# Patient Record
Sex: Female | Born: 1955 | Race: White | Hispanic: No | Marital: Married | Smoking: Former smoker
Health system: Southern US, Community
[De-identification: ages and names within clinical notes are randomized; demographics above are authoritative.]

## PROBLEM LIST (undated history)

## (undated) DIAGNOSIS — I739 Peripheral vascular disease, unspecified: Secondary | ICD-10-CM

## (undated) DIAGNOSIS — T7840XA Allergy, unspecified, initial encounter: Secondary | ICD-10-CM

## (undated) DIAGNOSIS — M069 Rheumatoid arthritis, unspecified: Secondary | ICD-10-CM

## (undated) DIAGNOSIS — F32A Depression, unspecified: Secondary | ICD-10-CM

## (undated) DIAGNOSIS — M810 Age-related osteoporosis without current pathological fracture: Secondary | ICD-10-CM

## (undated) DIAGNOSIS — F419 Anxiety disorder, unspecified: Secondary | ICD-10-CM

## (undated) DIAGNOSIS — J439 Emphysema, unspecified: Secondary | ICD-10-CM

## (undated) DIAGNOSIS — I10 Essential (primary) hypertension: Secondary | ICD-10-CM

## (undated) DIAGNOSIS — J449 Chronic obstructive pulmonary disease, unspecified: Secondary | ICD-10-CM

## (undated) DIAGNOSIS — G709 Myoneural disorder, unspecified: Secondary | ICD-10-CM

## (undated) DIAGNOSIS — T8859XA Other complications of anesthesia, initial encounter: Secondary | ICD-10-CM

## (undated) DIAGNOSIS — K219 Gastro-esophageal reflux disease without esophagitis: Secondary | ICD-10-CM

## (undated) DIAGNOSIS — E785 Hyperlipidemia, unspecified: Secondary | ICD-10-CM

## (undated) DIAGNOSIS — E039 Hypothyroidism, unspecified: Secondary | ICD-10-CM

## (undated) DIAGNOSIS — R251 Tremor, unspecified: Secondary | ICD-10-CM

## (undated) DIAGNOSIS — D689 Coagulation defect, unspecified: Secondary | ICD-10-CM

## (undated) DIAGNOSIS — F329 Major depressive disorder, single episode, unspecified: Secondary | ICD-10-CM

## (undated) HISTORY — DX: Depression, unspecified: F32.A

## (undated) HISTORY — DX: Chronic obstructive pulmonary disease, unspecified: J44.9

## (undated) HISTORY — PX: EYE SURGERY: SHX253

## (undated) HISTORY — DX: Emphysema, unspecified: J43.9

## (undated) HISTORY — DX: Hypothyroidism, unspecified: E03.9

## (undated) HISTORY — PX: KIDNEY STONE SURGERY: SHX686

## (undated) HISTORY — DX: Allergy, unspecified, initial encounter: T78.40XA

## (undated) HISTORY — DX: Hyperlipidemia, unspecified: E78.5

## (undated) HISTORY — DX: Rheumatoid arthritis, unspecified: M06.9

## (undated) HISTORY — DX: Coagulation defect, unspecified: D68.9

## (undated) HISTORY — PX: CATARACT EXTRACTION, BILATERAL: SHX1313

## (undated) HISTORY — DX: Anxiety disorder, unspecified: F41.9

## (undated) HISTORY — DX: Myoneural disorder, unspecified: G70.9

## (undated) HISTORY — DX: Essential (primary) hypertension: I10

## (undated) HISTORY — DX: Gastro-esophageal reflux disease without esophagitis: K21.9

## (undated) HISTORY — PX: KIDNEY CYST REMOVAL: SHX684

## (undated) HISTORY — DX: Age-related osteoporosis without current pathological fracture: M81.0

---
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
## 2007-05-24 DIAGNOSIS — C3490 Malignant neoplasm of unspecified part of unspecified bronchus or lung: Secondary | ICD-10-CM

## 2007-05-24 HISTORY — DX: Malignant neoplasm of unspecified part of unspecified bronchus or lung: C34.90

## 2007-12-23 DIAGNOSIS — C3490 Malignant neoplasm of unspecified part of unspecified bronchus or lung: Secondary | ICD-10-CM | POA: Insufficient documentation

## 2008-07-22 DIAGNOSIS — E559 Vitamin D deficiency, unspecified: Secondary | ICD-10-CM | POA: Insufficient documentation

## 2008-09-03 DIAGNOSIS — I6529 Occlusion and stenosis of unspecified carotid artery: Secondary | ICD-10-CM | POA: Insufficient documentation

## 2010-06-25 DIAGNOSIS — N200 Calculus of kidney: Secondary | ICD-10-CM | POA: Insufficient documentation

## 2012-02-21 DIAGNOSIS — G250 Essential tremor: Secondary | ICD-10-CM | POA: Insufficient documentation

## 2015-05-24 HISTORY — PX: FEMORAL BYPASS: SHX50

## 2016-10-19 DIAGNOSIS — I739 Peripheral vascular disease, unspecified: Secondary | ICD-10-CM | POA: Insufficient documentation

## 2017-02-10 DIAGNOSIS — I70229 Atherosclerosis of native arteries of extremities with rest pain, unspecified extremity: Secondary | ICD-10-CM | POA: Insufficient documentation

## 2018-01-02 DIAGNOSIS — M2550 Pain in unspecified joint: Secondary | ICD-10-CM | POA: Insufficient documentation

## 2018-05-07 DIAGNOSIS — M159 Polyosteoarthritis, unspecified: Secondary | ICD-10-CM | POA: Insufficient documentation

## 2019-03-12 ENCOUNTER — Encounter (HOSPITAL_COMMUNITY): Payer: Self-pay | Admitting: *Deleted

## 2019-03-12 NOTE — Progress Notes (Signed)
Oncology Navigator Note:  Patient was referred to our office by herself.  She was treated for lung cancer back 2009 by Dr. Delton Coombes and now has new concerns in her breast.  She wants him to continue caring for her.  I explained to patient how I will be involved with her care.  I provided information on their first visit and what to expect.  I made sure patient was aware of appointment time and directions to the cancer center.  My phone number was given so that she can call me with any questions or concerns.  She voices appreciation and understanding.  We have requested records from [facility] in [place], [place] and [facility] in [place].  Patient has also requested imaging be sent from [facility] in [place] as well as records from [facility] of [place].

## 2019-04-01 ENCOUNTER — Encounter (HOSPITAL_COMMUNITY): Payer: Self-pay | Admitting: Hematology

## 2019-04-01 ENCOUNTER — Inpatient Hospital Stay (HOSPITAL_COMMUNITY): Payer: Medicare Other | Attending: Hematology | Admitting: Hematology

## 2019-04-01 ENCOUNTER — Other Ambulatory Visit: Payer: Self-pay

## 2019-04-01 DIAGNOSIS — C50912 Malignant neoplasm of unspecified site of left female breast: Secondary | ICD-10-CM | POA: Insufficient documentation

## 2019-04-01 DIAGNOSIS — Z7982 Long term (current) use of aspirin: Secondary | ICD-10-CM

## 2019-04-01 DIAGNOSIS — M069 Rheumatoid arthritis, unspecified: Secondary | ICD-10-CM

## 2019-04-01 DIAGNOSIS — Z87891 Personal history of nicotine dependence: Secondary | ICD-10-CM | POA: Insufficient documentation

## 2019-04-01 DIAGNOSIS — M810 Age-related osteoporosis without current pathological fracture: Secondary | ICD-10-CM | POA: Diagnosis not present

## 2019-04-01 DIAGNOSIS — E785 Hyperlipidemia, unspecified: Secondary | ICD-10-CM | POA: Insufficient documentation

## 2019-04-01 DIAGNOSIS — C50412 Malignant neoplasm of upper-outer quadrant of left female breast: Secondary | ICD-10-CM | POA: Diagnosis present

## 2019-04-01 DIAGNOSIS — Z170 Estrogen receptor positive status [ER+]: Secondary | ICD-10-CM | POA: Diagnosis not present

## 2019-04-01 DIAGNOSIS — K219 Gastro-esophageal reflux disease without esophagitis: Secondary | ICD-10-CM

## 2019-04-01 DIAGNOSIS — Z79899 Other long term (current) drug therapy: Secondary | ICD-10-CM | POA: Insufficient documentation

## 2019-04-01 NOTE — Patient Instructions (Addendum)
[place] at [facility] Discharge Instructions  You were seen today by Dr. Delton Coombes. He went over your recent test results. You have Lobular Breast Cancer, this is a different type of cancer than you had before in your lung. He reviewed your history, family history and how you've been lately. He will refer you to Dr. Donne Hazel in [facility] for surgical evaluation. You will likely require an antiestrogen pill once you have surgery, you would need to be on this medication for at least 5 years. He will also discuss radiation with the radiation oncologist in [place]. He will see you back in 6 weeks for follow up.   Thank you for choosing [place] at [facility] to provide your oncology and hematology care.  To afford each patient quality time with our provider, please arrive at least 15 minutes before your scheduled appointment time.   If you have a lab appointment with the [facility] please come in thru the  Main Entrance and check in at the main information desk  You need to re-schedule your appointment should you arrive 10 or more minutes late.  We strive to give you quality time with our providers, and arriving late affects you and other patients whose appointments are after yours.  Also, if you no show three or more times for appointments you may be dismissed from the clinic at the providers discretion.     Again, thank you for choosing [facility]  Our hope is that these requests will decrease the amount of time that you wait before being seen by our physicians.       _____________________________________________________________  Should you have questions after your visit to [facility], please contact our office at [phone] between the hours of 8:00 a.m. and 4:30 p.m.  Voicemails left after 4:00 p.m. will not be returned until the following business day.  For prescription refill requests, have your  pharmacy contact our office and allow 72 hours.    Cancer Center Support Programs:   > Cancer Support Group  2nd Tuesday of the month 1pm-2pm, Journey Room

## 2019-04-01 NOTE — Assessment & Plan Note (Addendum)
1.  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 -Left breast mammogram on 02/12/2019 showed heterogeneously hypoechoic spiculated mass at 1230 measuring 12 x 8 x 11 mm. -Ultrasound-guided breast biopsy on 02/28/2019 at [facility] consistent with invasive mammary carcinoma with lobular features at 12:30 position, grade 1, ER positive, PR negative, HER-2 negative by FISH. -She was evaluated by Dr. Norberto Sorenson at [facility] surgical in [place].  She was recommended to have wire localized excision of the lesion.  She is here to see me for second opinion. -Clinically she does not have any palpable adenopathy.  I will make a referral to Dr. Donne Hazel for lumpectomy with or without SLNB. -I will see her back in 4 weeks after surgery to discuss further options. -She had radiation therapy in 2009 for her lung cancer.  We will reach out to radiation oncology in [place] to find out about her radiation doses and field.  If she cannot get further radiation to the left breast, she will be recommended mastectomy.  2.  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 -I reviewed CT of the chest dated 02/25/2019 which showed scarring in the right upper medial lung consistent with history of prior treatment.  3 cm left lower lobe lung cyst is unchanged.  No adenopathy.  3.  Rheumatoid arthritis: -She has been off of methotrexate.  She is currently on Plaquenil.  4.  Osteoporosis: -She received first injection of Prolia in September 2020. -She is also taking calcium and vitamin D supplements.

## 2019-04-01 NOTE — Progress Notes (Signed)
[facility] NOTE  Patient Care Team: [place], Jarvis Newcomer, MD as PCP - General (Family Medicine)  CHIEF COMPLAINTS/PURPOSE OF CONSULTATION:  Left breast lobular carcinoma.  HISTORY OF PRESENTING ILLNESS:  Christy Bennett 63 y.o. female is seen in consultation today for further work-up and management of newly diagnosed lobular carcinoma of the left breast.  She reportedly had abnormal mammogram in January 2020.  She subsequently had a left breast biopsy done at [facility] in [place].  This was reportedly benign.  She was being followed up closely with mammograms.  Left breast mammogram on 02/12/2019 showed heterogeneously hypoechoic spiculated mass at 12:30 position measuring 12 x 8 x 11 mm.  She was evaluated by Dr. Norberto Sorenson at [facility] surgical in [place].  She underwent ultrasound-guided left breast biopsy on 02/28/2019.  This was consistent with invasive mammary carcinoma with lobular features, ER positive, PR negative, HER-2 negative by FISH.  She was recommended to have a needle localized lumpectomy.  She was seen by me for a second opinion.  She reported intentional weight loss of 10 to 12 pounds in the last 6 months, when she made dietary changes.  She has chronic cough with no hemoptysis.  She attained menarche at age 66.  Menopause around age 54.  No prior history of hormone replacement therapy.  She used birth control pills for 5 to 10 years.  No previous breast biopsies.  She had a history of stage III right lung adenocarcinoma, treated with cisplatin and VP-16 with concurrent radiation therapy from February 18, 2008 through April 25, 2008.  She also has a history of rheumatoid arthritis and is on Plaquenil.  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  MEDICAL HISTORY:  Past Medical History:  Diagnosis Date  . Allergy   . COPD (chronic obstructive pulmonary disease) ([place])   . Depression   . GERD (gastroesophageal reflux disease)   . Hyperlipemia    . Hypertension   . Hypothyroid   . Lung cancer ([place]) 2009  . Osteoporosis   . RA (rheumatoid arthritis) ([place])     SURGICAL HISTORY: Past Surgical History:  Procedure Laterality Date  . CATARACT EXTRACTION, BILATERAL    . FEMORAL BYPASS Right 2017  . KIDNEY CYST REMOVAL      SOCIAL HISTORY: Social History   Socioeconomic History  . Marital status: Married    Spouse name: Dellis Filbert  . Number of children: Not on file  . Years of education: Not on file  . Highest education level: Not on file  Occupational History  . Occupation: retires  Scientific laboratory technician  . Financial resource strain: Not hard at all  . Food insecurity    Worry: Never true    Inability: Never true  . Transportation needs    Medical: No    Non-medical: No  Tobacco Use  . Smoking status: Former Smoker    Packs/day: 1.00    Years: 43.00    Pack years: 43.00    Types: Cigarettes    Quit date: 04/12/2015    Years since quitting: 3.9  . Smokeless tobacco: Never Used  Substance and Sexual Activity  . Alcohol use: Not Currently  . Drug use: Never  . Sexual activity: Not on file  Lifestyle  . Physical activity    Days per week: 7 days    Minutes per session: 60 min  . Stress: Only a little  Relationships  . Social Herbalist on phone:  Twice a week    Gets together: Twice a week    Attends religious service: More than 4 times per year    Active member of club or organization: No    Attends meetings of clubs or organizations: Never    Relationship status: Married  . Intimate partner violence    Fear of current or ex partner: No    Emotionally abused: No    Physically abused: No    Forced sexual activity: No  Other Topics Concern  . Not on file  Social History Narrative  . Not on file    FAMILY HISTORY: Family History  Problem Relation Age of Onset  . Diabetes Mother   . Hyperlipidemia Mother     ALLERGIES:  is allergic to amoxicillin-pot clavulanate.  MEDICATIONS:  Current  Outpatient Medications  Medication Sig Dispense Refill  . aspirin EC 81 MG tablet Take 81 mg by mouth daily.    . buPROPion (ZYBAN) 150 MG 12 hr tablet Take 150 mg by mouth 2 (two) times daily.    . Calcium Citrate-Vitamin D (CAL-CITRATE PLUS VITAMIN D PO) Take 650 mg by mouth daily. Pt two tablets once daily    . cetirizine (ZYRTEC) 10 MG tablet Take 10 mg by mouth daily.    . clopidogrel (PLAVIX) 75 MG tablet Take 75 mg by mouth daily.    . esomeprazole (HM ESOMEPRAZOLE MAGNESIUM DR) 20 MG capsule Take 20 mg by mouth 2 (two) times daily.    . folic acid (FOLVITE) 1 MG tablet Take 1 mg by mouth 2 (two) times daily.    . hydroxychloroquine (PLAQUENIL) 200 MG tablet Take 250 mg by mouth daily. Pt 1 1/2 tablet daily    . levothyroxine (SYNTHROID) 150 MCG tablet Take 150 mcg by mouth daily before breakfast.    . losartan (COZAAR) 25 MG tablet Take 25 mg by mouth daily.    . simvastatin (ZOCOR) 10 MG tablet Take 10 mg by mouth daily.    . sulfaSALAzine (AZULFIDINE) 500 MG tablet Take 500 mg by mouth 3 (three) times daily.    . umeclidinium-vilanterol (ANORO ELLIPTA) 62.5-25 MCG/INH AEPB Inhale 1 puff into the lungs daily.    . verapamil (CALAN-SR) 240 MG CR tablet Take 240 mg by mouth daily.    . vitamin C (ASCORBIC ACID) 500 MG tablet Take 500 mg by mouth as needed.    . vitamin E 400 UNIT capsule Take 400 Units by mouth daily. Pt takes 450 mg daily    . albuterol (VENTOLIN HFA) 108 (90 Base) MCG/ACT inhaler Inhale 2 puffs into the lungs as needed for wheezing or shortness of breath.    . docusate sodium ([employer] STOOL SOFTENER) 50 MG capsule Take 50 mg by mouth as needed for mild constipation.     No current facility-administered medications for this visit.     REVIEW OF SYSTEMS:   Constitutional: Denies fevers, chills or abnormal night sweats Eyes: Denies blurriness of vision, double vision or watery eyes Ears, nose, mouth, throat, and face: Denies mucositis or sore throat Respiratory: Dyspnea  on exertion present.  Chronic cough. Cardiovascular: Denies palpitation, chest discomfort or lower extremity swelling Gastrointestinal:  Denies nausea, heartburn or change in bowel habits.  Occasional constipation.   Skin: Denies abnormal skin rashes Lymphatics: Denies new lymphadenopathy or easy bruising Neurological:Denies numbness, tingling or new weaknesses Behavioral/Psych: Mood is stable, no new changes  All other systems were reviewed with the patient and are negative.  PHYSICAL EXAMINATION: ECOG PERFORMANCE   STATUS: 1 - Symptomatic but completely ambulatory  Vitals:   04/01/19 0848  BP: 139/65  Pulse: 67  Resp: 18  Temp: 97.9 F (36.6 C)  SpO2: 97%   Filed Weights   04/01/19 0848  Weight: 126 lb 6.4 oz (57.3 kg)    GENERAL:alert, no distress and comfortable SKIN: skin color, texture, turgor are normal, no rashes or significant lesions EYES: normal, conjunctiva are pink and non-injected, sclera clear OROPHARYNX:no exudate, no erythema and lips, buccal mucosa, and tongue normal  NECK: supple, thyroid normal size, non-tender, without nodularity LYMPH:  no palpable lymphadenopathy in the cervical, axillary or inguinal LUNGS: clear to auscultation and percussion with normal breathing effort HEART: regular rate & rhythm and no murmurs and no lower extremity edema ABDOMEN:abdomen soft, non-tender and normal bowel sounds Musculoskeletal:no cyanosis of digits and no clubbing  PSYCH: alert & oriented x 3 with fluent speech NEURO: no focal motor/sensory deficits Breast examination did not reveal any significant palpable masses.  No palpable adenopathy.  LABORATORY DATA:  I have reviewed the data as listed No results found for: WBC, HGB, HCT, MCV, PLT   Chemistry   No results found for: NA, K, CL, CO2, BUN, CREATININE, GLU No results found for: CALCIUM, ALKPHOS, AST, ALT, BILITOT     RADIOGRAPHIC STUDIES: I have personally reviewed the radiological images as listed and  agreed with the findings in the report.  ASSESSMENT & PLAN:  Breast cancer, left ([place]) 1.  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 -Left breast mammogram on 02/12/2019 showed heterogeneously hypoechoic spiculated mass at 1230 measuring 12 x 8 x 11 mm. -Ultrasound-guided breast biopsy on 02/28/2019 at [facility] consistent with invasive mammary carcinoma with lobular features at 12:30 position, grade 1, ER positive, PR negative, HER-2 negative by FISH. -She was evaluated by Dr. Norberto Sorenson at [facility] surgical in [place].  She was recommended to have wire localized excision of the lesion.  She is here to see me for second opinion. -Clinically she does not have any palpable adenopathy.  I will make a referral to Dr. Donne Hazel for lumpectomy with or without SLNB. -I will see her back in 4 weeks after surgery to discuss further options. -She had radiation therapy in 2009 for her lung cancer.  We will reach out to radiation oncology in [place] to find out about her radiation doses and field.  If she cannot get further radiation to the left breast, she will be recommended mastectomy.  2.  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 -I reviewed CT of the chest dated 02/25/2019 which showed scarring in the right upper medial lung consistent with history of prior treatment.  3 cm left lower lobe lung cyst is unchanged.  No adenopathy.  3.  Rheumatoid arthritis: -She has been off of methotrexate.  She is currently on Plaquenil.  4.  Osteoporosis: -She received first injection of Prolia in September 2020. -She is also taking calcium and vitamin D supplements.  No orders of the defined types were placed in this  encounter.   All questions were answered. The patient knows to call the clinic with any problems, questions or concerns.     Derek Jack, MD 04/01/2019 6:27 PM

## 2019-04-02 ENCOUNTER — Encounter (HOSPITAL_COMMUNITY): Payer: Self-pay | Admitting: Lab

## 2019-04-02 NOTE — Progress Notes (Unsigned)
Referral sent to [facility] for Dr Donne Hazel.  Records faxed on 11/10

## 2019-04-10 ENCOUNTER — Other Ambulatory Visit: Payer: Self-pay | Admitting: General Surgery

## 2019-04-10 DIAGNOSIS — C50412 Malignant neoplasm of upper-outer quadrant of left female breast: Secondary | ICD-10-CM

## 2019-04-11 ENCOUNTER — Other Ambulatory Visit: Payer: Self-pay

## 2019-04-11 ENCOUNTER — Encounter (HOSPITAL_BASED_OUTPATIENT_CLINIC_OR_DEPARTMENT_OTHER): Payer: Self-pay | Admitting: *Deleted

## 2019-04-12 ENCOUNTER — Other Ambulatory Visit: Payer: Self-pay | Admitting: General Surgery

## 2019-04-12 DIAGNOSIS — Z170 Estrogen receptor positive status [ER+]: Secondary | ICD-10-CM

## 2019-04-12 DIAGNOSIS — C50412 Malignant neoplasm of upper-outer quadrant of left female breast: Secondary | ICD-10-CM

## 2019-04-16 ENCOUNTER — Encounter (HOSPITAL_COMMUNITY)
Admission: RE | Admit: 2019-04-16 | Discharge: 2019-04-16 | Disposition: A | Discharge status: Home / Self Care | Payer: Medicare Other | Source: Ambulatory Visit | Attending: General Surgery | Admitting: General Surgery

## 2019-04-16 ENCOUNTER — Other Ambulatory Visit: Payer: Self-pay

## 2019-04-16 DIAGNOSIS — Z01818 Encounter for other preprocedural examination: Secondary | ICD-10-CM | POA: Insufficient documentation

## 2019-04-16 DIAGNOSIS — I10 Essential (primary) hypertension: Secondary | ICD-10-CM | POA: Insufficient documentation

## 2019-04-19 ENCOUNTER — Inpatient Hospital Stay (HOSPITAL_COMMUNITY): Admission: RE | Admit: 2019-04-19 | Payer: Medicare Other | Source: Ambulatory Visit

## 2019-04-20 ENCOUNTER — Other Ambulatory Visit (HOSPITAL_COMMUNITY)
Admission: RE | Admit: 2019-04-20 | Discharge: 2019-04-20 | Disposition: A | Discharge status: Home / Self Care | Payer: Medicare Other | Source: Ambulatory Visit | Attending: General Surgery | Admitting: General Surgery

## 2019-04-20 DIAGNOSIS — Z20828 Contact with and (suspected) exposure to other viral communicable diseases: Secondary | ICD-10-CM | POA: Diagnosis not present

## 2019-04-20 DIAGNOSIS — Z01812 Encounter for preprocedural laboratory examination: Secondary | ICD-10-CM | POA: Diagnosis present

## 2019-04-20 LAB — SARS CORONAVIRUS 2 (TAT 6-24 HRS): SARS Coronavirus 2: NEGATIVE

## 2019-04-22 ENCOUNTER — Ambulatory Visit
Admission: RE | Admit: 2019-04-22 | Discharge: 2019-04-22 | Disposition: A | Discharge status: Home / Self Care | Payer: Medicare Other | Source: Ambulatory Visit | Attending: General Surgery | Admitting: General Surgery

## 2019-04-22 ENCOUNTER — Other Ambulatory Visit: Payer: Self-pay

## 2019-04-22 DIAGNOSIS — Z170 Estrogen receptor positive status [ER+]: Secondary | ICD-10-CM

## 2019-04-22 DIAGNOSIS — C50412 Malignant neoplasm of upper-outer quadrant of left female breast: Secondary | ICD-10-CM

## 2019-04-23 ENCOUNTER — Encounter (HOSPITAL_BASED_OUTPATIENT_CLINIC_OR_DEPARTMENT_OTHER): Payer: Self-pay

## 2019-04-23 ENCOUNTER — Ambulatory Visit (HOSPITAL_BASED_OUTPATIENT_CLINIC_OR_DEPARTMENT_OTHER): Payer: Medicare Other | Admitting: Anesthesiology

## 2019-04-23 ENCOUNTER — Other Ambulatory Visit: Payer: Self-pay

## 2019-04-23 ENCOUNTER — Ambulatory Visit
Admission: RE | Admit: 2019-04-23 | Discharge: 2019-04-23 | Disposition: A | Discharge status: Home / Self Care | Payer: Medicare Other | Source: Ambulatory Visit | Attending: General Surgery | Admitting: General Surgery

## 2019-04-23 ENCOUNTER — Encounter (HOSPITAL_COMMUNITY)
Admission: RE | Admit: 2019-04-23 | Discharge: 2019-04-23 | Disposition: A | Discharge status: Home / Self Care | Payer: Medicare Other | Source: Ambulatory Visit | Attending: General Surgery | Admitting: General Surgery

## 2019-04-23 ENCOUNTER — Encounter (HOSPITAL_BASED_OUTPATIENT_CLINIC_OR_DEPARTMENT_OTHER)
Admission: RE | Disposition: A | Discharge status: Home / Self Care | Payer: Self-pay | Source: Home / Self Care | Attending: General Surgery

## 2019-04-23 ENCOUNTER — Ambulatory Visit (HOSPITAL_BASED_OUTPATIENT_CLINIC_OR_DEPARTMENT_OTHER)
Admission: RE | Admit: 2019-04-23 | Discharge: 2019-04-23 | Disposition: A | Discharge status: Home / Self Care | Payer: Medicare Other | Attending: General Surgery | Admitting: General Surgery

## 2019-04-23 DIAGNOSIS — Z7902 Long term (current) use of antithrombotics/antiplatelets: Secondary | ICD-10-CM | POA: Insufficient documentation

## 2019-04-23 DIAGNOSIS — K219 Gastro-esophageal reflux disease without esophagitis: Secondary | ICD-10-CM | POA: Diagnosis not present

## 2019-04-23 DIAGNOSIS — C50412 Malignant neoplasm of upper-outer quadrant of left female breast: Secondary | ICD-10-CM

## 2019-04-23 DIAGNOSIS — E039 Hypothyroidism, unspecified: Secondary | ICD-10-CM | POA: Diagnosis not present

## 2019-04-23 DIAGNOSIS — Z9221 Personal history of antineoplastic chemotherapy: Secondary | ICD-10-CM | POA: Diagnosis not present

## 2019-04-23 DIAGNOSIS — Z85118 Personal history of other malignant neoplasm of bronchus and lung: Secondary | ICD-10-CM | POA: Insufficient documentation

## 2019-04-23 DIAGNOSIS — Z79890 Hormone replacement therapy (postmenopausal): Secondary | ICD-10-CM | POA: Diagnosis not present

## 2019-04-23 DIAGNOSIS — Z87891 Personal history of nicotine dependence: Secondary | ICD-10-CM | POA: Insufficient documentation

## 2019-04-23 DIAGNOSIS — E7800 Pure hypercholesterolemia, unspecified: Secondary | ICD-10-CM | POA: Insufficient documentation

## 2019-04-23 DIAGNOSIS — J439 Emphysema, unspecified: Secondary | ICD-10-CM | POA: Diagnosis not present

## 2019-04-23 DIAGNOSIS — I10 Essential (primary) hypertension: Secondary | ICD-10-CM | POA: Diagnosis not present

## 2019-04-23 DIAGNOSIS — F329 Major depressive disorder, single episode, unspecified: Secondary | ICD-10-CM | POA: Insufficient documentation

## 2019-04-23 DIAGNOSIS — I739 Peripheral vascular disease, unspecified: Secondary | ICD-10-CM | POA: Diagnosis not present

## 2019-04-23 DIAGNOSIS — N6489 Other specified disorders of breast: Secondary | ICD-10-CM | POA: Diagnosis not present

## 2019-04-23 DIAGNOSIS — F419 Anxiety disorder, unspecified: Secondary | ICD-10-CM | POA: Diagnosis not present

## 2019-04-23 DIAGNOSIS — N6082 Other benign mammary dysplasias of left breast: Secondary | ICD-10-CM | POA: Insufficient documentation

## 2019-04-23 DIAGNOSIS — Z170 Estrogen receptor positive status [ER+]: Secondary | ICD-10-CM | POA: Insufficient documentation

## 2019-04-23 DIAGNOSIS — Z923 Personal history of irradiation: Secondary | ICD-10-CM | POA: Diagnosis not present

## 2019-04-23 DIAGNOSIS — Z880 Allergy status to penicillin: Secondary | ICD-10-CM | POA: Diagnosis not present

## 2019-04-23 DIAGNOSIS — C50919 Malignant neoplasm of unspecified site of unspecified female breast: Secondary | ICD-10-CM

## 2019-04-23 DIAGNOSIS — Z79899 Other long term (current) drug therapy: Secondary | ICD-10-CM | POA: Insufficient documentation

## 2019-04-23 DIAGNOSIS — N62 Hypertrophy of breast: Secondary | ICD-10-CM | POA: Diagnosis not present

## 2019-04-23 DIAGNOSIS — M069 Rheumatoid arthritis, unspecified: Secondary | ICD-10-CM | POA: Insufficient documentation

## 2019-04-23 DIAGNOSIS — Z7982 Long term (current) use of aspirin: Secondary | ICD-10-CM | POA: Diagnosis not present

## 2019-04-23 DIAGNOSIS — M810 Age-related osteoporosis without current pathological fracture: Secondary | ICD-10-CM | POA: Insufficient documentation

## 2019-04-23 HISTORY — DX: Peripheral vascular disease, unspecified: I73.9

## 2019-04-23 HISTORY — PX: BREAST LUMPECTOMY WITH RADIOACTIVE SEED AND SENTINEL LYMPH NODE BIOPSY: SHX6550

## 2019-04-23 HISTORY — PX: BREAST LUMPECTOMY: SHX2

## 2019-04-23 HISTORY — DX: Malignant neoplasm of unspecified site of unspecified female breast: C50.919

## 2019-04-23 SURGERY — BREAST LUMPECTOMY WITH RADIOACTIVE SEED AND SENTINEL LYMPH NODE BIOPSY
Anesthesia: General | Site: Breast | Laterality: Left

## 2019-04-23 MED ORDER — FENTANYL CITRATE (PF) 100 MCG/2ML IJ SOLN
INTRAMUSCULAR | Status: AC
  Filled 2019-04-23: qty 2

## 2019-04-23 MED ORDER — DEXAMETHASONE SODIUM PHOSPHATE 4 MG/ML IJ SOLN
INTRAMUSCULAR | Status: DC | PRN
  Administered 2019-04-23: 5 mg via INTRAVENOUS

## 2019-04-23 MED ORDER — LIDOCAINE HCL (CARDIAC) PF 100 MG/5ML IV SOSY
PREFILLED_SYRINGE | INTRAVENOUS | Status: DC | PRN
  Administered 2019-04-23: 30 mg via INTRAVENOUS

## 2019-04-23 MED ORDER — BUPIVACAINE-EPINEPHRINE (PF) 0.5% -1:200000 IJ SOLN
INTRAMUSCULAR | Status: DC | PRN
  Administered 2019-04-23: 30 mL via PERINEURAL

## 2019-04-23 MED ORDER — MIDAZOLAM HCL 2 MG/2ML IJ SOLN
INTRAMUSCULAR | Status: AC
  Filled 2019-04-23: qty 2

## 2019-04-23 MED ORDER — EPHEDRINE SULFATE 50 MG/ML IJ SOLN
INTRAMUSCULAR | Status: DC | PRN
  Administered 2019-04-23: 10 mg via INTRAVENOUS

## 2019-04-23 MED ORDER — FENTANYL CITRATE (PF) 100 MCG/2ML IJ SOLN
50.0000 ug | INTRAMUSCULAR | Status: DC | PRN
  Administered 2019-04-23 (×2): 50 ug via INTRAVENOUS

## 2019-04-23 MED ORDER — FENTANYL CITRATE (PF) 100 MCG/2ML IJ SOLN: 25.0000 ug | INTRAMUSCULAR | Status: DC | PRN

## 2019-04-23 MED ORDER — ACETAMINOPHEN 500 MG PO TABS
1000.0000 mg | ORAL_TABLET | ORAL | Status: AC
  Administered 2019-04-23: 1000 mg via ORAL

## 2019-04-23 MED ORDER — DIPHENHYDRAMINE HCL 50 MG/ML IJ SOLN
INTRAMUSCULAR | Status: AC
  Filled 2019-04-23: qty 1

## 2019-04-23 MED ORDER — ACETAMINOPHEN 500 MG PO TABS
ORAL_TABLET | ORAL | Status: AC
  Filled 2019-04-23: qty 2

## 2019-04-23 MED ORDER — LACTATED RINGERS IV SOLN
INTRAVENOUS | Status: DC
  Administered 2019-04-23 (×2): via INTRAVENOUS

## 2019-04-23 MED ORDER — ENSURE PRE-SURGERY PO LIQD: 296.0000 mL | Freq: Once | ORAL | Status: DC

## 2019-04-23 MED ORDER — CIPROFLOXACIN IN D5W 400 MG/200ML IV SOLN
INTRAVENOUS | Status: AC
  Filled 2019-04-23: qty 200

## 2019-04-23 MED ORDER — FENTANYL CITRATE (PF) 100 MCG/2ML IJ SOLN
INTRAMUSCULAR | Status: AC
  Filled 2019-04-23: qty 4

## 2019-04-23 MED ORDER — BUPIVACAINE HCL (PF) 0.25 % IJ SOLN
INTRAMUSCULAR | Status: DC | PRN
  Administered 2019-04-23: 8 mL

## 2019-04-23 MED ORDER — PROPOFOL 500 MG/50ML IV EMUL
INTRAVENOUS | Status: DC | PRN
  Administered 2019-04-23: 25 ug/kg/min via INTRAVENOUS

## 2019-04-23 MED ORDER — CIPROFLOXACIN IN D5W 400 MG/200ML IV SOLN
400.0000 mg | INTRAVENOUS | Status: AC
  Administered 2019-04-23: 400 mg via INTRAVENOUS

## 2019-04-23 MED ORDER — PROPOFOL 10 MG/ML IV BOLUS
INTRAVENOUS | Status: DC | PRN
  Administered 2019-04-23: 100 mg via INTRAVENOUS

## 2019-04-23 MED ORDER — EPHEDRINE 5 MG/ML INJ
INTRAVENOUS | Status: AC
  Filled 2019-04-23: qty 30

## 2019-04-23 MED ORDER — ONDANSETRON HCL 4 MG/2ML IJ SOLN: 4.0000 mg | Freq: Once | INTRAMUSCULAR | Status: DC | PRN

## 2019-04-23 MED ORDER — GABAPENTIN 100 MG PO CAPS
ORAL_CAPSULE | ORAL | Status: AC
  Filled 2019-04-23: qty 1

## 2019-04-23 MED ORDER — DIPHENHYDRAMINE HCL 50 MG/ML IJ SOLN
12.5000 mg | Freq: Once | INTRAMUSCULAR | Status: AC
  Administered 2019-04-23: 12.5 mg via INTRAVENOUS

## 2019-04-23 MED ORDER — PHENYLEPHRINE 40 MCG/ML (10ML) SYRINGE FOR IV PUSH (FOR BLOOD PRESSURE SUPPORT)
PREFILLED_SYRINGE | INTRAVENOUS | Status: AC
  Filled 2019-04-23: qty 10

## 2019-04-23 MED ORDER — OXYCODONE HCL 5 MG PO TABS: 5.0000 mg | ORAL_TABLET | Freq: Four times a day (QID) | ORAL | 0 refills | Status: DC | PRN

## 2019-04-23 MED ORDER — ONDANSETRON HCL 4 MG/2ML IJ SOLN
INTRAMUSCULAR | Status: DC | PRN
  Administered 2019-04-23: 4 mg via INTRAVENOUS

## 2019-04-23 MED ORDER — TECHNETIUM TC 99M SULFUR COLLOID FILTERED
1.0000 | Freq: Once | INTRAVENOUS | Status: AC | PRN
  Administered 2019-04-23: 09:00:00 1 via INTRADERMAL

## 2019-04-23 MED ORDER — MIDAZOLAM HCL 2 MG/2ML IJ SOLN
1.0000 mg | INTRAMUSCULAR | Status: DC | PRN
  Administered 2019-04-23 (×2): 1 mg via INTRAVENOUS

## 2019-04-23 MED ORDER — GABAPENTIN 100 MG PO CAPS
100.0000 mg | ORAL_CAPSULE | ORAL | Status: AC
  Administered 2019-04-23: 100 mg via ORAL

## 2019-04-23 MED ORDER — CLONIDINE HCL (ANALGESIA) 100 MCG/ML EP SOLN
EPIDURAL | Status: DC | PRN
  Administered 2019-04-23: 50 ug

## 2019-04-23 SURGICAL SUPPLY — 58 items
APPLIER CLIP 9.375 MED OPEN (MISCELLANEOUS) ×3
BINDER BREAST LRG (GAUZE/BANDAGES/DRESSINGS) IMPLANT
BINDER BREAST MEDIUM (GAUZE/BANDAGES/DRESSINGS) IMPLANT
BINDER BREAST XLRG (GAUZE/BANDAGES/DRESSINGS) IMPLANT
BINDER BREAST XXLRG (GAUZE/BANDAGES/DRESSINGS) IMPLANT
BLADE SURG 15 STRL LF DISP TIS (BLADE) ×1 IMPLANT
BLADE SURG 15 STRL SS (BLADE) ×2
CANISTER SUC SOCK COL 7IN (MISCELLANEOUS) IMPLANT
CANISTER SUCT 1200ML W/VALVE (MISCELLANEOUS) IMPLANT
CHLORAPREP W/TINT 26 (MISCELLANEOUS) ×3 IMPLANT
CLIP APPLIE 9.375 MED OPEN (MISCELLANEOUS) ×1 IMPLANT
CLIP VESOCCLUDE SM WIDE 6/CT (CLIP) IMPLANT
CLOSURE WOUND 1/2 X4 (GAUZE/BANDAGES/DRESSINGS) ×1
COVER BACK TABLE REUSABLE LG (DRAPES) ×3 IMPLANT
COVER MAYO STAND REUSABLE (DRAPES) ×3 IMPLANT
COVER PROBE W GEL 5X96 (DRAPES) ×3 IMPLANT
COVER WAND RF STERILE (DRAPES) IMPLANT
DECANTER SPIKE VIAL GLASS SM (MISCELLANEOUS) IMPLANT
DERMABOND ADVANCED (GAUZE/BANDAGES/DRESSINGS) ×2
DERMABOND ADVANCED .7 DNX12 (GAUZE/BANDAGES/DRESSINGS) ×1 IMPLANT
DRAPE LAPAROSCOPIC ABDOMINAL (DRAPES) ×3 IMPLANT
DRAPE UTILITY XL STRL (DRAPES) ×3 IMPLANT
ELECT COATED BLADE 2.86 ST (ELECTRODE) ×3 IMPLANT
ELECT REM PT RETURN 9FT ADLT (ELECTROSURGICAL) ×3
ELECTRODE REM PT RTRN 9FT ADLT (ELECTROSURGICAL) ×1 IMPLANT
GLOVE BIO SURGEON STRL SZ7 (GLOVE) ×6 IMPLANT
GLOVE BIOGEL PI IND STRL 7.5 (GLOVE) ×1 IMPLANT
GLOVE BIOGEL PI INDICATOR 7.5 (GLOVE) ×2
GOWN STRL REUS W/ TWL LRG LVL3 (GOWN DISPOSABLE) ×2 IMPLANT
GOWN STRL REUS W/TWL LRG LVL3 (GOWN DISPOSABLE) ×4
HEMOSTAT ARISTA ABSORB 3G PWDR (HEMOSTASIS) IMPLANT
ILLUMINATOR WAVEGUIDE N/F (MISCELLANEOUS) ×3 IMPLANT
KIT MARKER MARGIN INK (KITS) ×3 IMPLANT
LIGHT WAVEGUIDE WIDE FLAT (MISCELLANEOUS) IMPLANT
NDL SAFETY ECLIPSE 18X1.5 (NEEDLE) IMPLANT
NEEDLE HYPO 18GX1.5 SHARP (NEEDLE)
NEEDLE HYPO 25X1 1.5 SAFETY (NEEDLE) ×3 IMPLANT
NS IRRIG 1000ML POUR BTL (IV SOLUTION) ×3 IMPLANT
PACK BASIN DAY SURGERY FS (CUSTOM PROCEDURE TRAY) ×3 IMPLANT
PENCIL SMOKE EVACUATOR (MISCELLANEOUS) ×3 IMPLANT
SLEEVE SCD COMPRESS KNEE MED (MISCELLANEOUS) ×3 IMPLANT
SPONGE LAP 4X18 RFD (DISPOSABLE) ×3 IMPLANT
STRIP CLOSURE SKIN 1/2X4 (GAUZE/BANDAGES/DRESSINGS) ×2 IMPLANT
SUT ETHILON 2 0 FS 18 (SUTURE) IMPLANT
SUT MNCRL AB 4-0 PS2 18 (SUTURE) ×3 IMPLANT
SUT MON AB 5-0 PS2 18 (SUTURE) ×3 IMPLANT
SUT SILK 2 0 SH (SUTURE) ×3 IMPLANT
SUT VIC AB 2-0 SH 27 (SUTURE) ×6
SUT VIC AB 2-0 SH 27XBRD (SUTURE) ×3 IMPLANT
SUT VIC AB 3-0 SH 27 (SUTURE) ×6
SUT VIC AB 3-0 SH 27X BRD (SUTURE) ×3 IMPLANT
SUT VIC AB 5-0 PS2 18 (SUTURE) IMPLANT
SYR CONTROL 10ML LL (SYRINGE) ×3 IMPLANT
TOWEL GREEN STERILE FF (TOWEL DISPOSABLE) ×3 IMPLANT
TRAY FAXITRON CT DISP (TRAY / TRAY PROCEDURE) ×3 IMPLANT
TUBE CONNECTING 20'X1/4 (TUBING) ×1
TUBE CONNECTING 20X1/4 (TUBING) ×2 IMPLANT
YANKAUER SUCT BULB TIP NO VENT (SUCTIONS) ×3 IMPLANT

## 2019-04-23 NOTE — Anesthesia Postprocedure Evaluation (Signed)
Anesthesia Post Note  Patient: [place] Devin  Procedure(s) Performed: LEFT BREAST LUMPECTOMY WITH RADIOACTIVE SEED AND LEFT AXILLARY SENTINEL LYMPH NODE BIOPSY (Left Breast)     Patient location during evaluation: PACU Anesthesia Type: General Level of consciousness: awake and alert Pain management: pain level controlled Vital Signs Assessment: post-procedure vital signs reviewed and stable Respiratory status: spontaneous breathing, nonlabored ventilation and respiratory function stable Cardiovascular status: blood pressure returned to baseline and stable Postop Assessment: no apparent nausea or vomiting Anesthetic complications: no    Last Vitals:  Vitals:   04/23/19 1200 04/23/19 1215  BP: (!) 108/58 114/90  Pulse: 73 84  Resp: 14 18  Temp:  36.6 C  SpO2: 98% 95%    Last Pain:  Vitals:   04/23/19 1215  TempSrc: Oral  PainSc: 0-No pain                 Catalina Gravel

## 2019-04-23 NOTE — Discharge Instructions (Signed)
Post Anesthesia Home Care Instructions  Activity: Get plenty of rest for the remainder of the day. A responsible individual must stay with you for 24 hours following the procedure.  For the next 24 hours, DO NOT: -Drive a car -Paediatric nurse -Drink alcoholic beverages -Take any medication unless instructed by your physician -Make any legal decisions or sign important papers.  Meals: Start with liquid foods such as gelatin or soup. Progress to regular foods as tolerated. Avoid greasy, spicy, heavy foods. If nausea and/or vomiting occur, drink only clear liquids until the nausea and/or vomiting subsides. Call your physician if vomiting continues.  Special Instructions/Symptoms: Your throat may feel dry or sore from the anesthesia or the breathing tube placed in your throat during surgery. If this causes discomfort, gargle with warm salt water. The discomfort should disappear within 24 hours.  If you had a scopolamine patch placed behind your ear for the management of post- operative nausea and/or vomiting:  1. The medication in the patch is effective for 72 hours, after which it should be removed.  Wrap patch in a tissue and discard in the trash. Wash hands thoroughly with soap and water. 2. You may remove the patch earlier than 72 hours if you experience unpleasant side effects which may include dry mouth, dizziness or visual disturbances. 3. Avoid touching the patch. Wash your hands with soap and water after contact with the patch.       [place] Office Phone Number [phone] POST OP INSTRUCTIONS Take 400 mg of ibuprofen every 8 hours or 650 mg tylenol every 6 hours for next 72 hours then as needed. Use ice several times daily also. Always review your discharge instruction sheet given to you by the facility where your surgery was performed.  IF YOU HAVE DISABILITY OR FAMILY LEAVE FORMS, YOU MUST BRING THEM TO THE OFFICE FOR PROCESSING.  DO NOT GIVE THEM TO  YOUR DOCTOR.  1. A prescription for pain medication may be given to you upon discharge.  Take your pain medication as prescribed, if needed.  If narcotic pain medicine is not needed, then you may take acetaminophen (Tylenol), naprosyn (Alleve) or ibuprofen (Advil) as needed. 2. Take your usually prescribed medications unless otherwise directed 3. If you need a refill on your pain medication, please contact your pharmacy.  They will contact our office to request authorization.  Prescriptions will not be filled after 5pm or on week-ends. 4. You should eat very light the first 24 hours after surgery, such as soup, crackers, pudding, etc.  Resume your normal diet the day after surgery. 5. Most patients will experience some swelling and bruising in the breast.  Ice packs and a good support bra will help.  Wear the breast binder provided or a sports bra for 72 hours day and night.  After that wear a sports bra during the day until you return to the office. Swelling and bruising can take several days to resolve.  6. It is common to experience some constipation if taking pain medication after surgery.  Increasing fluid intake and taking a stool softener will usually help or prevent this problem from occurring.  A mild laxative (Milk of Magnesia or Miralax) should be taken according to package directions if there are no bowel movements after 48 hours. 7. Unless discharge instructions indicate otherwise, you may remove your bandages 48 hours after surgery and you may shower at that time.  You may have steri-strips (small skin tapes) in place directly over the  incision.  These strips should be left on the skin for 7-10 days and will come off on their own.  If your surgeon used skin glue on the incision, you may shower in 24 hours.  The glue will flake off over the next 2-3 weeks.  Any sutures or staples will be removed at the office during your follow-up visit. 8. ACTIVITIES:  You may resume regular daily activities  (gradually increasing) beginning the next day.  Wearing a good support bra or sports bra minimizes pain and swelling.  You may have sexual intercourse when it is comfortable. a. You may drive when you no longer are taking prescription pain medication, you can comfortably wear a seatbelt, and you can safely maneuver your car and apply brakes. b. RETURN TO WORK:  ______________________________________________________________________________________ 9. You should see your doctor in the office for a follow-up appointment approximately two weeks after your surgery.  Your doctors nurse will typically make your follow-up appointment when she calls you with your pathology report.  Expect your pathology report 3-4 business days after your surgery.  You may call to check if you do not hear from [place] after three days. 10. OTHER INSTRUCTIONS: _______________________________________________________________________________________________ _____________________________________________________________________________________________________________________________________ _____________________________________________________________________________________________________________________________________ _____________________________________________________________________________________________________________________________________  WHEN TO CALL DR WAKEFIELD: 1. Fever over 101.0 2. Nausea and/or vomiting. 3. Extreme swelling or bruising. 4. Continued bleeding from incision. 5. Increased pain, redness, or drainage from the incision.  The clinic staff is available to answer your questions during regular business hours.  Please dont hesitate to call and ask to speak to one of the nurses for clinical concerns.  If you have a medical emergency, go to the nearest emergency room or call 911.  A surgeon from [facility] is always on call at the hospital.  For further questions, please visit  [email or web address]

## 2019-04-23 NOTE — Op Note (Signed)
Preoperative diagnosis: Clinical stage I left breast cancer Postoperative diagnosis: Same as above Procedure: 1.  Left breast radioactive seed guided lumpectomy 2.  Left deep axillary sentinel lymph node biopsy Surgeon: Dr. Serita Grammes Anesthesia: General with a pectoral block Complications: None Drains: None Specimens: 1.  Left deep axillary lymph nodes 2.  Left breast radioactive seed guided lumpectomy marked with paint 3. Additional left breast posterior and lateral margins marked short superior, long lateral double deep Sponge needle count was correct at completion Disposition recovery stable condition  Indications: 1 yof who had some right breast pain in january of 2020 and was evaluated in [place]. this was negative. she then underwent further evaluation later in the year in [place]. her last mm in 9/20 shows a mass at 1230 that measures 12x8x11 mm confirmed on [place]. 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 She is able to receive radiotherapy.  We discussed options and elected to proceed with lumpectomy and sn biopsy.   Procedure: The patient first had a radioactive seed placed at the breast center.  I had these mammograms available in the operating room.  After informed consent was obtained she was taken to the operating room.  She was given a pectoral block.  Antibiotics were administered.  SCDs were in place.  She was placed under general anesthesia without complication.  She was prepped and draped in the standard sterile surgical fashion.  Surgical timeout was then performed.  I first removed the breast cancer.   I infiltrated Marcaine and made a periareolar incision in order to hide the scar later.   I tunneled using the lighted retractor to the seed and the surrounding tissue.  I removed the radioactive seed as well as the surrounding tissue in it with an attempt to get a clear margin.  I then painted the tissue.  I then did a 3D  mammogram that appeared that all my margins were clear.  Mammogram confirmed removal of the clips as well as the radioactive seed. I did remove additional margins that I thought might be close as above. I placed clips in the cavity. I then closed the breast tissue with 2-0 vicryl. The skin was closed with 3-0 vicryl and 5-0 monocryl. Glue and steristrips were applied I then infiltrated marcaine in the skin and made an incision below the axillary hairline. I carried this through the axillary fascia.   I was able to identify several sentinel lymph nodes that were radioactive.  The highest count was 108.  Once I removed these there were no other palpable nodes and no more radioactivity.  The was were passed off the table.  Hemostasis was observed.  I closed the axilla with 2-0 Vicryl suture.  the skin was closed with 3-0 vicryl and 4-0 monocryl. Glue and steristrips were applied.  A binder was placed. She was extubated and transferred to recovery stable

## 2019-04-23 NOTE — Progress Notes (Signed)
Assisted Eddie, nuc med tech, with nuc med injections. Side rails up, monitors on throughout procedure. See vital signs in flow sheet. Tolerated Procedure well.

## 2019-04-23 NOTE — Anesthesia Procedure Notes (Signed)
Anesthesia Regional Block: Pectoralis block   Pre-Anesthetic Checklist: ,, timeout performed, Correct Patient, Correct Site, Correct Laterality, Correct Procedure, Correct Position, site marked, Risks and benefits discussed,  Surgical consent,  Pre-op evaluation,  At surgeon's request and post-op pain management  Laterality: Left  Prep: chloraprep       Needles:  Injection technique: Single-shot  Needle Type: Echogenic Needle     Needle Length: 9cm  Needle Gauge: 21     Additional Needles:   Procedures:,,,, ultrasound used (permanent image in chart),,,,  Narrative:  Start time: 04/23/2019 9:03 AM End time: 04/23/2019 9:13 AM Injection made incrementally with aspirations every 5 mL.  Performed by: Personally  Anesthesiologist: Catalina Gravel, MD  Additional Notes: No pain on injection. No increased resistance to injection. Injection made in 5cc increments.  Good needle visualization.  Patient tolerated procedure well.

## 2019-04-23 NOTE — Interval H&P Note (Signed)
History and Physical Interval Note:  04/23/2019 9:54 AM  Christy Bennett  has presented today for surgery, with the diagnosis of left breast cancer.  The various methods of treatment have been discussed with the patient and family. After consideration of risks, benefits and other options for treatment, the patient has consented to  Procedure(s): LEFT BREAST LUMPECTOMY WITH RADIOACTIVE SEED AND LEFT AXILLARY SENTINEL LYMPH NODE BIOPSY (Left) as a surgical intervention.  The patient's history has been reviewed, patient examined, no change in status, stable for surgery.  I have reviewed the patient's chart and labs.  Questions were answered to the patient's satisfaction.     Rolm Bookbinder

## 2019-04-23 NOTE — Progress Notes (Signed)
Assisted Dr. Gifford Shave with left, ultrasound guided, pectoralis block. Side rails up, monitors on throughout procedure. See vital signs in flow sheet. Tolerated Procedure well.

## 2019-04-23 NOTE — Anesthesia Procedure Notes (Signed)
Procedure Name: LMA Insertion Date/Time: 04/23/2019 10:08 AM Performed by: Signe Colt, CRNA Pre-anesthesia Checklist: Patient identified, Emergency Drugs available, Suction available and Patient being monitored Patient Re-evaluated:Patient Re-evaluated prior to induction Oxygen Delivery Method: Circle system utilized Preoxygenation: Pre-oxygenation with 100% oxygen Induction Type: IV induction Ventilation: Mask ventilation without difficulty LMA: LMA inserted LMA Size: 4.0 Number of attempts: 1 Airway Equipment and Method: Bite block Placement Confirmation: positive ETCO2 Tube secured with: Tape Dental Injury: Teeth and Oropharynx as per pre-operative assessment

## 2019-04-23 NOTE — Transfer of Care (Signed)
Immediate Anesthesia Transfer of Care Note  Patient: Christy Bennett  Procedure(s) Performed: LEFT BREAST LUMPECTOMY WITH RADIOACTIVE SEED AND LEFT AXILLARY SENTINEL LYMPH NODE BIOPSY (Left Breast)  Patient Location: PACU  Anesthesia Type:GA combined with regional for post-op pain  Level of Consciousness: drowsy and patient cooperative  Airway & Oxygen Therapy: Patient Spontanous Breathing and Patient connected to face mask oxygen  Post-op Assessment: Report given to RN and Post -op Vital signs reviewed and stable  Post vital signs: Reviewed and stable  Last Vitals:  Vitals Value Taken Time  BP    Temp    Pulse 71 04/23/19 1115  Resp 16 04/23/19 1115  SpO2 100 % 04/23/19 1115  Vitals shown include unvalidated device data.  Last Pain:  Vitals:   04/23/19 0833  TempSrc: Oral  PainSc: 0-No pain         Complications: No apparent anesthesia complications

## 2019-04-23 NOTE — H&P (Signed)
63 yof referred by Dr Delton Coombes for new left breast cancer. she has no family history of breast or ovarian cancer. she has no prior breast history. she lives in [place] with her husband. she has previously been treated with chemo/rads for lung cancer by Dr Delton Coombes in 2009 and is ned from that. she has no mass or dc. she had some right breast pain in january of 2020 and was evaluated in [place]. this was negative. she then underwent further evaluation later in the year in [place]. her last mm in 9/20 shows a mass at 1230 that measures 12x8x11 mm confirmed on [place]. this underwent biopsy and is a er pos, pr neg, her 2 neg [place] that is grade I. 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 on plavix and asa per her vascular Psychologist, sport and exercise. has been off of these in past for biopsies.   Past Surgical History (Tanisha A. Owens Shark, [place]; 04/05/2019 12:11 PM) Breast Biopsy  Left. Bypass Surgery for Poor Blood Flow to Legs  Cataract Surgery  Bilateral.  Diagnostic Studies History (Tanisha A. Owens Shark, [place]; 04/05/2019 12:11 PM) Colonoscopy  >10 years ago Mammogram  within last year Pap Smear  1-5 years ago  Allergies (Tanisha A. Owens Shark, RMA; 04/05/2019 12:12 PM) Augmentin *PENICILLINS*  Diarrhea. Allergies Reconciled   Medication History (Tanisha A. Owens Shark, [place]; 04/05/2019 12:17 PM) Plaquenil (200MG  Tablet, Oral) Active. Clopidogrel Bisulfate (75MG  Tablet, Oral) Active. Losartan Potassium (25MG  Tablet, Oral) Active. ZyrTEC Allergy (10MG  Tablet, Oral) Active. ProAir Digihaler (108MCG/ACT Aero Pow Br Act, Inhalation) Active. Prolia (60MG /ML Solution, Subcutaneous) Active. Folic Acid (1MG  Tablet, Oral) Active. sulfaSALAzine (500MG  Tablet, Oral) Active. Vitamin C (500MG  Tablet, Oral) Active. Verapamil HCl ER (240MG  Tablet ER, Oral) Active. Stool Softener (Oral) Specific strength  unknown - Active. Simvastatin (10MG  Tablet, Oral) Active. Vitamin E (Oral) Specific strength unknown - Active. Anoro Ellipta (62.5-25MCG/INH Aero Pow Br Act, Inhalation) Active. traZODone HCl (100MG  Tablet, Oral) Active. Citracal +D3 (250-107-500MG -MG-UNIT Tablet Chewable, Oral) Active. Aspirin (81MG  Tablet, Oral) Active. NexIUM (20MG  Capsule DR, Oral) Active. Wellbutrin SR (150MG  Tablet ER 12HR, Oral) Active. Medications Reconciled  Social History (Tanisha A. Owens Shark, [place]; 04/05/2019 12:11 PM) Alcohol use  Occasional alcohol use. Caffeine use  Carbonated beverages. No drug use  Tobacco use  Former smoker.  Family History (Tanisha A. Owens Shark, [place]; 04/05/2019 12:11 PM) Arthritis  Mother. Diabetes Mellitus  Mother. Heart Disease  Mother. Hypertension  Mother. Kidney Disease  Mother. Migraine Headache  Sister. Respiratory Condition  Sister. Seizure disorder  Sister.  Pregnancy / Birth History (Tanisha A. Owens Shark, RMA; 04/05/2019 12:11 PM) Age at menarche  52 years. Age of menopause  51-53 Contraceptive History  Oral contraceptives. Gravida  2 Maternal age  76-25 Para  74  Other Problems (Tanisha A. Owens Shark, [place]; 04/05/2019 12:11 PM) Anxiety Disorder  Arthritis  Back Pain  Chronic Obstructive Lung Disease  Depression  Emphysema Of Lung  Gastroesophageal Reflux Disease  Hemorrhoids  High blood pressure  Hypercholesterolemia  Kidney Stone  Lung Cancer  Melanoma  Thyroid Disease  Transfusion history  Vascular Disease    Review of Systems (Tanisha A. Brown RMA; 04/05/2019 12:11 PM) General Not Present- Appetite Loss, Chills, Fatigue, Fever, Night Sweats, Weight Gain and Weight Loss. Skin Present- Dryness. Not Present- Change in Wart/Mole, Hives, Jaundice, New Lesions, Non-Healing Wounds, Rash and Ulcer. HEENT Present- Hearing Loss, Seasonal Allergies and Wears glasses/contact  lenses. Not Present- Earache, Hoarseness, Nose Bleed, Oral  Ulcers, Ringing in the Ears, Sinus Pain, Sore Throat, Visual Disturbances and Yellow Eyes. Breast Present- Breast Mass. Not Present- Breast Pain, Nipple Discharge and Skin Changes. Cardiovascular Present- Leg Cramps. Not Present- Chest Pain, Difficulty Breathing Lying Down, Palpitations, Rapid Heart Rate, Shortness of Breath and Swelling of Extremities. Gastrointestinal Present- Constipation. Not Present- Abdominal Pain, Bloating, Bloody Stool, Change in Bowel Habits, Chronic diarrhea, Difficulty Swallowing, Excessive gas, Gets full quickly at meals, Hemorrhoids, Indigestion, Nausea, Rectal Pain and Vomiting. Female Genitourinary Not Present- Frequency, Nocturia, Painful Urination, Pelvic Pain and Urgency. Musculoskeletal Present- Joint Pain. Not Present- Back Pain, Joint Stiffness, Muscle Pain, Muscle Weakness and Swelling of Extremities. Neurological Present- Tremor. Not Present- Decreased Memory, Fainting, Headaches, Numbness, Seizures, Tingling, Trouble walking and Weakness. Psychiatric Present- Depression. Not Present- Anxiety, Bipolar, Change in Sleep Pattern, Fearful and Frequent crying. Endocrine Present- Hair Changes. Not Present- Cold Intolerance, Excessive Hunger, Heat Intolerance, Hot flashes and New Diabetes. Hematology Present- Blood Thinners, Easy Bruising and Gland problems. Not Present- Excessive bleeding, HIV and Persistent Infections.  Vitals (Tanisha A. Brown RMA; 04/05/2019 12:12 PM) 04/05/2019 12:11 PM Weight: 126.6 lb Height: 62in Body Surface Area: 1.57 m Body Mass Index: 23.16 kg/m  Temp.: 97.35F  Pulse: 82 (Regular)  BP: 126/84 (Sitting, Left Arm, Standard)   Physical Exam Rolm Bookbinder MD; 04/05/2019 2:03 PM) General Mental Status-Alert. Orientation-Oriented X3. Head and Neck Trachea-midline. Thyroid Gland Characteristics - normal size and consistency. Eye Sclera/Conjunctiva - Bilateral-No scleral icterus. Chest and Lung Exam Chest  and lung exam reveals -quiet, even and easy respiratory effort with no use of accessory muscles. Breast Nipples-No Discharge. Breast Lump-No Palpable Breast Mass. Abdomen Note: soft nt/nd no hm Neurologic Neurologic evaluation reveals -alert and oriented x 3 with no impairment of recent or remote memory. Lymphatic Head & Neck General Head & Neck Lymphatics: Bilateral - Description - Normal. Axillary General Axillary Region: Bilateral - Description - Normal. Note: no [place] adenopathy   Assessment & Plan Rolm Bookbinder MD; 04/05/2019 2:18 PM) LOBULAR CARCINOMA OF LEFT BREAST, STAGE 1 (C50.912) left breast lumpectomy, sn biopsy  will await determination on if she can receive radiotherapy for this tumor. I discussed staging and pathophysiology of breast cancer with all available treatment options. I do think sentinel node biopsy at age 48 for [place] is reasonable given information it could provide for adjuvant therapy. we discussed performance with Tc99. we discussed small risk of lymphedema postoperatively as well as shoulder dysfunction I dont think she needs MRI with this ILC. It is small. we discussed lumpectomy vs mastectomy. I think she is good candidate for lumpectomy but radiotherapy is a question. we discussed both surgeries today. I think even if she cannot receive radiotherapy I would be willing to proceed with lumpectomy followed by antiestrogens at age 69. with some other comorbidities I think this would be reasonable and she is agreeable. we did discuss that if node was positive she might have to have additional surgery. I am going to await [place] and discuss with Dr Delton Coombes then proceed. will need to be off plavix for surgery, not asa

## 2019-04-23 NOTE — Anesthesia Preprocedure Evaluation (Addendum)
Anesthesia Evaluation  Patient identified by MRN, date of birth, ID band Patient awake    Reviewed: Allergy & Precautions, NPO status , Patient's Chart, lab work & pertinent test results  Airway Mallampati: II  TM Distance: >3 FB Neck ROM: Full    Dental  (+) Teeth Intact, Dental Advisory Given   Pulmonary COPD, former smoker,  H/o lung cancer   Pulmonary exam normal breath sounds clear to auscultation       Cardiovascular hypertension, Pt. on medications + Peripheral Vascular Disease (s/p femoral bypass)  Normal cardiovascular exam Rhythm:Regular Rate:Normal     Neuro/Psych PSYCHIATRIC DISORDERS Depression negative neurological ROS     GI/Hepatic Neg liver ROS, GERD  Medicated,  Endo/Other  Hypothyroidism   Renal/GU negative Renal ROS     Musculoskeletal  (+) Arthritis , Rheumatoid disorders,    Abdominal   Peds  Hematology  (+) Blood dyscrasia (Plavix), ,   Anesthesia Other Findings Day of surgery medications reviewed with the patient.  Left breast cancer   Reproductive/Obstetrics                             Anesthesia Physical Anesthesia Plan  ASA: III  Anesthesia Plan: General   Post-op Pain Management:  Regional for Post-op pain   Induction: Intravenous  PONV Risk Score and Plan: 3 and Midazolam, Dexamethasone and Ondansetron  Airway Management Planned: LMA  Additional Equipment:   Intra-op Plan:   Post-operative Plan: Extubation in OR  Informed Consent: I have reviewed the patients History and Physical, chart, labs and discussed the procedure including the risks, benefits and alternatives for the proposed anesthesia with the patient or authorized representative who has indicated his/her understanding and acceptance.     Dental advisory given  Plan Discussed with: CRNA  Anesthesia Plan Comments:         Anesthesia Quick Evaluation

## 2019-04-24 ENCOUNTER — Encounter (HOSPITAL_BASED_OUTPATIENT_CLINIC_OR_DEPARTMENT_OTHER): Payer: Self-pay | Admitting: General Surgery

## 2019-04-25 ENCOUNTER — Other Ambulatory Visit: Payer: Self-pay

## 2019-04-29 LAB — SURGICAL PATHOLOGY

## 2019-05-13 ENCOUNTER — Other Ambulatory Visit: Payer: Self-pay

## 2019-05-14 ENCOUNTER — Inpatient Hospital Stay (HOSPITAL_COMMUNITY): Payer: Medicare Other | Attending: Hematology | Admitting: Hematology

## 2019-05-14 ENCOUNTER — Encounter (HOSPITAL_COMMUNITY): Payer: Self-pay | Admitting: Hematology

## 2019-05-14 VITALS — BP 139/55 | HR 64 | Temp 97.2°F | Resp 18 | Wt 125.5 lb

## 2019-05-14 DIAGNOSIS — K219 Gastro-esophageal reflux disease without esophagitis: Secondary | ICD-10-CM | POA: Insufficient documentation

## 2019-05-14 DIAGNOSIS — C50412 Malignant neoplasm of upper-outer quadrant of left female breast: Secondary | ICD-10-CM | POA: Insufficient documentation

## 2019-05-14 DIAGNOSIS — Z170 Estrogen receptor positive status [ER+]: Secondary | ICD-10-CM | POA: Diagnosis not present

## 2019-05-14 DIAGNOSIS — Z7982 Long term (current) use of aspirin: Secondary | ICD-10-CM | POA: Diagnosis not present

## 2019-05-14 DIAGNOSIS — E785 Hyperlipidemia, unspecified: Secondary | ICD-10-CM | POA: Diagnosis not present

## 2019-05-14 DIAGNOSIS — Z79899 Other long term (current) drug therapy: Secondary | ICD-10-CM | POA: Diagnosis not present

## 2019-05-14 DIAGNOSIS — E039 Hypothyroidism, unspecified: Secondary | ICD-10-CM | POA: Insufficient documentation

## 2019-05-14 DIAGNOSIS — Z79811 Long term (current) use of aromatase inhibitors: Secondary | ICD-10-CM | POA: Diagnosis not present

## 2019-05-14 DIAGNOSIS — I10 Essential (primary) hypertension: Secondary | ICD-10-CM | POA: Diagnosis not present

## 2019-05-14 DIAGNOSIS — K5900 Constipation, unspecified: Secondary | ICD-10-CM | POA: Diagnosis not present

## 2019-05-14 MED ORDER — ANASTROZOLE 1 MG PO TABS: 1.0000 mg | ORAL_TABLET | Freq: Every day | ORAL | 3 refills | Status: DC

## 2019-05-14 NOTE — Progress Notes (Signed)
Christy Bennett, Christy Bennett [id number]   CLINIC:  Medical Oncology/Hematology  PCP:  Christy Bennett, [place] [id number] [phone]   REASON FOR VISIT:  Follow-up for left breast cancer.  CURRENT THERAPY: Anastrozole.  BRIEF ONCOLOGIC HISTORY:  Oncology History  Breast cancer, left ([place])  04/01/2019 Initial Diagnosis   Breast cancer, left ([place])   05/14/2019 Cancer Staging   Staging form: Breast, AJCC 8th Edition - Clinical stage from 05/14/2019: Stage IA (cT1c, cN0, cM0, G1, ER+, PR-, HER2-) - Signed by Christy Jack, MD on 05/14/2019      CANCER STAGING: Cancer Staging Breast cancer, left ([place]) Staging form: Breast, AJCC 8th Edition - Clinical stage from 05/14/2019: Stage IA (cT1c, cN0, cM0, G1, ER+, PR-, HER2-) - Signed by Christy Jack, MD on 05/14/2019    INTERVAL HISTORY:  Christy Bennett 63 y.o. female seen for follow-up after lumpectomy for her left breast invasive lobular carcinoma.  She underwent lumpectomy on 04/23/2019.  She had seroma aspirated couple of times, last time 1 week ago.  She has mild soreness in the left breast.  Otherwise recovered very well from surgery.  Denies any fevers or chills.  Denies any nausea, vomiting or diarrhea.  Baseline constipation is stable.    REVIEW OF SYSTEMS:  Review of Systems  Gastrointestinal: Positive for constipation.  All other systems reviewed and are negative.    PAST MEDICAL/SURGICAL HISTORY:  Past Medical History:  Diagnosis Date  . Allergy   . COPD (chronic obstructive pulmonary disease) ([place])   . Depression   . GERD (gastroesophageal reflux disease)   . Hyperlipemia   . Hypertension   . Hypothyroid   . Lung cancer ([place]) 2009  . Osteoporosis   . PVD (peripheral vascular disease) ([place])   . RA (rheumatoid arthritis) ([place])    Past Surgical History:  Procedure Laterality Date  . BREAST LUMPECTOMY WITH RADIOACTIVE SEED AND  SENTINEL LYMPH NODE BIOPSY Left 04/23/2019   Procedure: LEFT BREAST LUMPECTOMY WITH RADIOACTIVE SEED AND LEFT AXILLARY SENTINEL LYMPH NODE BIOPSY;  Surgeon: Christy Bookbinder, MD;  Location: [facility];  Service: General;  Laterality: Left;  . CATARACT EXTRACTION, BILATERAL    . FEMORAL BYPASS Right 2017   x3  . KIDNEY CYST REMOVAL       SOCIAL HISTORY:  Social History   Socioeconomic History  . Marital status: Married    Spouse name: Christy Bennett  . Number of children: Not on file  . Years of education: Not on file  . Highest education level: Not on file  Occupational History  . Occupation: retires  Tobacco Use  . Smoking status: Former Smoker    Packs/day: 1.00    Years: 43.00    Pack years: 43.00    Types: Cigarettes    Quit date: 04/12/2015    Years since quitting: 4.0  . Smokeless tobacco: Never Used  Substance and Sexual Activity  . Alcohol use: Not Currently  . Drug use: Never  . Sexual activity: Not on file  Other Topics Concern  . Not on file  Social History Narrative  . Not on file   Social Determinants of Health   Financial Resource Strain: Low Risk   . Difficulty of Paying Living Expenses: Not hard at all  Food Insecurity: No Food Insecurity  . Worried About Charity fundraiser in the Last Year: Never true  . Ran Out of Food in the Last Year: Never true  Transportation Needs: No Transportation Needs  . Lack of Transportation (Medical): No  . Lack of Transportation (Non-Medical): No  Physical Activity: Sufficiently Active  . Days of Exercise per Week: 7 days  . Minutes of Exercise per Session: 60 min  Stress: No Stress Concern Present  . Feeling of Stress : Only a little  Social Connections: Slightly Isolated  . Frequency of Communication with Friends and Family: Twice a week  . Frequency of Social Gatherings with Friends and Family: Twice a week  . Attends Religious Services: More than 4 times per year  . Active Member of Clubs or  Organizations: No  . Attends Archivist Meetings: Never  . Marital Status: Married  Human resources officer Violence: Not At Risk  . Fear of Current or Ex-Partner: No  . Emotionally Abused: No  . Physically Abused: No  . Sexually Abused: No    FAMILY HISTORY:  Family History  Problem Relation Age of Onset  . Diabetes Mother   . Hyperlipidemia Mother     CURRENT MEDICATIONS:  Outpatient Encounter Medications as of 05/14/2019  Medication Sig  . aspirin EC 81 MG tablet Take 81 mg by mouth daily.  Marland Kitchen buPROPion (ZYBAN) 150 MG 12 hr tablet Take 150 mg by mouth 2 (two) times daily.  . Calcium Citrate-Vitamin D (CAL-CITRATE PLUS VITAMIN D PO) Take 650 mg by mouth daily. Pt two tablets once daily  . cetirizine (ZYRTEC) 10 MG tablet Take 10 mg by mouth daily.  . clopidogrel (PLAVIX) 75 MG tablet Take 75 mg by mouth daily.  Marland Kitchen denosumab (PROLIA) 60 MG/ML SOSY injection Inject 60 mg into the skin every 6 (six) months.  . esomeprazole (HM ESOMEPRAZOLE MAGNESIUM DR) 20 MG capsule Take 20 mg by mouth 2 (two) times daily.  . folic acid (FOLVITE) 1 MG tablet Take 1 mg by mouth 2 (two) times daily.  . hydroxychloroquine (PLAQUENIL) 200 MG tablet Take 250 mg by mouth daily. Pt 1 1/2 tablet daily  . levothyroxine (SYNTHROID) 150 MCG tablet Take 150 mcg by mouth daily before breakfast.  . losartan (COZAAR) 25 MG tablet Take 25 mg by mouth daily.  . simvastatin (ZOCOR) 10 MG tablet Take 10 mg by mouth daily.  Marland Kitchen sulfaSALAzine (AZULFIDINE) 500 MG tablet Take 500 mg by mouth 3 (three) times daily.  . traZODone (DESYREL) 100 MG tablet Take 100 mg by mouth at bedtime.  Marland Kitchen umeclidinium-vilanterol (ANORO ELLIPTA) 62.5-25 MCG/INH AEPB Inhale 1 puff into the lungs daily.  . verapamil (CALAN-SR) 240 MG CR tablet Take 240 mg by mouth daily.  . vitamin E 400 UNIT capsule Take 400 Units by mouth daily. Pt takes 450 mg daily  . albuterol (VENTOLIN HFA) 108 (90 Base) MCG/ACT inhaler Inhale 2 puffs into the lungs as  needed for wheezing or shortness of breath.  . anastrozole (ARIMIDEX) 1 MG tablet Take 1 tablet (1 mg total) by mouth daily.  Marland Kitchen docusate sodium ([employer] STOOL SOFTENER) 50 MG capsule Take 50 mg by mouth as needed for mild constipation.  . vitamin C (ASCORBIC ACID) 500 MG tablet Take 500 mg by mouth as needed.  . [DISCONTINUED] oxyCODONE (OXY IR/ROXICODONE) 5 MG immediate release tablet Take 1 tablet (5 mg total) by mouth every 6 (six) hours as needed for moderate pain, severe pain or breakthrough pain.   No facility-administered encounter medications on file as of 05/14/2019.    ALLERGIES:  Allergies  Allergen Reactions  . Amoxicillin-Pot Clavulanate Nausea Only and Nausea And Vomiting  Diarrhea, abdominal pain      PHYSICAL EXAM:  ECOG Performance status: 1  Vitals:   05/14/19 1449  BP: (!) 139/55  Pulse: 64  Resp: 18  Temp: (!) 97.2 F (36.2 C)  SpO2: 96%   Filed Weights   05/14/19 1449  Weight: 125 lb 8 oz (56.9 kg)    Physical Exam Vitals reviewed.  Constitutional:      Appearance: Normal appearance.  Cardiovascular:     Rate and Rhythm: Normal rate and regular rhythm.     Heart sounds: Normal heart sounds.  Pulmonary:     Effort: Pulmonary effort is normal.     Breath sounds: Normal breath sounds.  Abdominal:     General: There is no distension.     Palpations: Abdomen is soft. There is no mass.  Lymphadenopathy:     Cervical: No cervical adenopathy.  Skin:    General: Skin is warm.  Neurological:     General: No focal deficit present.     Mental Status: She is alert and oriented to person, place, and time.  Psychiatric:        Mood and Affect: Mood normal.        Behavior: Behavior normal.    Left lumpectomy site is well-healed around the periareolar region.  LABORATORY DATA:  I have reviewed the labs as listed.  CBC No results found for: WBC, RBC, HGB, HCT, PLT, MCV, MCH, MCHC, RDW, LYMPHSABS, MONOABS, EOSABS, BASOSABS No flowsheet data found.      DIAGNOSTIC IMAGING:  I have independently reviewed the scans and discussed with the patient.    ASSESSMENT & PLAN:   Breast cancer, left ([place]) 1.  Stage I (T1CN0) invasive lobular carcinoma of the left breast: -Biopsy on 02/28/2019 at [facility] consistent with invasive lobular carcinoma at 12:30 position, grade 1, ER positive, PR negative and HER-2 negative by FISH. -Lumpectomy and sentinel lymph node biopsy on 04/23/2019 by Dr. Donne Hazel shows grade 1, 1.5 cm invasive lobular carcinoma, margins negative, 1 sentinel lymph node negative.  ER is more than 90% positive, PR negative, HER-2 negative, Ki-67 not done.  Pathological staging pT1c, PN 0.  She had seroma aspirated couple of times after surgery. -I have recommended antiestrogen therapy with anastrozole for at least 5 years. -We discussed side effects of anastrozole including but not limited to hot flashes, musculoskeletal symptoms, decreased bone mineral density, rare incidence of thromboembolic reactions. -She will be referred to Christy [place] to discuss radiation therapy option. -I plan to see her back in 4 months for follow-up.  She will continue yearly mammograms.  2.  Stage III (T3 N2 M0) non-small cell lung cancer, adenocarcinoma type: -She was treated with chemoradiation therapy with cisplatin and VP-16 from 02/18/2008 through 04/25/2008. -Last CT of the chest dated 02/25/2019 showed scarring in the right upper medial lung consistent with history of prior treatment.  3 cm left lower lobe lung cyst is unchanged.  No adenopathy.  3.  Osteoporosis: -She received first injection of Prolia in September 2020 at the infusion center in [place].  This was prescribed under the direction of her rheumatologist. -She will continue calcium and vitamin D supplements.  I will check vitamin D level at next visit.  4.  Rheumatoid arthritis: -She has been off of methotrexate.  She is currently on Plaquenil.       Orders placed this encounter:  Orders Placed This Encounter  Procedures  . CBC with Differential/Platelet  . Comprehensive  metabolic panel      Sreedhar Katragadda, MD [facility] [phone]  

## 2019-05-14 NOTE — Patient Instructions (Addendum)
[facility] at [facility] Discharge Instructions  You were seen today by Dr. Delton Coombes. He went over your recent test results. He recommends that you talk with the radiation doctor to see if radiation is needed. He will send in a new prescription to your pharmacy, you will need to take this daily at the same time every day. He will see you back in 4 months for labs and follow up.   Thank you for choosing [place] at [facility] to provide your oncology and hematology care.  To afford each patient quality time with our provider, please arrive at least 15 minutes before your scheduled appointment time.   If you have a lab appointment with the [facility] please come in thru the  Main Entrance and check in at the main information desk  You need to re-schedule your appointment should you arrive 10 or more minutes late.  We strive to give you quality time with our providers, and arriving late affects you and other patients whose appointments are after yours.  Also, if you no show three or more times for appointments you may be dismissed from the clinic at the providers discretion.     Again, thank you for choosing [facility].  Our hope is that these requests will decrease the amount of time that you wait before being seen by our physicians.       _____________________________________________________________  Should you have questions after your visit to [facility], please contact our office at [phone] between the hours of 8:00 a.m. and 4:30 p.m.  Voicemails left after 4:00 p.m. will not be returned until the following business day.  For prescription refill requests, have your pharmacy contact our office and allow 72 hours.    Cancer Center Support Programs:   > Cancer Support Group  2nd Tuesday of the month 1pm-2pm, Journey Room

## 2019-05-14 NOTE — Assessment & Plan Note (Signed)
1.  Stage I (T1CN0) invasive lobular carcinoma of the left breast: -Biopsy on 02/28/2019 at [facility] consistent with invasive lobular carcinoma at 12:30 position, grade 1, ER positive, PR negative and HER-2 negative by FISH. -Lumpectomy and sentinel lymph node biopsy on 04/23/2019 by Dr. Donne Hazel shows grade 1, 1.5 cm invasive lobular carcinoma, margins negative, 1 sentinel lymph node negative.  ER is more than 90% positive, PR negative, HER-2 negative, Ki-67 not done.  Pathological staging pT1c, PN 0.  She had seroma aspirated couple of times after surgery. -I have recommended antiestrogen therapy with anastrozole for at least 5 years. -We discussed side effects of anastrozole including but not limited to hot flashes, musculoskeletal symptoms, decreased bone mineral density, rare incidence of thromboembolic reactions. -She will be referred to Dr. Venia Minks in [place] to discuss radiation therapy option. -I plan to see her back in 4 months for follow-up.  She will continue yearly mammograms.  2.  Stage III (T3 N2 M0) non-small cell lung cancer, adenocarcinoma type: -She was treated with chemoradiation therapy with cisplatin and VP-16 from 02/18/2008 through 04/25/2008. -Last CT of the chest dated 02/25/2019 showed scarring in the right upper medial lung consistent with history of prior treatment.  3 cm left lower lobe lung cyst is unchanged.  No adenopathy.  3.  Osteoporosis: -She received first injection of Prolia in September 2020 at the infusion center in [place].  This was prescribed under the direction of her rheumatologist. -She will continue calcium and vitamin D supplements.  I will check vitamin D level at next visit.  4.  Rheumatoid arthritis: -She has been off of methotrexate.  She is currently on Plaquenil.

## 2019-09-12 ENCOUNTER — Encounter (HOSPITAL_COMMUNITY): Payer: Self-pay | Admitting: Hematology

## 2019-09-12 ENCOUNTER — Inpatient Hospital Stay (HOSPITAL_COMMUNITY): Payer: Medicare Other

## 2019-09-12 ENCOUNTER — Other Ambulatory Visit: Payer: Self-pay

## 2019-09-12 ENCOUNTER — Inpatient Hospital Stay (HOSPITAL_COMMUNITY): Payer: Medicare Other | Attending: Hematology | Admitting: Hematology

## 2019-09-12 VITALS — BP 130/63 | HR 73 | Temp 97.0°F | Resp 19 | Wt 128.5 lb

## 2019-09-12 DIAGNOSIS — E039 Hypothyroidism, unspecified: Secondary | ICD-10-CM | POA: Insufficient documentation

## 2019-09-12 DIAGNOSIS — F329 Major depressive disorder, single episode, unspecified: Secondary | ICD-10-CM | POA: Insufficient documentation

## 2019-09-12 DIAGNOSIS — Z7982 Long term (current) use of aspirin: Secondary | ICD-10-CM | POA: Diagnosis not present

## 2019-09-12 DIAGNOSIS — C50912 Malignant neoplasm of unspecified site of left female breast: Secondary | ICD-10-CM | POA: Diagnosis not present

## 2019-09-12 DIAGNOSIS — J449 Chronic obstructive pulmonary disease, unspecified: Secondary | ICD-10-CM | POA: Insufficient documentation

## 2019-09-12 DIAGNOSIS — C50412 Malignant neoplasm of upper-outer quadrant of left female breast: Secondary | ICD-10-CM | POA: Diagnosis not present

## 2019-09-12 DIAGNOSIS — Z170 Estrogen receptor positive status [ER+]: Secondary | ICD-10-CM | POA: Insufficient documentation

## 2019-09-12 DIAGNOSIS — Z79899 Other long term (current) drug therapy: Secondary | ICD-10-CM | POA: Diagnosis not present

## 2019-09-12 DIAGNOSIS — M069 Rheumatoid arthritis, unspecified: Secondary | ICD-10-CM | POA: Insufficient documentation

## 2019-09-12 DIAGNOSIS — Z8349 Family history of other endocrine, nutritional and metabolic diseases: Secondary | ICD-10-CM | POA: Diagnosis not present

## 2019-09-12 DIAGNOSIS — K219 Gastro-esophageal reflux disease without esophagitis: Secondary | ICD-10-CM | POA: Diagnosis not present

## 2019-09-12 DIAGNOSIS — E785 Hyperlipidemia, unspecified: Secondary | ICD-10-CM | POA: Insufficient documentation

## 2019-09-12 DIAGNOSIS — Z85118 Personal history of other malignant neoplasm of bronchus and lung: Secondary | ICD-10-CM | POA: Diagnosis not present

## 2019-09-12 DIAGNOSIS — K5900 Constipation, unspecified: Secondary | ICD-10-CM | POA: Diagnosis not present

## 2019-09-12 DIAGNOSIS — I10 Essential (primary) hypertension: Secondary | ICD-10-CM | POA: Diagnosis not present

## 2019-09-12 DIAGNOSIS — Z87891 Personal history of nicotine dependence: Secondary | ICD-10-CM | POA: Diagnosis not present

## 2019-09-12 DIAGNOSIS — Z79811 Long term (current) use of aromatase inhibitors: Secondary | ICD-10-CM | POA: Insufficient documentation

## 2019-09-12 DIAGNOSIS — Z833 Family history of diabetes mellitus: Secondary | ICD-10-CM | POA: Diagnosis not present

## 2019-09-12 LAB — CBC WITH DIFFERENTIAL/PLATELET
Abs Immature Granulocytes: 0.01 10*3/uL (ref 0.00–0.07)
Basophils Absolute: 0.1 10*3/uL (ref 0.0–0.1)
Basophils Relative: 1 %
Eosinophils Absolute: 0.2 10*3/uL (ref 0.0–0.5)
Eosinophils Relative: 3 %
HCT: 36.2 % (ref 36.0–46.0)
Hemoglobin: 11.3 g/dL — ABNORMAL LOW (ref 12.0–15.0)
Immature Granulocytes: 0 %
Lymphocytes Relative: 18 %
Lymphs Abs: 1.2 10*3/uL (ref 0.7–4.0)
MCH: 28.5 pg (ref 26.0–34.0)
MCHC: 31.2 g/dL (ref 30.0–36.0)
MCV: 91.4 fL (ref 80.0–100.0)
Monocytes Absolute: 0.6 10*3/uL (ref 0.1–1.0)
Monocytes Relative: 9 %
Neutro Abs: 4.4 10*3/uL (ref 1.7–7.7)
Neutrophils Relative %: 69 %
Platelets: 330 10*3/uL (ref 150–400)
RBC: 3.96 MIL/uL (ref 3.87–5.11)
RDW: 12.2 % (ref 11.5–15.5)
WBC: 6.4 10*3/uL (ref 4.0–10.5)
nRBC: 0 % (ref 0.0–0.2)

## 2019-09-12 LAB — COMPREHENSIVE METABOLIC PANEL
ALT: 16 U/L (ref 0–44)
AST: 13 U/L — ABNORMAL LOW (ref 15–41)
Albumin: 3.8 g/dL (ref 3.5–5.0)
Alkaline Phosphatase: 68 U/L (ref 38–126)
Anion gap: 7 (ref 5–15)
BUN: 15 mg/dL (ref 8–23)
CO2: 28 mmol/L (ref 22–32)
Calcium: 8.8 mg/dL — ABNORMAL LOW (ref 8.9–10.3)
Chloride: 105 mmol/L (ref 98–111)
Creatinine, Ser: 0.67 mg/dL (ref 0.44–1.00)
GFR calc Af Amer: >60 mL/min (ref >60)
GFR calc non Af Amer: >60 mL/min (ref >60)
Glucose, Bld: 80 mg/dL (ref 70–99)
Potassium: 3.9 mmol/L (ref 3.5–5.1)
Sodium: 140 mmol/L (ref 135–145)
Total Bilirubin: 0.6 mg/dL (ref 0.3–1.2)
Total Protein: 6.2 g/dL — ABNORMAL LOW (ref 6.5–8.1)

## 2019-09-12 NOTE — Assessment & Plan Note (Signed)
1.  Stage I (T1CN0) invasive lobular carcinoma of the left breast: -Biopsy on 02/28/1999 [facility] consistent with invasive lobular carcinoma 12:30 position, grade 1, ER positive, PR negative and HER-2 negative by FISH. -Lumpectomy and sentinel lymph node biopsy on 04/23/2019 by Dr. Donne Hazel shows grade 1, 1.5 cm invasive lobular carcinoma, margins negative, 1 sentinel lymph node negative, ER more than 90%, PR negative, HER-2 negative, Ki-67 not done.  PT1CPN0. -Anastrozole started around 05/14/2019. -She was seen by radiation oncology in [place].  As the absolute reduction in local recurrence was low at 3% with radiation, patient opted to not to undergo XRT. -She is tolerating anastrozole reasonably well.  Some hot flashes.  No musculoskeletal symptoms. -I reviewed her labs.  They are grossly within normal limits.  She will continue anastrozole for at least 5 years and possibly longer. -She will come back in 4 months for follow-up.  2.  Stage III (T3 N2 M0) non-small cell lung cancer, adenocarcinoma type: -She was treated with chemoradiation therapy with cisplatin VP-16 from 02/18/2008 through 04/25/2008. -Last CT of the chest on 02/25/2019 showed scarring in the right upper medial lung consistent with history of prior treatment.  3 cm left lower lobe lung cyst is unchanged.  No adenopathy.  3.  Osteoporosis: -Started on Prolia in September 2020, receives in [place].  This was prescribed by her rheumatologist in [place]. -She will continue calcium and vitamin D supplements.  4.  Rheumatoid arthritis: -She is currently on Plaquenil and has been off of methotrexate.

## 2019-09-12 NOTE — Progress Notes (Signed)
[place], [place] [address]   CLINIC:  Medical Oncology/Hematology  PCP:  Ollen Bowl, [place] [address] [phone]   REASON FOR VISIT:  Follow-up for left breast cancer.  CURRENT THERAPY: Anastrozole.  BRIEF ONCOLOGIC HISTORY:  Oncology History  Breast cancer, left ([place])  04/01/2019 Initial Diagnosis   Breast cancer, left ([place])   05/14/2019 Cancer Staging   Staging form: Breast, AJCC 8th Edition - Clinical stage from 05/14/2019: Stage IA (cT1c, cN0, cM0, G1, ER+, PR-, HER2-) - Signed by Derek Jack, MD on 05/14/2019      CANCER STAGING: Cancer Staging Breast cancer, left [facility]) Staging form: Breast, AJCC 8th Edition - Clinical stage from 05/14/2019: Stage IA (cT1c, cN0, cM0, G1, ER+, PR-, HER2-) - Signed by Derek Jack, MD on 05/14/2019    INTERVAL HISTORY:  Christy Bennett 64 y.o. female seen for follow-up of left breast cancer.  She is tolerating anastrozole very well.  Reports some constipation.  Appetite is 100%.  Energy levels are 75%.  Reports some numbness in the hands.  No major musculoskeletal symptoms.    REVIEW OF SYSTEMS:  Review of Systems  Gastrointestinal: Positive for constipation.  Neurological: Positive for numbness.  All other systems reviewed and are negative.    PAST MEDICAL/SURGICAL HISTORY:  Past Medical History:  Diagnosis Date  . Allergy   . COPD (chronic obstructive pulmonary disease) ([place])   . Depression   . GERD (gastroesophageal reflux disease)   . Hyperlipemia   . Hypertension   . Hypothyroid   . Lung cancer ([place]) 2009  . Osteoporosis   . PVD (peripheral vascular disease) ([place])   . RA (rheumatoid arthritis) ([employer])    Past Surgical History:  Procedure Laterality Date  . BREAST LUMPECTOMY WITH RADIOACTIVE SEED AND SENTINEL LYMPH NODE BIOPSY Left 04/23/2019   Procedure: LEFT BREAST LUMPECTOMY WITH RADIOACTIVE SEED AND LEFT AXILLARY  SENTINEL LYMPH NODE BIOPSY;  Surgeon: Rolm Bookbinder, MD;  Location: [facility];  Service: General;  Laterality: Left;  . CATARACT EXTRACTION, BILATERAL    . FEMORAL BYPASS Right 2017   x3  . KIDNEY CYST REMOVAL       SOCIAL HISTORY:  Social History   Socioeconomic History  . Marital status: Married    Spouse name: Christy Bennett  . Number of children: Not on file  . Years of education: Not on file  . Highest education level: Not on file  Occupational History  . Occupation: retires  Tobacco Use  . Smoking status: Former Smoker    Packs/day: 1.00    Years: 43.00    Pack years: 43.00    Types: Cigarettes    Quit date: 04/12/2015    Years since quitting: 4.4  . Smokeless tobacco: Never Used  Substance and Sexual Activity  . Alcohol use: Not Currently  . Drug use: Never  . Sexual activity: Not on file  Other Topics Concern  . Not on file  Social History Narrative  . Not on file   Social Determinants of Health   Financial Resource Strain: Low Risk   . Difficulty of Paying Living Expenses: Not hard at all  Food Insecurity: No Food Insecurity  . Worried About Charity fundraiser in the Last Year: Never true  . Ran Out of Food in the Last Year: Never true  Transportation Needs: No Transportation Needs  . Lack of Transportation (Medical): No  . Lack of Transportation (Non-Medical): No  Physical Activity: Sufficiently Active  . Days of Exercise per Week: 7 days  . Minutes of Exercise per Session: 60 min  Stress: No Stress Concern Present  . Feeling of Stress : Only a little  Social Connections: Slightly Isolated  . Frequency of Communication with Friends and Family: Twice a week  . Frequency of Social Gatherings with Friends and Family: Twice a week  . Attends Religious Services: More than 4 times per year  . Active Member of Clubs or Organizations: No  . Attends Archivist Meetings: Never  . Marital Status: Married  Human resources officer Violence:  Not At Risk  . Fear of Current or Ex-Partner: No  . Emotionally Abused: No  . Physically Abused: No  . Sexually Abused: No    FAMILY HISTORY:  Family History  Problem Relation Age of Onset  . Diabetes Mother   . Hyperlipidemia Mother     CURRENT MEDICATIONS:  Outpatient Encounter Medications as of 09/12/2019  Medication Sig  . anastrozole (ARIMIDEX) 1 MG tablet Take 1 tablet (1 mg total) by mouth daily.  Marland Kitchen aspirin EC 81 MG tablet Take 81 mg by mouth daily.  Marland Kitchen buPROPion (ZYBAN) 150 MG 12 hr tablet Take 150 mg by mouth 2 (two) times daily.  . Calcium Citrate-Vitamin D (CAL-CITRATE PLUS VITAMIN D PO) Take 650 mg by mouth daily. Pt two tablets once daily  . cetirizine (ZYRTEC) 10 MG tablet Take 10 mg by mouth daily.  . clopidogrel (PLAVIX) 75 MG tablet Take 75 mg by mouth daily.  Marland Kitchen denosumab (PROLIA) 60 MG/ML SOSY injection Inject 60 mg into the skin every 6 (six) months.  . esomeprazole (HM ESOMEPRAZOLE MAGNESIUM DR) 20 MG capsule Take 20 mg by mouth 2 (two) times daily.  . folic acid (FOLVITE) 1 MG tablet Take 1 mg by mouth 2 (two) times daily.  . hydroxychloroquine (PLAQUENIL) 200 MG tablet Take 250 mg by mouth daily. Pt 1 1/2 tablet daily  . levothyroxine (SYNTHROID) 150 MCG tablet Take 150 mcg by mouth daily before breakfast.  . losartan (COZAAR) 25 MG tablet Take 25 mg by mouth daily.  . simvastatin (ZOCOR) 10 MG tablet Take 10 mg by mouth daily.  Marland Kitchen sulfaSALAzine (AZULFIDINE) 500 MG tablet Take 500 mg by mouth 3 (three) times daily.  . traZODone (DESYREL) 100 MG tablet Take 100 mg by mouth at bedtime.  Marland Kitchen umeclidinium-vilanterol (ANORO ELLIPTA) 62.5-25 MCG/INH AEPB Inhale 1 puff into the lungs daily.  . verapamil (CALAN-SR) 240 MG CR tablet Take 240 mg by mouth daily.  . vitamin C (ASCORBIC ACID) 500 MG tablet Take 500 mg by mouth as needed.  . vitamin E 400 UNIT capsule Take 400 Units by mouth daily. Pt takes 450 mg daily  . albuterol (VENTOLIN HFA) 108 (90 Base) MCG/ACT inhaler  Inhale 2 puffs into the lungs as needed for wheezing or shortness of breath.  . docusate sodium (CVS STOOL SOFTENER) 50 MG capsule Take 50 mg by mouth as needed for mild constipation.   No facility-administered encounter medications on file as of 09/12/2019.    ALLERGIES:  Allergies  Allergen Reactions  . Amoxicillin-Pot Clavulanate Nausea Only and Nausea And Vomiting    Diarrhea, abdominal pain      PHYSICAL EXAM:  ECOG Performance status: 1  Vitals:   09/12/19 1553  BP: 130/63  Pulse: 73  Resp: 19  Temp: (!) 97 F (36.1 C)  SpO2: 98%   Filed Weights   09/12/19 1553  Weight: 128 lb  8 oz (58.3 kg)    Physical Exam Vitals reviewed.  Constitutional:      Appearance: Normal appearance.  Cardiovascular:     Rate and Rhythm: Normal rate and regular rhythm.     Heart sounds: Normal heart sounds.  Pulmonary:     Effort: Pulmonary effort is normal.     Breath sounds: Normal breath sounds.  Abdominal:     General: There is no distension.     Palpations: Abdomen is soft. There is no mass.  Lymphadenopathy:     Cervical: No cervical adenopathy.  Skin:    General: Skin is warm.  Neurological:     General: No focal deficit present.     Mental Status: She is alert and oriented to person, place, and time.  Psychiatric:        Mood and Affect: Mood normal.        Behavior: Behavior normal.    Bilateral breast exam did not reveal any masses.  No palpable adenopathy.  Lumpectomy scar well-healed.  LABORATORY DATA:  I have reviewed the labs as listed.  CBC    Component Value Date/Time   WBC 6.4 09/12/2019 1527   RBC 3.96 09/12/2019 1527   HGB 11.3 (L) 09/12/2019 1527   HCT 36.2 09/12/2019 1527   PLT 330 09/12/2019 1527   MCV 91.4 09/12/2019 1527   MCH 28.5 09/12/2019 1527   MCHC 31.2 09/12/2019 1527   RDW 12.2 09/12/2019 1527   LYMPHSABS 1.2 09/12/2019 1527   MONOABS 0.6 09/12/2019 1527   EOSABS 0.2 09/12/2019 1527   BASOSABS 0.1 09/12/2019 1527   CMP Latest  Ref Rng & Units 09/12/2019  Glucose 70 - 99 mg/dL 80  BUN 8 - 23 mg/dL 15  Creatinine 0.44 - 1.00 mg/dL 0.67  Sodium 135 - 145 mmol/L 140  Potassium 3.5 - 5.1 mmol/L 3.9  Chloride 98 - 111 mmol/L 105  CO2 22 - 32 mmol/L 28  Calcium 8.9 - 10.3 mg/dL 8.8(L)  Total Protein 6.5 - 8.1 g/dL 6.2(L)  Total Bilirubin 0.3 - 1.2 mg/dL 0.6  Alkaline Phos 38 - 126 U/L 68  AST 15 - 41 U/L 13(L)  ALT 0 - 44 U/L 16       DIAGNOSTIC IMAGING:  I have reviewed the scans.    ASSESSMENT & PLAN:   Breast cancer, left ([place]) 1.  Stage I (T1CN0) invasive lobular carcinoma of the left breast: -Biopsy on 02/28/1999 [facility] consistent with invasive lobular carcinoma 12:30 position, grade 1, ER positive, PR negative and HER-2 negative by FISH. -Lumpectomy and sentinel lymph node biopsy on 04/23/2019 by Dr. Donne Hazel shows grade 1, 1.5 cm invasive lobular carcinoma, margins negative, 1 sentinel lymph node negative, ER more than 90%, PR negative, HER-2 negative, Ki-67 not done.  PT1CPN0. -Anastrozole started around 05/14/2019. -She was seen by radiation oncology in [place].  As the absolute reduction in local recurrence was low at 3% with radiation, patient opted to not to undergo XRT. -She is tolerating anastrozole reasonably well.  Some hot flashes.  No musculoskeletal symptoms. -I reviewed her labs.  They are grossly within normal limits.  She will continue anastrozole for at least 5 years and possibly longer. -She will come back in 4 months for follow-up.  2.  Stage III (T3 N2 M0) non-small cell lung cancer, adenocarcinoma type: -She was treated with chemoradiation therapy with cisplatin VP-16 from 02/18/2008 through 04/25/2008. -Last CT of the chest on 02/25/2019 showed scarring in the right upper medial  lung consistent with history of prior treatment.  3 cm left lower lobe lung cyst is unchanged.  No adenopathy.  3.  Osteoporosis: -Started on Prolia in September 2020, receives in  [place].  This was prescribed by her rheumatologist in [place]. -She will continue calcium and vitamin D supplements.  4.  Rheumatoid arthritis: -She is currently on Plaquenil and has been off of methotrexate.      Orders placed this encounter:  Orders Placed This Encounter  Procedures  . CBC with Differential  . Comprehensive metabolic panel  . Vitamin D 25 hydroxy      Derek Jack, MD [place] [phone]

## 2019-09-12 NOTE — Patient Instructions (Signed)
[place] at [facility] Discharge Instructions  You were seen today by Dr. Delton Coombes. He went over your recent lab results. Continue taking the Anastrozole daily. He will see you back in 4 months for labs and follow up.   Thank you for choosing [place] at [facility] to provide your oncology and hematology care.  To afford each patient quality time with our provider, please arrive at least 15 minutes before your scheduled appointment time.   If you have a lab appointment with the [facility] please come in thru the  Main Entrance and check in at the main information desk  You need to re-schedule your appointment should you arrive 10 or more minutes late.  We strive to give you quality time with our providers, and arriving late affects you and other patients whose appointments are after yours.  Also, if you no show three or more times for appointments you may be dismissed from the clinic at the providers discretion.     Again, thank you for choosing [facility].  Our hope is that these requests will decrease the amount of time that you wait before being seen by our physicians.       _____________________________________________________________  Should you have questions after your visit to [facility], please contact our office at [phone] between the hours of 8:00 a.m. and 4:30 p.m.  Voicemails left after 4:00 p.m. will not be returned until the following business day.  For prescription refill requests, have your pharmacy contact our office and allow 72 hours.    Cancer Center Support Programs:   > Cancer Support Group  2nd Tuesday of the month 1pm-2pm, Journey Room

## 2019-09-21 ENCOUNTER — Other Ambulatory Visit (HOSPITAL_COMMUNITY): Payer: Self-pay | Admitting: Hematology

## 2019-09-21 DIAGNOSIS — C50412 Malignant neoplasm of upper-outer quadrant of left female breast: Secondary | ICD-10-CM

## 2019-09-21 DIAGNOSIS — Z170 Estrogen receptor positive status [ER+]: Secondary | ICD-10-CM

## 2019-10-08 DIAGNOSIS — R202 Paresthesia of skin: Secondary | ICD-10-CM | POA: Insufficient documentation

## 2020-01-16 ENCOUNTER — Inpatient Hospital Stay (HOSPITAL_COMMUNITY): Payer: Medicare Other | Attending: Hematology | Admitting: Hematology

## 2020-01-16 ENCOUNTER — Encounter (HOSPITAL_COMMUNITY): Payer: Self-pay | Admitting: Hematology

## 2020-01-16 ENCOUNTER — Inpatient Hospital Stay (HOSPITAL_COMMUNITY): Payer: Medicare Other

## 2020-01-16 ENCOUNTER — Other Ambulatory Visit: Payer: Self-pay

## 2020-01-16 VITALS — BP 165/71 | HR 68 | Temp 97.3°F | Resp 18 | Wt 126.8 lb

## 2020-01-16 DIAGNOSIS — Z85118 Personal history of other malignant neoplasm of bronchus and lung: Secondary | ICD-10-CM | POA: Insufficient documentation

## 2020-01-16 DIAGNOSIS — Z170 Estrogen receptor positive status [ER+]: Secondary | ICD-10-CM | POA: Diagnosis not present

## 2020-01-16 DIAGNOSIS — Z923 Personal history of irradiation: Secondary | ICD-10-CM | POA: Diagnosis not present

## 2020-01-16 DIAGNOSIS — Z9221 Personal history of antineoplastic chemotherapy: Secondary | ICD-10-CM | POA: Insufficient documentation

## 2020-01-16 DIAGNOSIS — Z79811 Long term (current) use of aromatase inhibitors: Secondary | ICD-10-CM | POA: Diagnosis not present

## 2020-01-16 DIAGNOSIS — Z79899 Other long term (current) drug therapy: Secondary | ICD-10-CM | POA: Insufficient documentation

## 2020-01-16 DIAGNOSIS — C50912 Malignant neoplasm of unspecified site of left female breast: Secondary | ICD-10-CM | POA: Diagnosis not present

## 2020-01-16 DIAGNOSIS — R232 Flushing: Secondary | ICD-10-CM | POA: Insufficient documentation

## 2020-01-16 DIAGNOSIS — C50412 Malignant neoplasm of upper-outer quadrant of left female breast: Secondary | ICD-10-CM

## 2020-01-16 DIAGNOSIS — M069 Rheumatoid arthritis, unspecified: Secondary | ICD-10-CM | POA: Insufficient documentation

## 2020-01-16 DIAGNOSIS — J984 Other disorders of lung: Secondary | ICD-10-CM | POA: Insufficient documentation

## 2020-01-16 DIAGNOSIS — M810 Age-related osteoporosis without current pathological fracture: Secondary | ICD-10-CM | POA: Diagnosis not present

## 2020-01-16 LAB — CBC WITH DIFFERENTIAL/PLATELET
Abs Immature Granulocytes: 0.01 10*3/uL (ref 0.00–0.07)
Basophils Absolute: 0.1 10*3/uL (ref 0.0–0.1)
Basophils Relative: 1 %
Eosinophils Absolute: 0.2 10*3/uL (ref 0.0–0.5)
Eosinophils Relative: 3 %
HCT: 37.6 % (ref 36.0–46.0)
Hemoglobin: 11.9 g/dL — ABNORMAL LOW (ref 12.0–15.0)
Immature Granulocytes: 0 %
Lymphocytes Relative: 18 %
Lymphs Abs: 1.4 10*3/uL (ref 0.7–4.0)
MCH: 28.7 pg (ref 26.0–34.0)
MCHC: 31.6 g/dL (ref 30.0–36.0)
MCV: 90.8 fL (ref 80.0–100.0)
Monocytes Absolute: 0.8 10*3/uL (ref 0.1–1.0)
Monocytes Relative: 11 %
Neutro Abs: 5 10*3/uL (ref 1.7–7.7)
Neutrophils Relative %: 67 %
Platelets: 301 10*3/uL (ref 150–400)
RBC: 4.14 MIL/uL (ref 3.87–5.11)
RDW: 12.8 % (ref 11.5–15.5)
WBC: 7.4 10*3/uL (ref 4.0–10.5)
nRBC: 0 % (ref 0.0–0.2)

## 2020-01-16 LAB — COMPREHENSIVE METABOLIC PANEL
ALT: 17 U/L (ref 0–44)
AST: 14 U/L — ABNORMAL LOW (ref 15–41)
Albumin: 3.9 g/dL (ref 3.5–5.0)
Alkaline Phosphatase: 63 U/L (ref 38–126)
Anion gap: 8 (ref 5–15)
BUN: 16 mg/dL (ref 8–23)
CO2: 27 mmol/L (ref 22–32)
Calcium: 9.5 mg/dL (ref 8.9–10.3)
Chloride: 103 mmol/L (ref 98–111)
Creatinine, Ser: 0.63 mg/dL (ref 0.44–1.00)
GFR calc Af Amer: >60 mL/min (ref >60)
GFR calc non Af Amer: >60 mL/min (ref >60)
Glucose, Bld: 108 mg/dL — ABNORMAL HIGH (ref 70–99)
Potassium: 4.3 mmol/L (ref 3.5–5.1)
Sodium: 138 mmol/L (ref 135–145)
Total Bilirubin: 0.4 mg/dL (ref 0.3–1.2)
Total Protein: 6.3 g/dL — ABNORMAL LOW (ref 6.5–8.1)

## 2020-01-16 LAB — VITAMIN D 25 HYDROXY (VIT D DEFICIENCY, FRACTURES): Vit D, 25-Hydroxy: 39.16 ng/mL (ref 30–100)

## 2020-01-16 NOTE — Patient Instructions (Signed)
[facility] at [facility] °Discharge Instructions ° °You were seen today by Dr. Katragadda. He went over your recent results. Dr. Katragadda will see you back in 4 months for labs and follow up. ° ° °Thank you for choosing  [facility] at [facility] to provide your oncology and hematology care.  To afford each patient quality time with our provider, please arrive at least 15 minutes before your scheduled appointment time.  ° °If you have a lab appointment with the Cancer Center please come in thru the Main Entrance and check in at the main information desk ° °You need to re-schedule your appointment should you arrive 10 or more minutes late.  We strive to give you quality time with our providers, and arriving late affects you and other patients whose appointments are after yours.  Also, if you no show three or more times for appointments you may be dismissed from the clinic at the providers discretion.     °Again, thank you for choosing [facility].  Our hope is that these requests will decrease the amount of time that you wait before being seen by our physicians.       °_____________________________________________________________ ° °Should you have questions after your visit to [facility], please contact our office at [phone] between the hours of 8:00 a.m. and 4:30 p.m.  Voicemails left after 4:00 p.m. will not be returned until the following business day.  For prescription refill requests, have your pharmacy contact our office and allow 72 hours.   ° °Cancer Center Support Programs:  ° °> Cancer Support Group  °2nd Tuesday of the month 1pm-2pm, Journey Room  ° ° °

## 2020-01-16 NOTE — Progress Notes (Signed)
[facility] [address] [place] [address]   Patient Care Team: Ollen Bowl, MD as PCP - General (Family Medicine)  SUMMARY OF ONCOLOGIC HISTORY: Oncology History  Breast cancer, left ([place])  04/01/2019 Initial Diagnosis   Breast cancer, left ([place])   05/14/2019 Cancer Staging   Staging form: Breast, AJCC 8th Edition - Clinical stage from 05/14/2019: Stage IA (cT1c, cN0, cM0, G1, ER+, PR-, HER2-) - Signed by Derek Jack, MD on 05/14/2019     CHIEF COMPLIANT: Left breast cancer   INTERVAL HISTORY: [name] is a 64 y.o. female here today for follow up of her left breast cancer. Her last visit was on 09/12/2019.  Today she is accompanied by her husband. She reports that she is tolerating the Arimidex well and denies having any issues. She reports having hot flashes which she has been having for a while now. She is taking calcium and vitamin D. She will start taking methotrexate this week.  She is scheduled to have her next mammogram in September.   REVIEW OF SYSTEMS:   Review of Systems  Constitutional: Negative for appetite change and fatigue.  HENT:   Positive for trouble swallowing (w/ bread & meats).   Cardiovascular: Positive for leg swelling (occasional).  Endocrine: Positive for hot flashes.  Neurological: Positive for numbness (fingertips).  All other systems reviewed and are negative.   I have reviewed the past medical history, past surgical history, social history and family history with the patient and they are unchanged from previous note.   ALLERGIES:   is allergic to amoxicillin-pot clavulanate.   MEDICATIONS:  Current Outpatient Medications  Medication Sig Dispense Refill  . albuterol (VENTOLIN HFA) 108 (90 Base) MCG/ACT inhaler Inhale 2 puffs into the lungs as needed for wheezing or shortness of breath.    . anastrozole (ARIMIDEX) 1 MG tablet Take 1 tablet by mouth once daily 30 tablet 6  . aspirin EC 81 MG  tablet Take 81 mg by mouth daily.    Marland Kitchen buPROPion (WELLBUTRIN SR) 150 MG 12 hr tablet Take 150 mg by mouth 2 (two) times daily.    . Calcium Citrate-Vitamin D (CAL-CITRATE PLUS VITAMIN D PO) Take 650 mg by mouth daily. Pt two tablets once daily    . cetirizine (ZYRTEC) 10 MG tablet Take 10 mg by mouth daily.    . clopidogrel (PLAVIX) 75 MG tablet Take 75 mg by mouth daily.    Marland Kitchen denosumab (PROLIA) 60 MG/ML SOSY injection Inject 60 mg into the skin every 6 (six) months.    . docusate sodium (CVS STOOL SOFTENER) 50 MG capsule Take 50 mg by mouth as needed for mild constipation.    Marland Kitchen esomeprazole (NEXIUM) 40 MG capsule Take 1 tablet by mouth daily.    . folic acid (FOLVITE) 1 MG tablet Take 1 mg by mouth 2 (two) times daily.    . hydroxychloroquine (PLAQUENIL) 200 MG tablet Take 250 mg by mouth daily. Pt 1 1/2 tablet daily    . levothyroxine (SYNTHROID) 150 MCG tablet Take 150 mcg by mouth daily before breakfast.    . losartan (COZAAR) 25 MG tablet Take 25 mg by mouth daily.    . Methotrexate Sodium (METHOTREXATE, PF,) 50 MG/2ML injection SMARTSIG:0.5 Milliliter(s) SUB-Q Once a Week    . simvastatin (ZOCOR) 10 MG tablet Take 10 mg by mouth daily.    Marland Kitchen sulfaSALAzine (AZULFIDINE) 500 MG tablet Take 500 mg by mouth 3 (three) times daily. Taking 564m  tablet in the morning and 2 tablets in the evening    . traZODone (DESYREL) 100 MG tablet Take 100 mg by mouth at bedtime.    . TUBERCULIN SYR 1CC/27GX1/2" 27G X 1/2" 1 ML MISC by Does not apply route.    . umeclidinium-vilanterol (ANORO ELLIPTA) 62.5-25 MCG/INH AEPB Inhale 1 puff into the lungs daily.    . verapamil (CALAN-SR) 240 MG CR tablet Take 240 mg by mouth daily.    . vitamin C (ASCORBIC ACID) 500 MG tablet Take 500 mg by mouth as needed.    . vitamin E 400 UNIT capsule Take 400 Units by mouth daily. Pt takes 450 mg daily     No current facility-administered medications for this visit.     PHYSICAL EXAMINATION: Performance status (ECOG): 1 -  Symptomatic but completely ambulatory  Vitals:   01/16/20 1512  BP: (!) 165/71  Pulse: 68  Resp: 18  Temp: (!) 97.3 F (36.3 C)  SpO2: 93%   Wt Readings from Last 3 Encounters:  01/16/20 126 lb 12.8 oz (57.5 kg)  09/12/19 128 lb 8 oz (58.3 kg)  05/14/19 125 lb 8 oz (56.9 kg)   Physical Exam Vitals reviewed.  Constitutional:      Appearance: Normal appearance.  Cardiovascular:     Rate and Rhythm: Normal rate and regular rhythm.     Pulses: Normal pulses.     Heart sounds: Normal heart sounds.  Pulmonary:     Effort: Pulmonary effort is normal.     Breath sounds: Normal breath sounds.  Chest:     Breasts:        Right: Normal.        Left: Normal.  Neurological:     General: No focal deficit present.     Mental Status: She is alert and oriented to person, place, and time.  Psychiatric:        Mood and Affect: Mood normal.        Behavior: Behavior normal.     Breast Exam Chaperone: Milinda Antis, MD     LABORATORY DATA:  I have reviewed the data as listed CMP Latest Ref Rng & Units 01/16/2020 09/12/2019  Glucose 70 - 99 mg/dL 108(H) 80  BUN 8 - 23 mg/dL 16 15  Creatinine 0.44 - 1.00 mg/dL 0.63 0.67  Sodium 135 - 145 mmol/L 138 140  Potassium 3.5 - 5.1 mmol/L 4.3 3.9  Chloride 98 - 111 mmol/L 103 105  CO2 22 - 32 mmol/L 27 28  Calcium 8.9 - 10.3 mg/dL 9.5 8.8(L)  Total Protein 6.5 - 8.1 g/dL 6.3(L) 6.2(L)  Total Bilirubin 0.3 - 1.2 mg/dL 0.4 0.6  Alkaline Phos 38 - 126 U/L 63 68  AST 15 - 41 U/L 14(L) 13(L)  ALT 0 - 44 U/L 17 16   No results found for: JJK093 Lab Results  Component Value Date   WBC 7.4 01/16/2020   HGB 11.9 (L) 01/16/2020   HCT 37.6 01/16/2020   MCV 90.8 01/16/2020   PLT 301 01/16/2020   NEUTROABS 5.0 01/16/2020  No results found for: VD25OH  ASSESSMENT:  1.  Stage I (T1CN0) invasive lobular carcinoma of the left breast: -Biopsy on 02/28/1999 [facility] consistent with invasive lobular carcinoma 12:30 position, grade  1, ER positive, PR negative and HER-2 negative by FISH. -Lumpectomy and sentinel lymph node biopsy on 04/23/2019 by Dr. Donne Hazel shows grade 1, 1.5 cm invasive lobular carcinoma, margins negative, 1 sentinel lymph node negative, ER more than  90%, PR negative, HER-2 negative, Ki-67 not done.  PT1CPN0. -Anastrozole started around 05/14/2019. -She was seen by radiation oncology in [place].  As the absolute reduction in local recurrence was low at 3% with radiation, patient opted to not to undergo XRT.  2.  Stage III (T3 N2 M0) non-small cell lung cancer, adenocarcinoma type: -She was treated with chemoradiation therapy with cisplatin VP-16 from 02/18/2008 through 04/25/2008. -Last CT of the chest on 02/25/2019 showed scarring in the right upper medial lung consistent with history of prior treatment.  3 cm left lower lobe lung cyst is unchanged.  No adenopathy.  3.  Osteoporosis: -Started on Prolia in September 2020, receives in [place].  This was prescribed by her rheumatologist in [place]. -She will continue calcium and vitamin D supplements.  4.  Rheumatoid arthritis: -She is currently on Plaquenil and has been off of methotrexate.   PLAN:  1.  Stage I (T1CN0) invasive lobular carcinoma of the left breast: -She is tolerating anastrozole very well. -We reviewed her labs which showed normal.  CBC was grossly normal. A physical exam today shows left lumpectomy area is within normal limits and no palpable mass in bilateral breast.  No palpable adenopathy. -We will schedule her for mammogram. -We will see her back in 4 months for follow-up.  2.  Stage III (T3 N2 M0) non-small cell lung cancer, adenocarcinoma type: -She does not have any clinical signs or symptoms of recurrence.  3.  Osteoporosis: -Continue Prolia, calcium and vitamin D supplements.  4.  Rheumatoid arthritis: -She is planning to start back on methotrexate.    Orders Placed This Encounter  Procedures  .  MM Digital Screening    Standing Status:   Future    Standing Expiration Date:   01/15/2021    Order Specific Question:   Reason for Exam (SYMPTOM  OR DIAGNOSIS REQUIRED)    Answer:   hx breast cancer, last mammogram 02/12/2019 at outside facility    Order Specific Question:   Preferred imaging location?    Answer:   [facility]    Order Specific Question:   Release to patient    Answer:   Immediate  . CBC with Differential/Platelet    Standing Status:   Future    Standing Expiration Date:   01/15/2021  . Comprehensive metabolic panel    Standing Status:   Future    Standing Expiration Date:   01/15/2021  . Vitamin D 25 hydroxy    Standing Status:   Future    Standing Expiration Date:   12/15/2020    Order Specific Question:   Release to patient    Answer:   Immediate   The patient has a good understanding of the overall plan. she agrees with it. she will call with any problems that may develop before the next visit here.    Derek Jack, MD [place] [phone]   I, Milinda Antis, am acting as a scribe for Dr. Sanda Linger.  I, Derek Jack MD, have reviewed the above documentation for accuracy and completeness, and I agree with the above.

## 2020-02-13 ENCOUNTER — Other Ambulatory Visit (HOSPITAL_COMMUNITY): Payer: Self-pay

## 2020-02-13 DIAGNOSIS — Z170 Estrogen receptor positive status [ER+]: Secondary | ICD-10-CM

## 2020-02-13 NOTE — Progress Notes (Signed)
Mammogram order placed per Dr. Delton Coombes

## 2020-02-28 ENCOUNTER — Ambulatory Visit
Admission: RE | Admit: 2020-02-28 | Discharge: 2020-02-28 | Disposition: A | Discharge status: Home / Self Care | Payer: Medicare Other | Source: Ambulatory Visit | Attending: Hematology | Admitting: Hematology

## 2020-02-28 ENCOUNTER — Other Ambulatory Visit: Payer: Self-pay

## 2020-02-28 DIAGNOSIS — Z170 Estrogen receptor positive status [ER+]: Secondary | ICD-10-CM

## 2020-04-09 ENCOUNTER — Other Ambulatory Visit (HOSPITAL_COMMUNITY): Payer: Self-pay | Admitting: Hematology

## 2020-04-09 DIAGNOSIS — C50412 Malignant neoplasm of upper-outer quadrant of left female breast: Secondary | ICD-10-CM

## 2020-05-12 ENCOUNTER — Inpatient Hospital Stay (HOSPITAL_COMMUNITY): Payer: Medicare Other | Attending: Hematology and Oncology

## 2020-05-12 ENCOUNTER — Other Ambulatory Visit: Payer: Self-pay

## 2020-05-12 DIAGNOSIS — M810 Age-related osteoporosis without current pathological fracture: Secondary | ICD-10-CM | POA: Insufficient documentation

## 2020-05-12 DIAGNOSIS — Z9221 Personal history of antineoplastic chemotherapy: Secondary | ICD-10-CM | POA: Diagnosis not present

## 2020-05-12 DIAGNOSIS — K5900 Constipation, unspecified: Secondary | ICD-10-CM | POA: Diagnosis not present

## 2020-05-12 DIAGNOSIS — Z85118 Personal history of other malignant neoplasm of bronchus and lung: Secondary | ICD-10-CM | POA: Diagnosis not present

## 2020-05-12 DIAGNOSIS — M069 Rheumatoid arthritis, unspecified: Secondary | ICD-10-CM | POA: Diagnosis not present

## 2020-05-12 DIAGNOSIS — Z923 Personal history of irradiation: Secondary | ICD-10-CM | POA: Diagnosis not present

## 2020-05-12 DIAGNOSIS — Z79899 Other long term (current) drug therapy: Secondary | ICD-10-CM | POA: Insufficient documentation

## 2020-05-12 DIAGNOSIS — R232 Flushing: Secondary | ICD-10-CM | POA: Insufficient documentation

## 2020-05-12 DIAGNOSIS — Z170 Estrogen receptor positive status [ER+]: Secondary | ICD-10-CM

## 2020-05-12 DIAGNOSIS — C50912 Malignant neoplasm of unspecified site of left female breast: Secondary | ICD-10-CM | POA: Insufficient documentation

## 2020-05-12 DIAGNOSIS — Z79811 Long term (current) use of aromatase inhibitors: Secondary | ICD-10-CM | POA: Diagnosis not present

## 2020-05-12 LAB — CBC WITH DIFFERENTIAL/PLATELET
Abs Immature Granulocytes: 0.02 10*3/uL (ref 0.00–0.07)
Basophils Absolute: 0.1 10*3/uL (ref 0.0–0.1)
Basophils Relative: 1 %
Eosinophils Absolute: 0.2 10*3/uL (ref 0.0–0.5)
Eosinophils Relative: 4 %
HCT: 37.1 % (ref 36.0–46.0)
Hemoglobin: 12.1 g/dL (ref 12.0–15.0)
Immature Granulocytes: 0 %
Lymphocytes Relative: 24 %
Lymphs Abs: 1.3 10*3/uL (ref 0.7–4.0)
MCH: 29.4 pg (ref 26.0–34.0)
MCHC: 32.6 g/dL (ref 30.0–36.0)
MCV: 90 fL (ref 80.0–100.0)
Monocytes Absolute: 0.7 10*3/uL (ref 0.1–1.0)
Monocytes Relative: 13 %
Neutro Abs: 3.2 10*3/uL (ref 1.7–7.7)
Neutrophils Relative %: 58 %
Platelets: 281 10*3/uL (ref 150–400)
RBC: 4.12 MIL/uL (ref 3.87–5.11)
RDW: 12.6 % (ref 11.5–15.5)
WBC: 5.5 10*3/uL (ref 4.0–10.5)
nRBC: 0 % (ref 0.0–0.2)

## 2020-05-12 LAB — COMPREHENSIVE METABOLIC PANEL
ALT: 24 U/L (ref 0–44)
AST: 24 U/L (ref 15–41)
Albumin: 4.1 g/dL (ref 3.5–5.0)
Alkaline Phosphatase: 65 U/L (ref 38–126)
Anion gap: 10 (ref 5–15)
BUN: 17 mg/dL (ref 8–23)
CO2: 26 mmol/L (ref 22–32)
Calcium: 9.8 mg/dL (ref 8.9–10.3)
Chloride: 100 mmol/L (ref 98–111)
Creatinine, Ser: 0.61 mg/dL (ref 0.44–1.00)
GFR, Estimated: >60 mL/min (ref >60)
Glucose, Bld: 76 mg/dL (ref 70–99)
Potassium: 3.9 mmol/L (ref 3.5–5.1)
Sodium: 136 mmol/L (ref 135–145)
Total Bilirubin: 0.7 mg/dL (ref 0.3–1.2)
Total Protein: 6.1 g/dL — ABNORMAL LOW (ref 6.5–8.1)

## 2020-05-12 LAB — VITAMIN D 25 HYDROXY (VIT D DEFICIENCY, FRACTURES): Vit D, 25-Hydroxy: 61.68 ng/mL (ref 30–100)

## 2020-05-19 ENCOUNTER — Ambulatory Visit (HOSPITAL_COMMUNITY): Payer: Medicare Other | Admitting: Nurse Practitioner

## 2020-05-20 ENCOUNTER — Ambulatory Visit (HOSPITAL_COMMUNITY): Payer: Medicare Other | Admitting: Oncology

## 2020-05-21 ENCOUNTER — Other Ambulatory Visit: Payer: Self-pay

## 2020-05-21 ENCOUNTER — Inpatient Hospital Stay (HOSPITAL_BASED_OUTPATIENT_CLINIC_OR_DEPARTMENT_OTHER): Payer: Medicare Other | Admitting: Oncology

## 2020-05-21 VITALS — BP 192/65 | HR 76 | Temp 96.4°F | Resp 18 | Wt 124.3 lb

## 2020-05-21 DIAGNOSIS — C50912 Malignant neoplasm of unspecified site of left female breast: Secondary | ICD-10-CM | POA: Diagnosis not present

## 2020-05-21 DIAGNOSIS — C50412 Malignant neoplasm of upper-outer quadrant of left female breast: Secondary | ICD-10-CM

## 2020-05-21 DIAGNOSIS — Z170 Estrogen receptor positive status [ER+]: Secondary | ICD-10-CM

## 2020-05-21 NOTE — Progress Notes (Signed)
[facility] [address] [place] [address]   Patient Care Team: Ollen Bowl, MD as PCP - General (Family Medicine)  SUMMARY OF ONCOLOGIC HISTORY: Oncology History  Breast cancer, left ([place])  04/01/2019 Initial Diagnosis   Breast cancer, left ([place])   05/14/2019 Cancer Staging   Staging form: Breast, AJCC 8th Edition - Clinical stage from 05/14/2019: Stage IA (cT1c, cN0, cM0, G1, ER+, PR-, HER2-) - Signed by Derek Jack, MD on 05/14/2019     CHIEF COMPLIANT: Left breast cancer   INTERVAL HISTORY: [name] is a 64 y.o. female here today for follow up of her left breast cancer. Her last visit was on 01/16/20.  She reports that she is tolerating the Arimidex well and denies having any issues. She reports having hot flashes which she has been having for a while now. She is taking calcium and vitamin D.  She is unable to tolerate methotrexate for her RA and has been switched Leflunomide 20 mg daily for about 1 month.  Has mild constipation and unsure if related to new medication.  Otherwise tolerating this well.   Mammogram from 02/28/2020 was BI-RADS 2-repeat imaging in 1 year.  She is followed in [place] for history of lung cancer.  She is wondering if she can continue surveillance for lung cancer at [facility] along with her breast cancer.  Home   REVIEW OF SYSTEMS:   Review of Systems  Constitutional: Positive for fatigue. Negative for appetite change, fever and unexpected weight change.  HENT:   Negative for nosebleeds, sore throat and trouble swallowing.   Eyes: Negative.   Respiratory: Negative.  Negative for cough, shortness of breath and wheezing.   Cardiovascular: Negative.  Negative for chest pain and leg swelling.  Gastrointestinal: Positive for constipation. Negative for abdominal pain, blood in stool, diarrhea, nausea and vomiting.  Endocrine: Negative.   Genitourinary: Negative.  Negative for bladder incontinence,  hematuria and nocturia.   Musculoskeletal: Negative.  Negative for back pain and flank pain.  Skin: Negative.   Neurological: Negative.  Negative for dizziness, headaches, light-headedness and numbness.  Hematological: Negative.   Psychiatric/Behavioral: Negative.  Negative for confusion. The patient is not nervous/anxious.     I have reviewed the past medical history, past surgical history, social history and family history with the patient and they are unchanged from previous note.   ALLERGIES:   is allergic to amoxicillin-pot clavulanate.   MEDICATIONS:  Current Outpatient Medications  Medication Sig Dispense Refill  . anastrozole (ARIMIDEX) 1 MG tablet Take 1 tablet by mouth once daily 30 tablet 6  . aspirin EC 81 MG tablet Take 81 mg by mouth daily.    Marland Kitchen buPROPion (WELLBUTRIN SR) 150 MG 12 hr tablet Take 150 mg by mouth 2 (two) times daily.    . Calcium Citrate-Vitamin D (CAL-CITRATE PLUS VITAMIN D PO) Take 650 mg by mouth daily. Pt two tablets once daily    . cetirizine (ZYRTEC) 10 MG tablet Take 10 mg by mouth daily.    . clopidogrel (PLAVIX) 75 MG tablet Take 75 mg by mouth daily.    Marland Kitchen denosumab (PROLIA) 60 MG/ML SOSY injection Inject 60 mg into the skin every 6 (six) months.    . docusate sodium (COLACE) 50 MG capsule Take 50 mg by mouth as needed for mild constipation.    Marland Kitchen esomeprazole (NEXIUM) 40 MG capsule Take 1 tablet by mouth daily.    . folic acid (FOLVITE) 1 MG tablet  Take 1 mg by mouth 2 (two) times daily.    . hydroxychloroquine (PLAQUENIL) 200 MG tablet Take 250 mg by mouth daily. Pt 1 1/2 tablet daily    . leflunomide (ARAVA) 20 MG tablet Take 1 tablet by mouth daily.    Marland Kitchen levothyroxine (SYNTHROID) 150 MCG tablet Take 150 mcg by mouth daily before breakfast.    . losartan (COZAAR) 25 MG tablet Take 25 mg by mouth daily.    . simvastatin (ZOCOR) 10 MG tablet Take 10 mg by mouth daily.    Marland Kitchen sulfaSALAzine (AZULFIDINE) 500 MG tablet Take 500 mg by mouth 3 (three)  times daily. Taking 582m tablet in the morning and 2 tablets in the evening    . traZODone (DESYREL) 100 MG tablet Take 100 mg by mouth at bedtime.    . TUBERCULIN SYR 1CC/27GX1/2" 27G X 1/2" 1 ML MISC by Does not apply route.    . umeclidinium-vilanterol (ANORO ELLIPTA) 62.5-25 MCG/INH AEPB Inhale 1 puff into the lungs daily.    . verapamil (CALAN-SR) 240 MG CR tablet Take 240 mg by mouth daily.    . vitamin C (ASCORBIC ACID) 500 MG tablet Take 500 mg by mouth as needed.    . vitamin E 400 UNIT capsule Take 400 Units by mouth daily. Pt takes 450 mg daily    . albuterol (VENTOLIN HFA) 108 (90 Base) MCG/ACT inhaler Inhale 2 puffs into the lungs as needed for wheezing or shortness of breath. (Patient not taking: Reported on 05/21/2020)     No current facility-administered medications for this visit.     PHYSICAL EXAMINATION: Performance status (ECOG): 1 - Symptomatic but completely ambulatory  Vitals:   05/21/20 1326  BP: (!) 192/65  Pulse: 76  Resp: 18  Temp: (!) 96.4 F (35.8 C)  SpO2: 96%   Wt Readings from Last 3 Encounters:  05/21/20 124 lb 5.4 oz (56.4 kg)  01/16/20 126 lb 12.8 oz (57.5 kg)  09/12/19 128 lb 8 oz (58.3 kg)   Physical Exam Vitals reviewed.  Constitutional:      Appearance: Normal appearance.  Cardiovascular:     Rate and Rhythm: Normal rate and regular rhythm.     Pulses: Normal pulses.     Heart sounds: Normal heart sounds.  Pulmonary:     Effort: Pulmonary effort is normal.     Breath sounds: Normal breath sounds.  Chest:  Breasts:     Right: Normal.     Left: Normal.    Neurological:     General: No focal deficit present.     Mental Status: She is alert and oriented to person, place, and time.  Psychiatric:        Mood and Affect: Mood normal.        Behavior: Behavior normal.     Breast Exam Chaperone: DMilinda Antis MD     LABORATORY DATA:  I have reviewed the data as listed CMP Latest Ref Rng & Units 05/12/2020 01/16/2020  09/12/2019  Glucose 70 - 99 mg/dL 76 108(H) 80  BUN 8 - 23 mg/dL 17 16 15   Creatinine 0.44 - 1.00 mg/dL 0.61 0.63 0.67  Sodium 135 - 145 mmol/L 136 138 140  Potassium 3.5 - 5.1 mmol/L 3.9 4.3 3.9  Chloride 98 - 111 mmol/L 100 103 105  CO2 22 - 32 mmol/L 26 27 28   Calcium 8.9 - 10.3 mg/dL 9.8 9.5 8.8(L)  Total Protein 6.5 - 8.1 g/dL 6.1(L) 6.3(L) 6.2(L)  Total Bilirubin 0.3 - 1.2  mg/dL 0.7 0.4 0.6  Alkaline Phos 38 - 126 U/L 65 63 68  AST 15 - 41 U/L 24 14(L) 13(L)  ALT 0 - 44 U/L 24 17 16    No results found for: CAN153 Lab Results  Component Value Date   WBC 5.5 05/12/2020   HGB 12.1 05/12/2020   HCT 37.1 05/12/2020   MCV 90.0 05/12/2020   PLT 281 05/12/2020   NEUTROABS 3.2 05/12/2020   Lab Results  Component Value Date   VD25OH 61.68 05/12/2020   VD25OH 39.16 01/16/2020    ASSESSMENT:  1.  Stage I (T1CN0) invasive lobular carcinoma of the left breast: -Biopsy on 02/28/1999 [facility] consistent with invasive lobular carcinoma 12:30 position, grade 1, ER positive, PR negative and HER-2 negative by FISH. -Lumpectomy and sentinel lymph node biopsy on 04/23/2019 by Dr. Donne Hazel shows grade 1, 1.5 cm invasive lobular carcinoma, margins negative, 1 sentinel lymph node negative, ER more than 90%, PR negative, HER-2 negative, Ki-67 not done.  PT1CPN0. -Anastrozole started around 05/14/2019. -She was seen by radiation oncology in [place].  As the absolute reduction in local recurrence was low at 3% with radiation, patient opted to not to undergo XRT.  2.  Stage III (T3 N2 M0) non-small cell lung cancer, adenocarcinoma type: -She was treated with chemoradiation therapy with cisplatin VP-16 from 02/18/2008 through 04/25/2008. -Last CT of the chest on 02/25/2019 showed scarring in the right upper medial lung consistent with history of prior treatment.  3 cm left lower lobe lung cyst is unchanged.  No adenopathy.  3.  Osteoporosis: -Started on Prolia in September 2020,  receives in [place].  This was prescribed by her rheumatologist in [place]. -She will continue calcium and vitamin D supplements.  4.  Rheumatoid arthritis: -She is currently on Plaquenil and has been off of methotrexate. -Restarted methotrexate and was unable to tolerate secondary to nausea, vomiting and dizziness. -Started on leflunomide in November 2021.  PLAN:  1.  Stage I (T1CN0) invasive lobular carcinoma of the left breast: -She is tolerating anastrozole very well. -We reviewed her labs which showed normal.  CBC was grossly normal. -Mammogram from 02/28/2020 was BI-RADS Category 2-benign.  Repeat in 1 year.  2.  Stage III (T3 N2 M0) non-small cell lung cancer, adenocarcinoma type: -She does not have any clinical signs or symptoms of recurrence.  3.  Osteoporosis: -Continue Prolia, calcium and vitamin D supplements.  4.  Rheumatoid arthritis: -Unable to tolerate methotrexate secondary to nausea, vomiting and dizziness. -Started leflunomide about 1 month ago.  Appears to be tolerating well.  5.  Constipation: -Unclear etiology. -Reviewed side effects of leflunomide with patient and constipation does not appear to be one.  -Recommend increased oral hydration and a stool softener such as senna.   Disposition: -RTC in 4 months for repeat labs and MD assessment.   No orders of the defined types were placed in this encounter.  Greater than 50% was spent in counseling and coordination of care with this patient including but not limited to discussion of the relevant topics above (See A&P) including, but not limited to diagnosis and management of acute and chronic medical conditions.   Faythe Casa, NP 05/21/2020 2:43 PM  [place] [phone]

## 2020-09-09 ENCOUNTER — Inpatient Hospital Stay (HOSPITAL_COMMUNITY): Payer: Medicare Other

## 2020-09-10 ENCOUNTER — Other Ambulatory Visit (HOSPITAL_COMMUNITY): Payer: Self-pay | Admitting: *Deleted

## 2020-09-10 DIAGNOSIS — C50412 Malignant neoplasm of upper-outer quadrant of left female breast: Secondary | ICD-10-CM

## 2020-09-10 DIAGNOSIS — Z170 Estrogen receptor positive status [ER+]: Secondary | ICD-10-CM

## 2020-09-10 DIAGNOSIS — R978 Other abnormal tumor markers: Secondary | ICD-10-CM

## 2020-09-11 ENCOUNTER — Other Ambulatory Visit (HOSPITAL_COMMUNITY): Payer: Medicare Other

## 2020-09-16 ENCOUNTER — Other Ambulatory Visit: Payer: Self-pay

## 2020-09-16 ENCOUNTER — Ambulatory Visit (HOSPITAL_COMMUNITY): Payer: Medicare Other | Admitting: Hematology

## 2020-09-16 ENCOUNTER — Inpatient Hospital Stay (HOSPITAL_COMMUNITY): Payer: Medicare Other | Attending: Hematology

## 2020-09-16 ENCOUNTER — Other Ambulatory Visit (HOSPITAL_COMMUNITY): Payer: Self-pay

## 2020-09-16 DIAGNOSIS — R971 Elevated cancer antigen 125 [CA 125]: Secondary | ICD-10-CM | POA: Insufficient documentation

## 2020-09-16 DIAGNOSIS — K5900 Constipation, unspecified: Secondary | ICD-10-CM | POA: Diagnosis not present

## 2020-09-16 DIAGNOSIS — R232 Flushing: Secondary | ICD-10-CM | POA: Diagnosis not present

## 2020-09-16 DIAGNOSIS — C50412 Malignant neoplasm of upper-outer quadrant of left female breast: Secondary | ICD-10-CM

## 2020-09-16 DIAGNOSIS — Z85118 Personal history of other malignant neoplasm of bronchus and lung: Secondary | ICD-10-CM | POA: Diagnosis not present

## 2020-09-16 DIAGNOSIS — Z79811 Long term (current) use of aromatase inhibitors: Secondary | ICD-10-CM | POA: Diagnosis not present

## 2020-09-16 DIAGNOSIS — Z9221 Personal history of antineoplastic chemotherapy: Secondary | ICD-10-CM | POA: Diagnosis not present

## 2020-09-16 DIAGNOSIS — C50912 Malignant neoplasm of unspecified site of left female breast: Secondary | ICD-10-CM | POA: Insufficient documentation

## 2020-09-16 DIAGNOSIS — M810 Age-related osteoporosis without current pathological fracture: Secondary | ICD-10-CM | POA: Diagnosis not present

## 2020-09-16 DIAGNOSIS — Z170 Estrogen receptor positive status [ER+]: Secondary | ICD-10-CM | POA: Diagnosis not present

## 2020-09-16 DIAGNOSIS — Z79899 Other long term (current) drug therapy: Secondary | ICD-10-CM | POA: Diagnosis not present

## 2020-09-16 DIAGNOSIS — M069 Rheumatoid arthritis, unspecified: Secondary | ICD-10-CM | POA: Diagnosis not present

## 2020-09-16 DIAGNOSIS — Z923 Personal history of irradiation: Secondary | ICD-10-CM | POA: Diagnosis not present

## 2020-09-16 DIAGNOSIS — R978 Other abnormal tumor markers: Secondary | ICD-10-CM

## 2020-09-16 LAB — COMPREHENSIVE METABOLIC PANEL
ALT: 83 U/L — ABNORMAL HIGH (ref 0–44)
AST: 27 U/L (ref 15–41)
Albumin: 3.7 g/dL (ref 3.5–5.0)
Alkaline Phosphatase: 78 U/L (ref 38–126)
Anion gap: 10 (ref 5–15)
BUN: 15 mg/dL (ref 8–23)
CO2: 29 mmol/L (ref 22–32)
Calcium: 9.3 mg/dL (ref 8.9–10.3)
Chloride: 99 mmol/L (ref 98–111)
Creatinine, Ser: 0.73 mg/dL (ref 0.44–1.00)
GFR, Estimated: >60 mL/min (ref >60)
Glucose, Bld: 102 mg/dL — ABNORMAL HIGH (ref 70–99)
Potassium: 3.4 mmol/L — ABNORMAL LOW (ref 3.5–5.1)
Sodium: 138 mmol/L (ref 135–145)
Total Bilirubin: 0.5 mg/dL (ref 0.3–1.2)
Total Protein: 4.8 g/dL — ABNORMAL LOW (ref 6.5–8.1)

## 2020-09-16 LAB — CBC WITH DIFFERENTIAL/PLATELET
Abs Immature Granulocytes: 0.05 10*3/uL (ref 0.00–0.07)
Basophils Absolute: 0 10*3/uL (ref 0.0–0.1)
Basophils Relative: 0 %
Eosinophils Absolute: 0.2 10*3/uL (ref 0.0–0.5)
Eosinophils Relative: 2 %
HCT: 35.5 % — ABNORMAL LOW (ref 36.0–46.0)
Hemoglobin: 11 g/dL — ABNORMAL LOW (ref 12.0–15.0)
Immature Granulocytes: 1 %
Lymphocytes Relative: 19 %
Lymphs Abs: 1.6 10*3/uL (ref 0.7–4.0)
MCH: 30.6 pg (ref 26.0–34.0)
MCHC: 31 g/dL (ref 30.0–36.0)
MCV: 98.6 fL (ref 80.0–100.0)
Monocytes Absolute: 1.3 10*3/uL — ABNORMAL HIGH (ref 0.1–1.0)
Monocytes Relative: 15 %
Neutro Abs: 5.2 10*3/uL (ref 1.7–7.7)
Neutrophils Relative %: 63 %
Platelets: 324 10*3/uL (ref 150–400)
RBC: 3.6 MIL/uL — ABNORMAL LOW (ref 3.87–5.11)
RDW: 14.3 % (ref 11.5–15.5)
WBC: 8.3 10*3/uL (ref 4.0–10.5)
nRBC: 0 % (ref 0.0–0.2)

## 2020-09-16 MED ORDER — ANASTROZOLE 1 MG PO TABS: 1.0000 mg | ORAL_TABLET | Freq: Every day | ORAL | 6 refills | Status: DC

## 2020-09-17 LAB — CA 125: Cancer Antigen (CA) 125: 39 U/mL — ABNORMAL HIGH (ref 0.0–38.1)

## 2020-09-23 ENCOUNTER — Other Ambulatory Visit: Payer: Self-pay

## 2020-09-23 ENCOUNTER — Inpatient Hospital Stay (HOSPITAL_COMMUNITY): Payer: Medicare Other | Attending: Hematology | Admitting: Hematology

## 2020-09-23 ENCOUNTER — Ambulatory Visit (HOSPITAL_COMMUNITY): Payer: Medicare Other | Admitting: Hematology

## 2020-09-23 VITALS — BP 138/55 | HR 86 | Temp 97.0°F | Resp 18 | Wt 126.2 lb

## 2020-09-23 DIAGNOSIS — C50912 Malignant neoplasm of unspecified site of left female breast: Secondary | ICD-10-CM | POA: Insufficient documentation

## 2020-09-23 DIAGNOSIS — C50412 Malignant neoplasm of upper-outer quadrant of left female breast: Secondary | ICD-10-CM

## 2020-09-23 DIAGNOSIS — R232 Flushing: Secondary | ICD-10-CM | POA: Diagnosis not present

## 2020-09-23 DIAGNOSIS — R5383 Other fatigue: Secondary | ICD-10-CM | POA: Diagnosis not present

## 2020-09-23 DIAGNOSIS — Z1231 Encounter for screening mammogram for malignant neoplasm of breast: Secondary | ICD-10-CM | POA: Diagnosis not present

## 2020-09-23 DIAGNOSIS — Z79811 Long term (current) use of aromatase inhibitors: Secondary | ICD-10-CM | POA: Insufficient documentation

## 2020-09-23 DIAGNOSIS — M810 Age-related osteoporosis without current pathological fracture: Secondary | ICD-10-CM | POA: Insufficient documentation

## 2020-09-23 DIAGNOSIS — M069 Rheumatoid arthritis, unspecified: Secondary | ICD-10-CM | POA: Diagnosis not present

## 2020-09-23 DIAGNOSIS — Z170 Estrogen receptor positive status [ER+]: Secondary | ICD-10-CM | POA: Insufficient documentation

## 2020-09-23 DIAGNOSIS — Z923 Personal history of irradiation: Secondary | ICD-10-CM | POA: Diagnosis not present

## 2020-09-23 DIAGNOSIS — Z85118 Personal history of other malignant neoplasm of bronchus and lung: Secondary | ICD-10-CM | POA: Diagnosis not present

## 2020-09-23 DIAGNOSIS — Z9221 Personal history of antineoplastic chemotherapy: Secondary | ICD-10-CM | POA: Diagnosis not present

## 2020-09-23 DIAGNOSIS — Z79899 Other long term (current) drug therapy: Secondary | ICD-10-CM | POA: Diagnosis not present

## 2020-09-23 NOTE — Patient Instructions (Signed)
[place] at [facility] Discharge Instructions  You were seen today by Dr. Delton Coombes. He went over your recent results and scans. You will be scheduled to have a mammogram done after October 8th, 2022. Dr. Delton Coombes will see you back after the mammogram for labs and follow up.   Thank you for choosing [place] at [facility] to provide your oncology and hematology care.  To afford each patient quality time with our provider, please arrive at least 15 minutes before your scheduled appointment time.   If you have a lab appointment with the [facility] please come in thru the Main Entrance and check in at the main information desk  You need to re-schedule your appointment should you arrive 10 or more minutes late.  We strive to give you quality time with our providers, and arriving late affects you and other patients whose appointments are after yours.  Also, if you no show three or more times for appointments you may be dismissed from the clinic at the providers discretion.     Again, thank you for choosing [facility].  Our hope is that these requests will decrease the amount of time that you wait before being seen by our physicians.       _____________________________________________________________  Should you have questions after your visit to [facility], please contact our office at [phone] between the hours of 8:00 a.m. and 4:30 p.m.  Voicemails left after 4:00 p.m. will not be returned until the following business day.  For prescription refill requests, have your pharmacy contact our office and allow 72 hours.    Cancer Center Support Programs:   > Cancer Support Group  2nd Tuesday of the month 1pm-2pm, Journey Room

## 2020-09-23 NOTE — Progress Notes (Signed)
[facility] [address] [place] [address]   Patient Care Team: Ollen Bowl, MD as PCP - General (Family Medicine)  SUMMARY OF ONCOLOGIC HISTORY: Oncology History  Breast cancer, left ([place])  04/01/2019 Initial Diagnosis   Breast cancer, left ([place])   05/14/2019 Cancer Staging   Staging form: Breast, AJCC 8th Edition - Clinical stage from 05/14/2019: Stage IA (cT1c, cN0, cM0, G1, ER+, PR-, HER2-) - Signed by Derek Jack, MD on 05/14/2019     CHIEF COMPLIANT: Follow-up for left breast cancer   INTERVAL HISTORY: [name] is a 65 y.o. female here today for follow up of her left breast cancer. Her last visit was with Faythe Casa, NP, on 05/21/2020.   Today she is accompanied by her husband and she reports feeling okay. She reports that she woke up with vomiting and SOB on 04/19 and was diagnosed with septic pneumonia; it was negative for COVID or influenza. She is currently on a prednisone taper and reports having trouble sleeping. She reports that she was not part of a clinical trial though she has been getting CT scans every year. She is taking Arimidex and her hot flashes have mellowed out and she is getting Prolia injections.   REVIEW OF SYSTEMS:   Review of Systems  Constitutional: Positive for fatigue (75%). Negative for appetite change.  HENT:   Positive for trouble swallowing (w/ bread & meat).   Endocrine: Positive for hot flashes (improved).  Psychiatric/Behavioral: Positive for sleep disturbance (on prednisone taper).  All other systems reviewed and are negative.   I have reviewed the past medical history, past surgical history, social history and family history with the patient and they are unchanged from previous note.   ALLERGIES:   is allergic to amoxicillin-pot clavulanate.   MEDICATIONS:  Current Outpatient Medications  Medication Sig Dispense Refill  . albuterol (VENTOLIN HFA) 108 (90 Base) MCG/ACT inhaler  Inhale 2 puffs into the lungs as needed for wheezing or shortness of breath. (Patient not taking: Reported on 05/21/2020)    . anastrozole (ARIMIDEX) 1 MG tablet Take 1 tablet (1 mg total) by mouth daily. 30 tablet 6  . aspirin EC 81 MG tablet Take 81 mg by mouth daily.    Marland Kitchen buPROPion (WELLBUTRIN SR) 150 MG 12 hr tablet Take 150 mg by mouth 2 (two) times daily.    . Calcium Citrate-Vitamin D (CAL-CITRATE PLUS VITAMIN D PO) Take 650 mg by mouth daily. Pt two tablets once daily    . cetirizine (ZYRTEC) 10 MG tablet Take 10 mg by mouth daily.    . clopidogrel (PLAVIX) 75 MG tablet Take 75 mg by mouth daily.    Marland Kitchen denosumab (PROLIA) 60 MG/ML SOSY injection Inject 60 mg into the skin every 6 (six) months.    . docusate sodium (COLACE) 50 MG capsule Take 50 mg by mouth as needed for mild constipation.    Marland Kitchen esomeprazole (NEXIUM) 40 MG capsule Take 1 tablet by mouth daily.    . folic acid (FOLVITE) 1 MG tablet Take 1 mg by mouth 2 (two) times daily.    . hydroxychloroquine (PLAQUENIL) 200 MG tablet Take 250 mg by mouth daily. Pt 1 1/2 tablet daily    . leflunomide (ARAVA) 20 MG tablet Take 1 tablet by mouth daily.    Marland Kitchen levothyroxine (SYNTHROID) 150 MCG tablet Take 150 mcg by mouth daily before breakfast.    . losartan (COZAAR) 25 MG tablet Take 25 mg by mouth  daily.    . simvastatin (ZOCOR) 10 MG tablet Take 10 mg by mouth daily.    Marland Kitchen sulfaSALAzine (AZULFIDINE) 500 MG tablet Take 500 mg by mouth 3 (three) times daily. Taking 538m tablet in the morning and 2 tablets in the evening    . traZODone (DESYREL) 100 MG tablet Take 100 mg by mouth at bedtime.    . TUBERCULIN SYR 1CC/27GX1/2" 27G X 1/2" 1 ML MISC by Does not apply route.    . umeclidinium-vilanterol (ANORO ELLIPTA) 62.5-25 MCG/INH AEPB Inhale 1 puff into the lungs daily.    . verapamil (CALAN-SR) 240 MG CR tablet Take 240 mg by mouth daily.    . vitamin C (ASCORBIC ACID) 500 MG tablet Take 500 mg by mouth as needed.    . vitamin E 400 UNIT  capsule Take 400 Units by mouth daily. Pt takes 450 mg daily     No current facility-administered medications for this visit.     PHYSICAL EXAMINATION: Performance status (ECOG): 1 - Symptomatic but completely ambulatory  Vitals:   09/23/20 1515  BP: (!) 138/55  Pulse: 86  Resp: 18  Temp: (!) 97 F (36.1 C)  SpO2: 95%   Wt Readings from Last 3 Encounters:  09/23/20 126 lb 3.2 oz (57.2 kg)  05/21/20 124 lb 5.4 oz (56.4 kg)  01/16/20 126 lb 12.8 oz (57.5 kg)   Physical Exam Vitals reviewed.  Constitutional:      Appearance: Normal appearance.  Cardiovascular:     Rate and Rhythm: Normal rate and regular rhythm.     Pulses: Normal pulses.     Heart sounds: Normal heart sounds.  Pulmonary:     Effort: Pulmonary effort is normal.     Breath sounds: Normal breath sounds.  Chest:  Breasts:     Right: Normal. No swelling, bleeding, inverted nipple, mass, nipple discharge, skin change or tenderness.     Left: Normal. No swelling, bleeding, inverted nipple, mass, nipple discharge, skin change (lumpectomy scar well-healed) or tenderness.    Neurological:     General: No focal deficit present.     Mental Status: She is alert and oriented to person, place, and time.  Psychiatric:        Mood and Affect: Mood normal.        Behavior: Behavior normal.     Breast Exam Chaperone: DMilinda Antis MD     LABORATORY DATA:  I have reviewed the data as listed CMP Latest Ref Rng & Units 09/16/2020 05/12/2020 01/16/2020  Glucose 70 - 99 mg/dL 102(H) 76 108(H)  BUN 8 - 23 mg/dL _0 Creatinine 0.44 - 1.00 mg/dL 0.73 0.61 0.63  Sodium 135 - 145 mmol/L 138 136 138  Potassium 3.5 - 5.1 mmol/L 3.4(L) 3.9 4.3  Chloride 98 - 111 mmol/L 99 100 103  CO2 22 - 32 mmol/L _1 Calcium 8.9 - 10.3 mg/dL 9.3 9.8 9.5  Total Protein 6.5 - 8.1 g/dL 4.8(L) 6.1(L) 6.3(L)  Total Bilirubin 0.3 - 1.2 mg/dL 0.5 0.7 0.4  Alkaline Phos 38 - 126 U/L 78 65 63  AST 15 - 41 U/L 27 24 14(L)  ALT  0 - 44 U/L 83(H) 24 17   No results found for: CEVO350Lab Results  Component Value Date   WBC 8.3 09/16/2020   HGB 11.0 (L) 09/16/2020   HCT 35.5 (L) 09/16/2020   MCV 98.6 09/16/2020   PLT 324 09/16/2020   NEUTROABS 5.2 09/16/2020  ASSESSMENT:  1. Stage I (T1CN0) invasive lobular carcinoma of the left breast: -Biopsy on 02/28/2019 [facility] consistent with invasive lobular carcinoma 12:30 position, grade 1, ER positive, PR negative and HER-2 negative by FISH. -Lumpectomy and sentinel lymph node biopsy on 04/23/2019 by Dr. Donne Hazel shows grade 1, 1.5 cm invasive lobular carcinoma, margins negative, 1 sentinel lymph node negative, ER more than 90%, PR negative, HER-2 negative, Ki-67 not done. PT1CPN0. -Anastrozole started around 05/14/2019. -She was seen by radiation oncology in [place]. As the absolute reduction in local recurrence was low at 3% with radiation, patient opted to not to undergo XRT.  2. Stage III (T3 N2 M0) non-small cell lung cancer, adenocarcinoma type: -She was treated with chemoradiation therapy with cisplatin VP-16 from 02/18/2008 through 04/25/2008. -Last CT of the chest on 02/25/2019 showed scarring in the right upper medial lung consistent with history of prior treatment. 3 cm left lower lobe lung cyst is unchanged. No adenopathy.  3. Osteoporosis: -Started on Prolia in September 2020, receives in [place]. This was prescribed by her rheumatologist in [place]. -She will continue calcium and vitamin D supplements.  4. Rheumatoid arthritis: -She is currently on Plaquenil and has been off of methotrexate.   PLAN:  1. Stage I (T1CN0) invasive lobular carcinoma of the left breast: -Physical exam today shows left lumpectomy area was within normal limits. - She is tolerating anastrozole very well. - Reviewed labs from 09/16/2020 which showed slightly elevated ALT compared to previous labs.  Other LFTs are normal. - Elevated ALT  possibly due to recent hospitalization and antibiotic use.  We will closely monitor. - We will schedule her for mammogram after 02/27/2021. - We will see her back in 5 months after the mammogram.  We will do labs on the same day.  2. Stage III (T3 N2 M0) non-small cell lung cancer, adenocarcinoma type: -I have reviewed CT scan from 09/08/2020 done at [facility] which did not show any evidence of recurrence.  Bilateral patchy infiltrates suggestive of infectious process for which she was treated. - We will find out whether she was part of RTOG 0972 protocol and still requires imaging.  3. Osteoporosis: -Continue Prolia, calcium and vitamin D supplements.  4. Rheumatoid arthritis: -Continue Arava 20 mg daily.   Breast Cancer therapy associated bone loss: I have recommended calcium, Vitamin D and weight bearing exercises.  Orders placed this encounter:  No orders of the defined types were placed in this encounter.   The patient has a good understanding of the overall plan. She agrees with it. She will call with any problems that may develop before the next visit here.  Derek Jack, MD [place] [phone]   I, Milinda Antis, am acting as a scribe for Dr. Sanda Linger.  I, Derek Jack MD, have reviewed the above documentation for accuracy and completeness, and I agree with the above.

## 2020-11-16 ENCOUNTER — Other Ambulatory Visit (HOSPITAL_COMMUNITY): Payer: Self-pay | Admitting: Hematology

## 2020-11-16 DIAGNOSIS — C50412 Malignant neoplasm of upper-outer quadrant of left female breast: Secondary | ICD-10-CM

## 2021-01-26 ENCOUNTER — Other Ambulatory Visit (HOSPITAL_COMMUNITY): Payer: Self-pay | Admitting: Physician Assistant

## 2021-01-26 DIAGNOSIS — Z170 Estrogen receptor positive status [ER+]: Secondary | ICD-10-CM

## 2021-02-22 ENCOUNTER — Other Ambulatory Visit (HOSPITAL_COMMUNITY): Payer: Self-pay | Admitting: Hematology

## 2021-02-22 DIAGNOSIS — R928 Other abnormal and inconclusive findings on diagnostic imaging of breast: Secondary | ICD-10-CM

## 2021-02-22 DIAGNOSIS — Z9889 Other specified postprocedural states: Secondary | ICD-10-CM

## 2021-03-04 ENCOUNTER — Ambulatory Visit (HOSPITAL_COMMUNITY): Payer: Medicare Other

## 2021-03-04 ENCOUNTER — Inpatient Hospital Stay (HOSPITAL_COMMUNITY): Payer: Medicare Other

## 2021-03-05 ENCOUNTER — Ambulatory Visit (HOSPITAL_COMMUNITY)
Admission: RE | Admit: 2021-03-05 | Discharge: 2021-03-05 | Disposition: A | Discharge status: Home / Self Care | Payer: Medicare Other | Source: Ambulatory Visit | Attending: Hematology | Admitting: Hematology

## 2021-03-05 ENCOUNTER — Inpatient Hospital Stay (HOSPITAL_COMMUNITY): Payer: Medicare Other | Attending: Hematology

## 2021-03-05 ENCOUNTER — Ambulatory Visit (HOSPITAL_COMMUNITY): Payer: Medicare Other

## 2021-03-05 ENCOUNTER — Other Ambulatory Visit: Payer: Self-pay

## 2021-03-05 DIAGNOSIS — Z170 Estrogen receptor positive status [ER+]: Secondary | ICD-10-CM | POA: Diagnosis not present

## 2021-03-05 DIAGNOSIS — R928 Other abnormal and inconclusive findings on diagnostic imaging of breast: Secondary | ICD-10-CM | POA: Diagnosis present

## 2021-03-05 DIAGNOSIS — Z923 Personal history of irradiation: Secondary | ICD-10-CM | POA: Insufficient documentation

## 2021-03-05 DIAGNOSIS — Z9889 Other specified postprocedural states: Secondary | ICD-10-CM | POA: Diagnosis present

## 2021-03-05 DIAGNOSIS — Z79811 Long term (current) use of aromatase inhibitors: Secondary | ICD-10-CM | POA: Insufficient documentation

## 2021-03-05 DIAGNOSIS — Z85118 Personal history of other malignant neoplasm of bronchus and lung: Secondary | ICD-10-CM | POA: Diagnosis not present

## 2021-03-05 DIAGNOSIS — M069 Rheumatoid arthritis, unspecified: Secondary | ICD-10-CM | POA: Diagnosis not present

## 2021-03-05 DIAGNOSIS — Z1231 Encounter for screening mammogram for malignant neoplasm of breast: Secondary | ICD-10-CM

## 2021-03-05 DIAGNOSIS — C50912 Malignant neoplasm of unspecified site of left female breast: Secondary | ICD-10-CM | POA: Insufficient documentation

## 2021-03-05 DIAGNOSIS — C50412 Malignant neoplasm of upper-outer quadrant of left female breast: Secondary | ICD-10-CM

## 2021-03-05 LAB — CBC WITH DIFFERENTIAL/PLATELET
Abs Immature Granulocytes: 0.01 10*3/uL (ref 0.00–0.07)
Basophils Absolute: 0.1 10*3/uL (ref 0.0–0.1)
Basophils Relative: 1 %
Eosinophils Absolute: 0.2 10*3/uL (ref 0.0–0.5)
Eosinophils Relative: 5 %
HCT: 34.6 % — ABNORMAL LOW (ref 36.0–46.0)
Hemoglobin: 11.1 g/dL — ABNORMAL LOW (ref 12.0–15.0)
Immature Granulocytes: 0 %
Lymphocytes Relative: 18 %
Lymphs Abs: 0.8 10*3/uL (ref 0.7–4.0)
MCH: 31.4 pg (ref 26.0–34.0)
MCHC: 32.1 g/dL (ref 30.0–36.0)
MCV: 98 fL (ref 80.0–100.0)
Monocytes Absolute: 0.6 10*3/uL (ref 0.1–1.0)
Monocytes Relative: 13 %
Neutro Abs: 3 10*3/uL (ref 1.7–7.7)
Neutrophils Relative %: 63 %
Platelets: 267 10*3/uL (ref 150–400)
RBC: 3.53 MIL/uL — ABNORMAL LOW (ref 3.87–5.11)
RDW: 14.1 % (ref 11.5–15.5)
WBC: 4.7 10*3/uL (ref 4.0–10.5)
nRBC: 0 % (ref 0.0–0.2)

## 2021-03-05 LAB — COMPREHENSIVE METABOLIC PANEL
ALT: 36 U/L (ref 0–44)
AST: 28 U/L (ref 15–41)
Albumin: 4 g/dL (ref 3.5–5.0)
Alkaline Phosphatase: 82 U/L (ref 38–126)
Anion gap: 6 (ref 5–15)
BUN: 16 mg/dL (ref 8–23)
CO2: 28 mmol/L (ref 22–32)
Calcium: 8.7 mg/dL — ABNORMAL LOW (ref 8.9–10.3)
Chloride: 106 mmol/L (ref 98–111)
Creatinine, Ser: 0.75 mg/dL (ref 0.44–1.00)
GFR, Estimated: >60 mL/min (ref >60)
Glucose, Bld: 107 mg/dL — ABNORMAL HIGH (ref 70–99)
Potassium: 3.8 mmol/L (ref 3.5–5.1)
Sodium: 140 mmol/L (ref 135–145)
Total Bilirubin: 0.5 mg/dL (ref 0.3–1.2)
Total Protein: 5.2 g/dL — ABNORMAL LOW (ref 6.5–8.1)

## 2021-03-06 LAB — CANCER ANTIGEN 15-3: CA 15-3: 20 U/mL (ref 0.0–25.0)

## 2021-03-10 NOTE — Progress Notes (Signed)
[name], [name]   Patient Care Team: Ollen Bowl, MD as PCP - General (Family Medicine)  SUMMARY OF ONCOLOGIC HISTORY: Oncology History  Breast cancer, left ([place])  04/01/2019 Initial Diagnosis   Breast cancer, left ([place])   05/14/2019 Cancer Staging   Staging form: Breast, AJCC 8th Edition - Clinical stage from 05/14/2019: Stage IA (cT1c, cN0, cM0, G1, ER+, PR-, HER2-) - Signed by Derek Jack, MD on 05/14/2019     CHIEF COMPLIANT: Follow-up for left breast cancer   INTERVAL HISTORY: [name] is a 65 y.o. female here today for follow up of her left breast cancer. Her last visit was on 09/23/2020.   Today she reports feeling good. She denies any new or worsened cough. She is taking anastrozole and tolerating it well. She reports hot flashes in the evenings; she also reports joint pain, but she attributes this to her history of RA.   REVIEW OF SYSTEMS:   Review of Systems  Constitutional:  Negative for appetite change and fatigue (75%).  Respiratory:  Negative for cough.   Endocrine: Positive for hot flashes.  Musculoskeletal:  Positive for arthralgias (RA).  All other systems reviewed and are negative.  I have reviewed the past medical history, past surgical history, social history and family history with the patient and they are unchanged from previous note.   ALLERGIES:   is allergic to amoxicillin-pot clavulanate.   MEDICATIONS:  Current Outpatient Medications  Medication Sig Dispense Refill   albuterol (VENTOLIN HFA) 108 (90 Base) MCG/ACT inhaler Inhale 2 puffs into the lungs as needed for wheezing or shortness of breath.     anastrozole (ARIMIDEX) 1 MG tablet Take 1 tablet by mouth once daily 60 tablet 0   aspirin EC 81 MG tablet Take 81 mg by mouth daily.     b complex vitamins capsule Take 1 capsule by mouth daily.     buPROPion (WELLBUTRIN SR) 150 MG 12 hr tablet Take 150 mg by mouth 2  (two) times daily.     Calcium Citrate-Vitamin D (CAL-CITRATE PLUS VITAMIN D PO) Take 650 mg by mouth daily. Pt two tablets once daily     cetirizine (ZYRTEC) 10 MG tablet Take 10 mg by mouth daily.     clopidogrel (PLAVIX) 75 MG tablet Take 75 mg by mouth daily.     denosumab (PROLIA) 60 MG/ML SOSY injection Inject 60 mg into the skin every 6 (six) months.     diclofenac Sodium (VOLTAREN) 1 % GEL Apply topically.     docusate sodium (COLACE) 50 MG capsule Take 50 mg by mouth as needed for mild constipation.     esomeprazole (NEXIUM) 40 MG capsule Take 1 tablet by mouth daily.     hydroxychloroquine (PLAQUENIL) 200 MG tablet Take 250 mg by mouth daily. Pt 1 1/2 tablet daily     leflunomide (ARAVA) 20 MG tablet Take 1 tablet by mouth daily.     levofloxacin (LEVAQUIN) 750 MG tablet Take by mouth.     levothyroxine (SYNTHROID) 150 MCG tablet Take 150 mcg by mouth daily before breakfast.     losartan (COZAAR) 25 MG tablet Take 25 mg by mouth daily.     predniSONE (DELTASONE) 20 MG tablet Take by mouth.     simvastatin (ZOCOR) 10 MG tablet Take 10 mg by mouth daily.     sulfaSALAzine (AZULFIDINE) 500 MG tablet Take 500 mg by mouth 3 (three) times daily. Taking 575m  tablet in the morning and 2 tablets in the evening     traZODone (DESYREL) 100 MG tablet Take 100 mg by mouth at bedtime.     TUBERCULIN SYR 1CC/27GX1/2" 27G X 1/2" 1 ML MISC by Does not apply route.     umeclidinium-vilanterol (ANORO ELLIPTA) 62.5-25 MCG/INH AEPB Inhale 1 puff into the lungs daily.     verapamil (CALAN-SR) 240 MG CR tablet Take 240 mg by mouth daily.     vitamin C (ASCORBIC ACID) 500 MG tablet Take 500 mg by mouth as needed.     vitamin E 400 UNIT capsule Take 400 Units by mouth daily. Pt takes 450 mg daily     No current facility-administered medications for this visit.     PHYSICAL EXAMINATION: Performance status (ECOG): 1 - Symptomatic but completely ambulatory  There were no vitals filed for this visit. Wt  Readings from Last 3 Encounters:  09/23/20 126 lb 3.2 oz (57.2 kg)  05/21/20 124 lb 5.4 oz (56.4 kg)  01/16/20 126 lb 12.8 oz (57.5 kg)   Physical Exam Vitals reviewed.  Constitutional:      Appearance: Normal appearance.  Cardiovascular:     Rate and Rhythm: Normal rate and regular rhythm.     Pulses: Normal pulses.     Heart sounds: Normal heart sounds.  Pulmonary:     Effort: Pulmonary effort is normal.     Breath sounds: Normal breath sounds.  Chest:  Breasts:    Right: Normal. No inverted nipple, mass, nipple discharge, skin change or tenderness.     Left: Normal. No inverted nipple, mass, nipple discharge, skin change (lumpectomy scar around areola within normal limits) or tenderness.  Abdominal:     Palpations: Abdomen is soft. There is no hepatomegaly, splenomegaly or mass.     Tenderness: There is no abdominal tenderness.  Musculoskeletal:     Right lower leg: No edema.     Left lower leg: No edema.  Lymphadenopathy:     Upper Body:     Right upper body: No supraclavicular, axillary or pectoral adenopathy.     Left upper body: No supraclavicular, axillary or pectoral adenopathy.  Neurological:     General: No focal deficit present.     Mental Status: She is alert and oriented to person, place, and time.  Psychiatric:        Mood and Affect: Mood normal.        Behavior: Behavior normal.    Breast Exam Chaperone: Thana Ates     LABORATORY DATA:  I have reviewed the data as listed CMP Latest Ref Rng & Units 03/05/2021 09/16/2020 05/12/2020  Glucose 70 - 99 mg/dL 107(H) 102(H) 76  BUN 8 - 23 mg/dL _0 Creatinine 0.44 - 1.00 mg/dL 0.75 0.73 0.61  Sodium 135 - 145 mmol/L 140 138 136  Potassium 3.5 - 5.1 mmol/L 3.8 3.4(L) 3.9  Chloride 98 - 111 mmol/L 106 99 100  CO2 22 - 32 mmol/L _1 Calcium 8.9 - 10.3 mg/dL 8.7(L) 9.3 9.8  Total Protein 6.5 - 8.1 g/dL 5.2(L) 4.8(L) 6.1(L)  Total Bilirubin 0.3 - 1.2 mg/dL 0.5 0.5 0.7  Alkaline Phos 38 - 126 U/L  82 78 65  AST 15 - 41 U/L _2 ALT 0 - 44 U/L 36 83(H) 24   Lab Results  Component Value Date   CAN153 20.0 03/05/2021   Lab Results  Component Value Date   WBC 4.7 03/05/2021  HGB 11.1 (L) 03/05/2021   HCT 34.6 (L) 03/05/2021   MCV 98.0 03/05/2021   PLT 267 03/05/2021   NEUTROABS 3.0 03/05/2021    ASSESSMENT:  1.  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 -Lumpectomy and sentinel lymph node biopsy on 04/23/2019 by Dr. Donne Hazel shows grade 1, 1.5 cm invasive lobular carcinoma, margins negative, 1 sentinel lymph node negative, ER more than 90%, PR negative, HER-2 negative, Ki-67 not done.  PT1CPN0. -Anastrozole started around 05/14/2019. -She was seen by radiation oncology in [place].  As the absolute reduction in local recurrence was low at 3% with radiation, patient opted to not to undergo XRT.   2.  Stage III (T3 N2 M0) non-small cell lung cancer, adenocarcinoma type: -She was treated with chemoradiation therapy with cisplatin VP-16 from 02/18/2008 through 04/25/2008. -Last CT of the chest on 02/25/2019 showed scarring in the right upper medial lung consistent with history of prior treatment.  3 cm left lower lobe lung cyst is unchanged.  No adenopathy.   3.  Osteoporosis: -Started on Prolia in September 2020, receives in [place].  This was prescribed by her rheumatologist in [place]. -She will continue calcium and vitamin D supplements.   4.  Rheumatoid arthritis: -She is currently on Plaquenil and has been off of methotrexate.   PLAN:  1.  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  No palpable masses in bilateral breast.  No palpable lymphadenopathy. - She  is tolerating anastrozole very well.  She has some hot flashes, predominantly at nighttime which are tolerable. - Reviewed mammogram from 03/05/2021 which was BI-RADS Category 2. - Reviewed labs from 03/05/2021 which showed normal LFTs and CBC.  CA 15-3 was 20. - Continue anastrozole.  Recommend follow-up in 6 months with repeat exam.  2.  Stage III (T3 N2 M0) non-small cell lung cancer, adenocarcinoma type: -CT scan from 09/08/2020 [facility] at [facility] did not show any evidence of recurrence.  Bilateral patchy infiltrates suggestive of infectious process for which she was treated.   3.  Osteoporosis: -She is receiving Prolia locally in [place]. - Continue calcium and vitamin D supplements.  We will check vitamin D level at next visit.   4.  Rheumatoid arthritis: -Continue Arava, Plaquenil, and sulfasalazine.  Breast Cancer therapy associated bone loss: I have recommended calcium, Vitamin D and weight bearing exercises.  Orders placed this encounter:  No orders of the defined types were placed in this encounter.   The patient has a good understanding of the overall plan. She agrees with it. She will call with any problems that may develop before the next visit here.  Derek Jack, MD [facility] [phone]   I, Thana Ates, am acting as a scribe for Dr. Derek Jack.  I, Derek Jack MD, have reviewed the above documentation for accuracy and completeness, and I agree with the above.

## 2021-03-11 ENCOUNTER — Other Ambulatory Visit: Payer: Self-pay

## 2021-03-11 ENCOUNTER — Inpatient Hospital Stay (HOSPITAL_BASED_OUTPATIENT_CLINIC_OR_DEPARTMENT_OTHER): Payer: Medicare Other | Admitting: Hematology

## 2021-03-11 VITALS — BP 116/54 | HR 83 | Temp 98.3°F | Resp 18 | Wt 125.4 lb

## 2021-03-11 DIAGNOSIS — E559 Vitamin D deficiency, unspecified: Secondary | ICD-10-CM | POA: Diagnosis not present

## 2021-03-11 DIAGNOSIS — Z170 Estrogen receptor positive status [ER+]: Secondary | ICD-10-CM | POA: Diagnosis not present

## 2021-03-11 DIAGNOSIS — C50912 Malignant neoplasm of unspecified site of left female breast: Secondary | ICD-10-CM | POA: Diagnosis not present

## 2021-03-11 DIAGNOSIS — C50412 Malignant neoplasm of upper-outer quadrant of left female breast: Secondary | ICD-10-CM

## 2021-03-11 NOTE — Patient Instructions (Signed)
[facility] at [facility] Discharge Instructions  You were seen and examined today by Dr. Delton Coombes. He reviewed your most recent labs and everything looks okay. It showed some slight anemia but nothing to worry about right now. Please keep follow up as scheduled.   Thank you for choosing [place] at [facility] to provide your oncology and hematology care.  To afford each patient quality time with our provider, please arrive at least 15 minutes before your scheduled appointment time.   If you have a lab appointment with the [place] please come in thru the Main Entrance and check in at the main information desk.  You need to re-schedule your appointment should you arrive 10 or more minutes late.  We strive to give you quality time with our providers, and arriving late affects you and other patients whose appointments are after yours.  Also, if you no show three or more times for appointments you may be dismissed from the clinic at the providers discretion.     Again, thank you for choosing [facility].  Our hope is that these requests will decrease the amount of time that you wait before being seen by our physicians.       _____________________________________________________________  Should you have questions after your visit to [facility], please contact our office at [phone] and follow the prompts.  Our office hours are 8:00 a.m. and 4:30 p.m. Monday - Friday.  Please note that voicemails left after 4:00 p.m. may not be returned until the following business day.  We are closed weekends and major holidays.  You do have access to a nurse 24-7, just call the main number to the clinic [phone] and do not press any options, hold on the line and a nurse will answer the phone.    For prescription refill requests, have your pharmacy contact our office and allow 72 hours.    Due to Covid, you will need to wear a  mask upon entering the hospital. If you do not have a mask, a mask will be given to you at the Main Entrance upon arrival. For doctor visits, patients may have 1 support person age 5 or older with them. For treatment visits, patients can not have anyone with them due to social distancing guidelines and our immunocompromised population.

## 2021-03-23 ENCOUNTER — Other Ambulatory Visit (HOSPITAL_COMMUNITY): Payer: Self-pay | Admitting: *Deleted

## 2021-03-23 ENCOUNTER — Encounter (HOSPITAL_COMMUNITY): Payer: Self-pay | Admitting: *Deleted

## 2021-03-23 DIAGNOSIS — Z170 Estrogen receptor positive status [ER+]: Secondary | ICD-10-CM

## 2021-03-23 DIAGNOSIS — C50412 Malignant neoplasm of upper-outer quadrant of left female breast: Secondary | ICD-10-CM

## 2021-03-23 MED ORDER — ANASTROZOLE 1 MG PO TABS: 1.0000 mg | ORAL_TABLET | Freq: Every day | ORAL | 0 refills | Status: DC

## 2021-04-29 DIAGNOSIS — L84 Corns and callosities: Secondary | ICD-10-CM | POA: Insufficient documentation

## 2021-06-23 ENCOUNTER — Other Ambulatory Visit (HOSPITAL_COMMUNITY): Payer: Self-pay | Admitting: Hematology

## 2021-06-23 DIAGNOSIS — C50412 Malignant neoplasm of upper-outer quadrant of left female breast: Secondary | ICD-10-CM

## 2021-09-09 ENCOUNTER — Inpatient Hospital Stay (HOSPITAL_COMMUNITY): Payer: Medicare Other | Attending: Hematology

## 2021-09-09 DIAGNOSIS — Z85118 Personal history of other malignant neoplasm of bronchus and lung: Secondary | ICD-10-CM | POA: Insufficient documentation

## 2021-09-09 DIAGNOSIS — C50912 Malignant neoplasm of unspecified site of left female breast: Secondary | ICD-10-CM | POA: Diagnosis present

## 2021-09-09 DIAGNOSIS — M810 Age-related osteoporosis without current pathological fracture: Secondary | ICD-10-CM | POA: Diagnosis not present

## 2021-09-09 DIAGNOSIS — Z170 Estrogen receptor positive status [ER+]: Secondary | ICD-10-CM | POA: Diagnosis not present

## 2021-09-09 DIAGNOSIS — Z923 Personal history of irradiation: Secondary | ICD-10-CM | POA: Diagnosis not present

## 2021-09-09 DIAGNOSIS — E559 Vitamin D deficiency, unspecified: Secondary | ICD-10-CM

## 2021-09-09 DIAGNOSIS — M069 Rheumatoid arthritis, unspecified: Secondary | ICD-10-CM | POA: Insufficient documentation

## 2021-09-09 DIAGNOSIS — Z79811 Long term (current) use of aromatase inhibitors: Secondary | ICD-10-CM | POA: Diagnosis not present

## 2021-09-09 DIAGNOSIS — R232 Flushing: Secondary | ICD-10-CM | POA: Diagnosis not present

## 2021-09-09 LAB — CBC WITH DIFFERENTIAL/PLATELET
Abs Immature Granulocytes: 0.01 10*3/uL (ref 0.00–0.07)
Basophils Absolute: 0.1 10*3/uL (ref 0.0–0.1)
Basophils Relative: 1 %
Eosinophils Absolute: 0.3 10*3/uL (ref 0.0–0.5)
Eosinophils Relative: 5 %
HCT: 35 % — ABNORMAL LOW (ref 36.0–46.0)
Hemoglobin: 11.2 g/dL — ABNORMAL LOW (ref 12.0–15.0)
Immature Granulocytes: 0 %
Lymphocytes Relative: 15 %
Lymphs Abs: 0.8 10*3/uL (ref 0.7–4.0)
MCH: 30.4 pg (ref 26.0–34.0)
MCHC: 32 g/dL (ref 30.0–36.0)
MCV: 94.9 fL (ref 80.0–100.0)
Monocytes Absolute: 0.8 10*3/uL (ref 0.1–1.0)
Monocytes Relative: 14 %
Neutro Abs: 3.6 10*3/uL (ref 1.7–7.7)
Neutrophils Relative %: 65 %
Platelets: 241 10*3/uL (ref 150–400)
RBC: 3.69 MIL/uL — ABNORMAL LOW (ref 3.87–5.11)
RDW: 13 % (ref 11.5–15.5)
WBC: 5.6 10*3/uL (ref 4.0–10.5)
nRBC: 0 % (ref 0.0–0.2)

## 2021-09-09 LAB — COMPREHENSIVE METABOLIC PANEL
ALT: 30 U/L (ref 0–44)
AST: 26 U/L (ref 15–41)
Albumin: 3.9 g/dL (ref 3.5–5.0)
Alkaline Phosphatase: 76 U/L (ref 38–126)
Anion gap: 5 (ref 5–15)
BUN: 16 mg/dL (ref 8–23)
CO2: 29 mmol/L (ref 22–32)
Calcium: 9.4 mg/dL (ref 8.9–10.3)
Chloride: 106 mmol/L (ref 98–111)
Creatinine, Ser: 0.62 mg/dL (ref 0.44–1.00)
GFR, Estimated: >60 mL/min (ref >60)
Glucose, Bld: 87 mg/dL (ref 70–99)
Potassium: 4 mmol/L (ref 3.5–5.1)
Sodium: 140 mmol/L (ref 135–145)
Total Bilirubin: 0.7 mg/dL (ref 0.3–1.2)
Total Protein: 6 g/dL — ABNORMAL LOW (ref 6.5–8.1)

## 2021-09-09 LAB — VITAMIN D 25 HYDROXY (VIT D DEFICIENCY, FRACTURES): Vit D, 25-Hydroxy: 72.45 ng/mL (ref 30–100)

## 2021-09-10 LAB — CANCER ANTIGEN 15-3: CA 15-3: 19.9 U/mL (ref 0.0–25.0)

## 2021-09-16 ENCOUNTER — Inpatient Hospital Stay (HOSPITAL_BASED_OUTPATIENT_CLINIC_OR_DEPARTMENT_OTHER): Payer: Medicare Other | Admitting: Hematology

## 2021-09-16 ENCOUNTER — Other Ambulatory Visit (HOSPITAL_COMMUNITY): Payer: Self-pay | Admitting: Hematology

## 2021-09-16 VITALS — BP 158/70 | HR 77 | Temp 98.0°F | Resp 18 | Wt 121.4 lb

## 2021-09-16 DIAGNOSIS — Z170 Estrogen receptor positive status [ER+]: Secondary | ICD-10-CM

## 2021-09-16 DIAGNOSIS — E559 Vitamin D deficiency, unspecified: Secondary | ICD-10-CM | POA: Diagnosis not present

## 2021-09-16 DIAGNOSIS — C50412 Malignant neoplasm of upper-outer quadrant of left female breast: Secondary | ICD-10-CM

## 2021-09-16 DIAGNOSIS — C50912 Malignant neoplasm of unspecified site of left female breast: Secondary | ICD-10-CM | POA: Diagnosis not present

## 2021-09-16 NOTE — Patient Instructions (Addendum)
[place] at [facility] ?Discharge Instructions ? ?You were seen and examined today by Dr. Delton Coombes. ? ?Dr. Delton Coombes discussed your most recent lab work which is stable. ? ?Continue Anastrozole as prescribed. ? ?Follow-up as scheduled. ? ? ?Thank you for choosing [place] at [facility] to provide your oncology and hematology care.  To afford each patient quality time with our provider, please arrive at least 15 minutes before your scheduled appointment time.  ? ?If you have a lab appointment with the [facility] please come in thru the Main Entrance and check in at the main information desk. ? ?You need to re-schedule your appointment should you arrive 10 or more minutes late.  We strive to give you quality time with our providers, and arriving late affects you and other patients whose appointments are after yours.  Also, if you no show three or more times for appointments you may be dismissed from the clinic at the providers discretion.     ?Again, thank you for choosing [facility].  Our hope is that these requests will decrease the amount of time that you wait before being seen by our physicians.       ?_____________________________________________________________ ? ?Should you have questions after your visit to [facility], please contact our office at [phone] and follow the prompts.  Our office hours are 8:00 a.m. and 4:30 p.m. Monday - Friday.  Please note that voicemails left after 4:00 p.m. may not be returned until the following business day.  We are closed weekends and major holidays.  You do have access to a nurse 24-7, just call the main number to the clinic [phone] and do not press any options, hold on the line and a nurse will answer the phone.   ? ?For prescription refill requests, have your pharmacy contact our office and allow 72 hours.   ? ?Due to Covid, you will need to wear a mask upon entering the  hospital. If you do not have a mask, a mask will be given to you at the Main Entrance upon arrival. For doctor visits, patients may have 1 support person age 74 or older with them. For treatment visits, patients can not have anyone with them due to social distancing guidelines and our immunocompromised population.  ? ? ? ?

## 2021-09-16 NOTE — Progress Notes (Signed)
? ?[facility] ?[address] ?[place], [place] [address] ? ? ?Patient Care Team: ?Ollen Bowl, MD as PCP - General (Family Medicine) ?Derek Jack, MD as Medical Oncologist (Medical Oncology) ? ?SUMMARY OF ONCOLOGIC HISTORY: ?Oncology History  ?Breast cancer, left ([place])  ?04/01/2019 Initial Diagnosis  ? Breast cancer, left ([place]) ? ?  ?05/14/2019 Cancer Staging  ? Staging form: Breast, AJCC 8th Edition ?- Clinical stage from 05/14/2019: Stage IA (cT1c, cN0, cM0, G1, ER+, PR-, HER2-) - Signed by Derek Jack, MD on 05/14/2019 ? ?  ? ? ?CHIEF COMPLIANT: Follow-up for left breast cancer ? ? ?INTERVAL HISTORY: [name] is a 66 y.o. female here today for follow up of her left breast cancer. Her last visit was on 03/11/2021.  ? ?Today she reports feeling good. She has continued taking anastrozole. She reports hot flashes at night which occasionally disrupt her sleep. She reports occasional discomfort in her left breast when laying on her left side.  ? ?REVIEW OF SYSTEMS:   ?Review of Systems  ?Constitutional:  Negative for appetite change and fatigue.  ?Respiratory:  Positive for cough and shortness of breath.   ?Psychiatric/Behavioral:  Positive for depression. The patient is nervous/anxious.   ?All other systems reviewed and are negative. ? ?I have reviewed the past medical history, past surgical history, social history and family history with the patient and they are unchanged from previous note. ? ? ?ALLERGIES:   ?is allergic to amoxicillin-pot clavulanate. ? ? ?MEDICATIONS:  ?Current Outpatient Medications  ?Medication Sig Dispense Refill  ? anastrozole (ARIMIDEX) 1 MG tablet Take 1 tablet by mouth once daily 90 tablet 3  ? arformoterol (BROVANA) 15 MCG/2ML NEBU Inhale into the lungs.    ? aspirin EC 81 MG tablet Take 81 mg by mouth daily.    ? b complex vitamins capsule Take 1 capsule by mouth daily.    ? buPROPion (WELLBUTRIN SR) 150 MG 12 hr tablet Take 150 mg by mouth 2  (two) times daily.    ? Calcium Citrate-Vitamin D (CAL-CITRATE PLUS VITAMIN D PO) Take 650 mg by mouth daily. Pt two tablets once daily    ? cetirizine (ZYRTEC) 10 MG tablet Take 10 mg by mouth daily.    ? clopidogrel (PLAVIX) 75 MG tablet Take 75 mg by mouth daily.    ? denosumab (PROLIA) 60 MG/ML SOSY injection Inject 60 mg into the skin every 6 (six) months.    ? diclofenac Sodium (VOLTAREN) 1 % GEL Apply topically.    ? docusate sodium (COLACE) 50 MG capsule Take 50 mg by mouth as needed for mild constipation.    ? esomeprazole (NEXIUM) 40 MG capsule Take 1 tablet by mouth daily.    ? hydroxychloroquine (PLAQUENIL) 200 MG tablet Take 250 mg by mouth daily. Pt 1 1/2 tablet daily    ? leflunomide (ARAVA) 20 MG tablet Take 1 tablet by mouth daily.    ? losartan (COZAAR) 25 MG tablet Take 25 mg by mouth daily.    ? revefenacin (YUPELRI) 175 MCG/3ML nebulizer solution Inhale into the lungs.    ? simvastatin (ZOCOR) 10 MG tablet Take 10 mg by mouth daily.    ? sulfaSALAzine (AZULFIDINE) 500 MG tablet Take 500 mg by mouth 3 (three) times daily. Taking 540m tablet in the morning and 2 tablets in the evening    ? traZODone (DESYREL) 100 MG tablet Take 100 mg by mouth at bedtime.    ? verapamil (CALAN-SR) 240 MG CR  tablet Take 240 mg by mouth daily.    ? vitamin C (ASCORBIC ACID) 500 MG tablet Take 500 mg by mouth as needed.    ? vitamin E 400 UNIT capsule Take 400 Units by mouth daily. Pt takes 450 mg daily    ? levothyroxine (SYNTHROID) 175 MCG tablet Take 175 mcg by mouth daily.    ? ?No current facility-administered medications for this visit.  ? ? ? ?PHYSICAL EXAMINATION: ?Performance status (ECOG): 1 - Symptomatic but completely ambulatory ? ?Vitals:  ? 09/16/21 1541  ?BP: (!) 158/70  ?Pulse: 77  ?Resp: 18  ?Temp: 98 ?F (36.7 ?C)  ?SpO2: 95%  ? ?Wt Readings from Last 3 Encounters:  ?09/16/21 121 lb 6.4 oz (55.1 kg)  ?03/11/21 125 lb 6.4 oz (56.9 kg)  ?09/23/20 126 lb 3.2 oz (57.2 kg)  ? ?Physical Exam ?Vitals  reviewed.  ?Constitutional:   ?   Appearance: Normal appearance.  ?Cardiovascular:  ?   Rate and Rhythm: Normal rate and regular rhythm.  ?   Pulses: Normal pulses.  ?   Heart sounds: Normal heart sounds.  ?Pulmonary:  ?   Effort: Pulmonary effort is normal.  ?   Breath sounds: Normal breath sounds.  ?Chest:  ?Breasts: ?   Right: No swelling, bleeding, inverted nipple, mass, nipple discharge, skin change or tenderness.  ?   Left: No swelling, bleeding, inverted nipple, mass, nipple discharge, skin change (UOQ around areola lumpectomy scar well healed) or tenderness.  ?Lymphadenopathy:  ?   Upper Body:  ?   Right upper body: No supraclavicular or axillary adenopathy.  ?   Left upper body: No supraclavicular or axillary adenopathy.  ?Neurological:  ?   General: No focal deficit present.  ?   Mental Status: She is alert and oriented to person, place, and time.  ?Psychiatric:     ?   Mood and Affect: Mood normal.     ?   Behavior: Behavior normal.  ? ? ?Breast Exam Chaperone: Thana Ates   ? ? ?LABORATORY DATA:  ?I have reviewed the data as listed ? ?  Latest Ref Rng & Units 09/09/2021  ?  1:38 PM 03/05/2021  ? 11:37 AM 09/16/2020  ? 10:41 AM  ?CMP  ?Glucose 70 - 99 mg/dL 87   107   102    ?BUN 8 - 23 mg/dL 16   16   15     ?Creatinine 0.44 - 1.00 mg/dL 0.62   0.75   0.73    ?Sodium 135 - 145 mmol/L 140   140   138    ?Potassium 3.5 - 5.1 mmol/L 4.0   3.8   3.4    ?Chloride 98 - 111 mmol/L 106   106   99    ?CO2 22 - 32 mmol/L 29   28   29     ?Calcium 8.9 - 10.3 mg/dL 9.4   8.7   9.3    ?Total Protein 6.5 - 8.1 g/dL 6.0   5.2   4.8    ?Total Bilirubin 0.3 - 1.2 mg/dL 0.7   0.5   0.5    ?Alkaline Phos 38 - 126 U/L 76   82   78    ?AST 15 - 41 U/L 26   28   27     ?ALT 0 - 44 U/L 30   36   83    ? ?Lab Results  ?Component Value Date  ? DJT701 19.9 09/09/2021  ? XBL390 20.0  03/05/2021  ? ?Lab Results  ?Component Value Date  ? WBC 5.6 09/09/2021  ? HGB 11.2 (L) 09/09/2021  ? HCT 35.0 (L) 09/09/2021  ? MCV 94.9 09/09/2021  ?  PLT 241 09/09/2021  ? NEUTROABS 3.6 09/09/2021  ? ? ?ASSESSMENT:  ?1.  Stage I (T1CN0) invasive lobular carcinoma of the left breast: ?-Biopsy on 02/28/2019 [facility] consistent with invasive lobular carcinoma 12:30 position, grade 1, ER positive, PR negative and HER-2 negative by FISH. ?-Lumpectomy and sentinel lymph node biopsy on 04/23/2019 by Dr. Donne Hazel shows grade 1, 1.5 cm invasive lobular carcinoma, margins negative, 1 sentinel lymph node negative, ER more than 90%, PR negative, HER-2 negative, Ki-67 not done.  PT1CPN0. ?-Anastrozole started around 05/14/2019. ?-She was seen by radiation oncology in [place].  As the absolute reduction in local recurrence was low at 3% with radiation, patient opted to not to undergo XRT. ?  ?2.  Stage III (T3 N2 M0) non-small cell lung cancer, adenocarcinoma type: ?-She was treated with chemoradiation therapy with cisplatin VP-16 from 02/18/2008 through 04/25/2008. ?-Last CT of the chest on 02/25/2019 showed scarring in the right upper medial lung consistent with history of prior treatment.  3 cm left lower lobe lung cyst is unchanged.  No adenopathy. ?  ?3.  Osteoporosis: ?-Started on Prolia in September 2020, receives in [place].  This was prescribed by her rheumatologist in [place]. ?-She will continue calcium and vitamin D supplements. ?  ?4.  Rheumatoid arthritis: ?-She is currently on Plaquenil and has been off of methotrexate. ? ? ?PLAN:  ?1.  Stage I (T1CN0) invasive lobular carcinoma of the left breast: ?- She is tolerating anastrozole very well.  She has some hot flashes predominantly at nighttime. ?- Last mammogram on 03/05/2021 was BI-RADS Category 2. ?- Physical examination today shows left lumpectomy scar in the upper outer quadrant around the areola is within normal limits with no palpable masses or adenopathy. ?- Reviewed labs from 09/09/2021 which showed normal LFTs and CBC.  CA 15-3 was 19.9. ?- She will have mammogram in October.  RTC 6  months for follow-up. ?  ?2.  Stage III (T3 N2 M0) non-small cell lung cancer, adenocarcinoma type: ?- CT scan from 09/08/2020 done at [facility] did not show any evidence of recurrence.  Bilateral

## 2022-01-01 IMAGING — MG DIGITAL DIAGNOSTIC BILAT W/ TOMO W/ CAD
6 of 11 series · 6 of 31 positions shown · non-contrast
Comparison: Previous exam(s).

CLINICAL DATA: 64-year-old female for annual follow-up. History of
LEFT breast cancer and lumpectomy in 5757.

EXAM:
DIGITAL DIAGNOSTIC BILATERAL MAMMOGRAM WITH CAD AND TOMO

[L MLO]
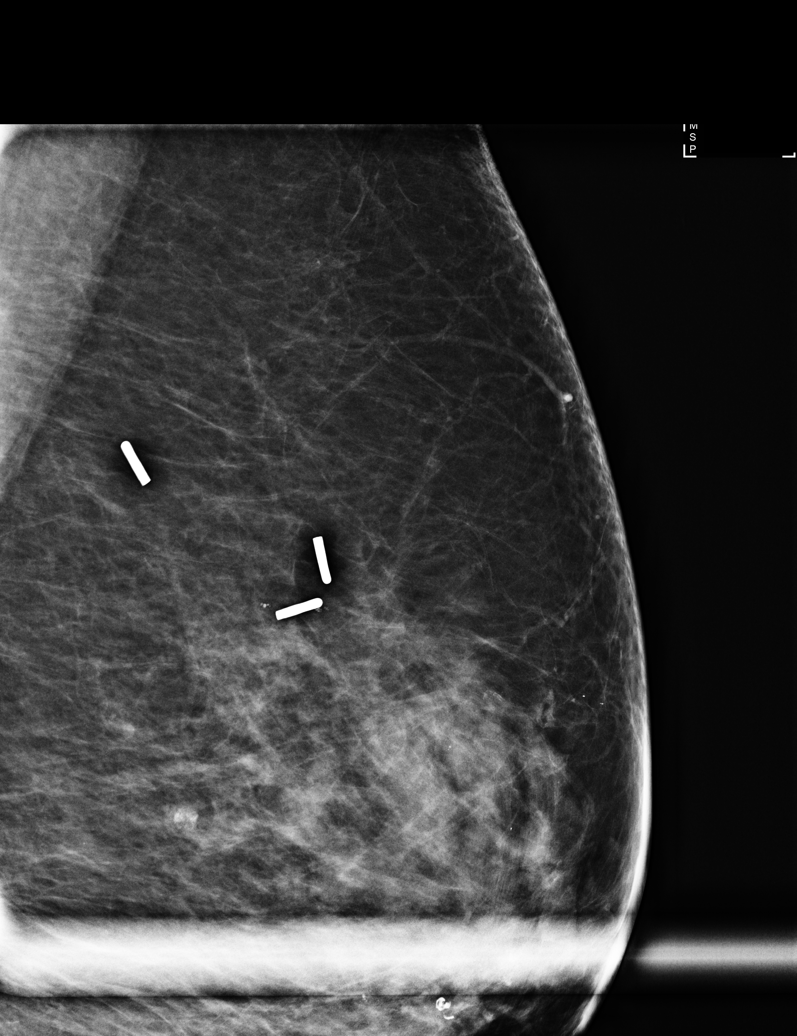

[L MLO synth-2D]
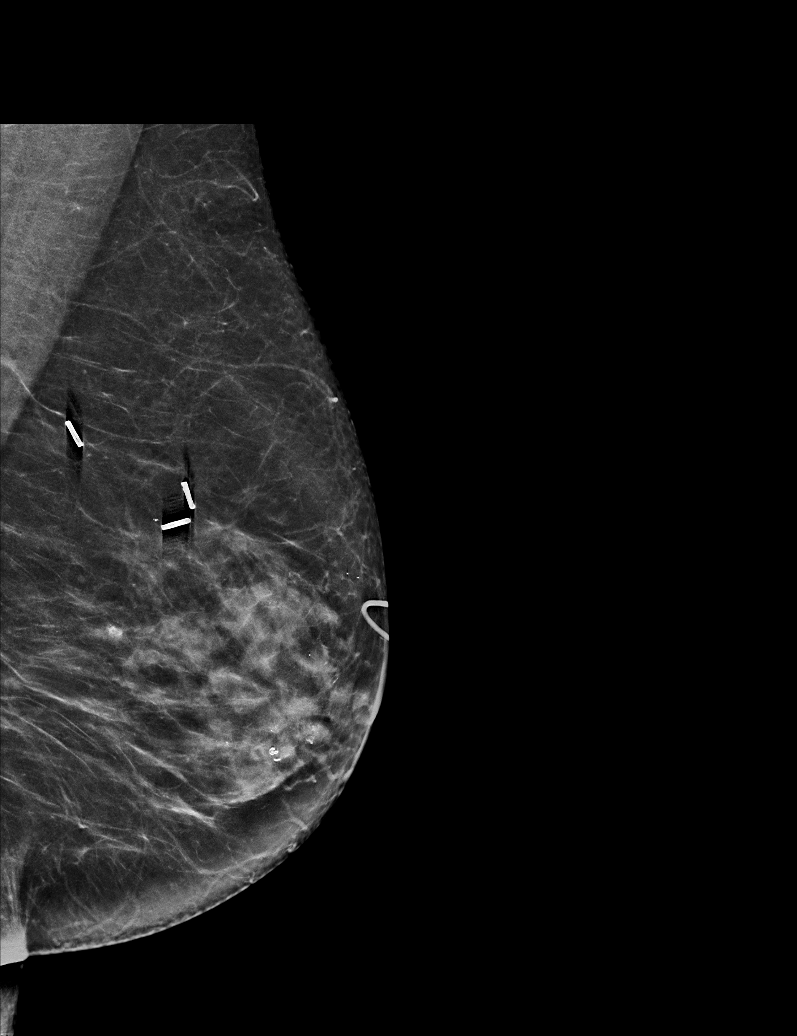

[R MLO synth-2D (1 of 2)]
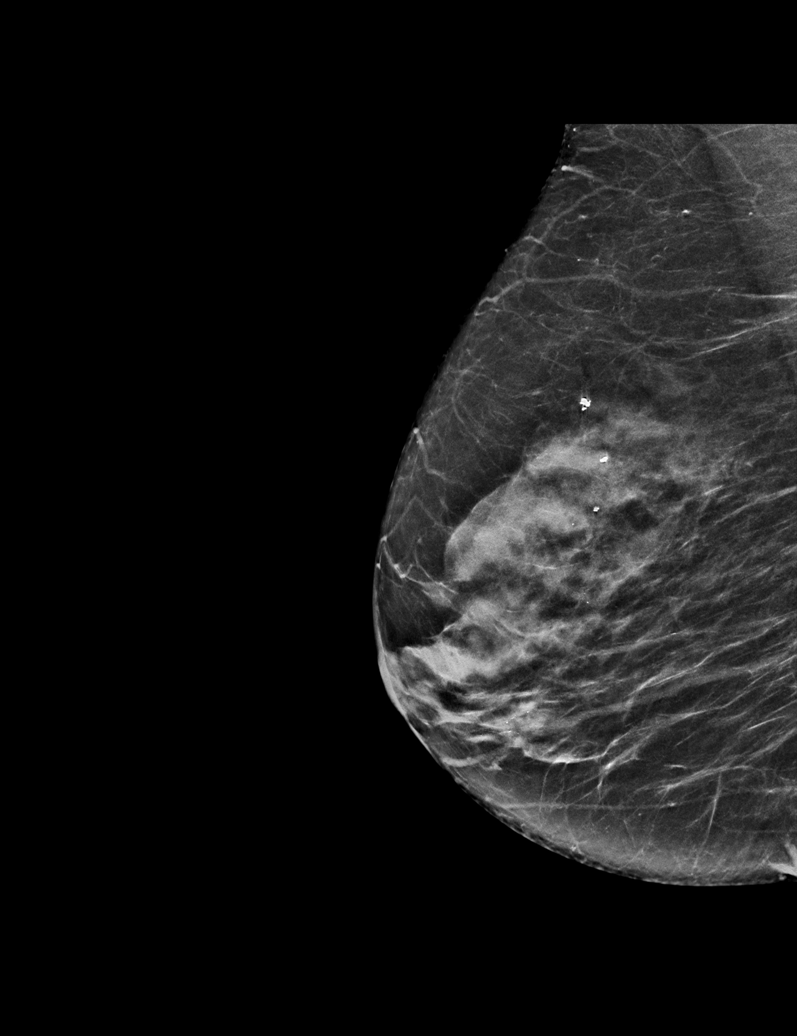

[R MLO synth-2D (2 of 2)]
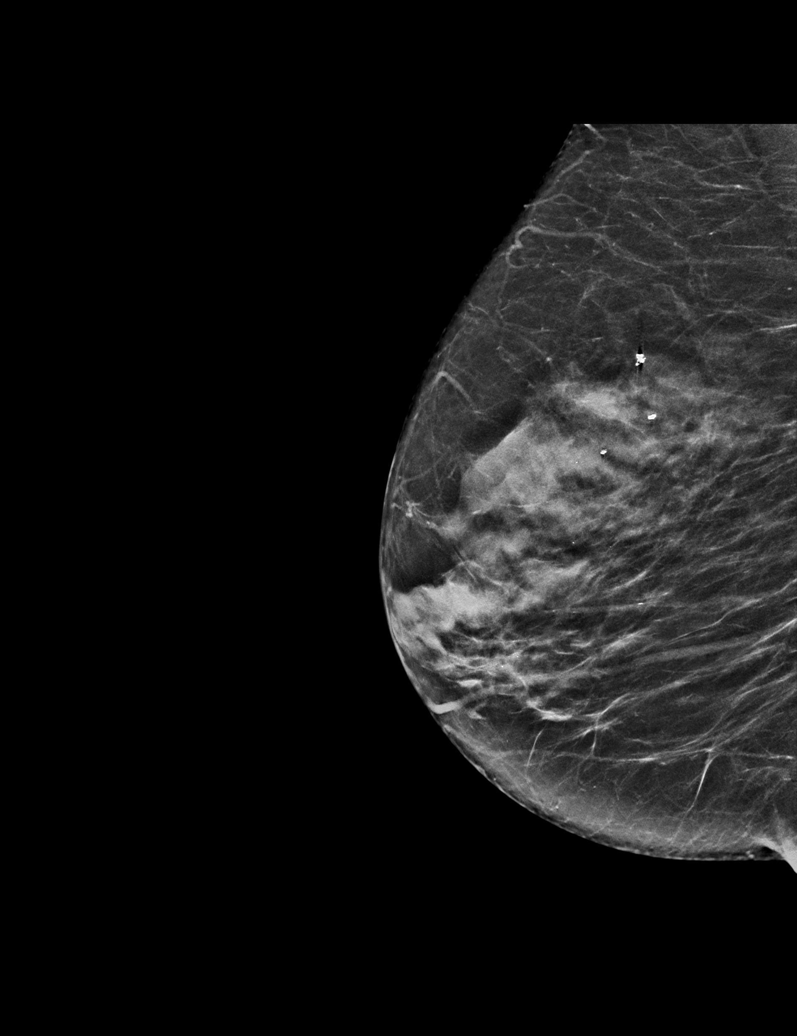

[L CC synth-2D]
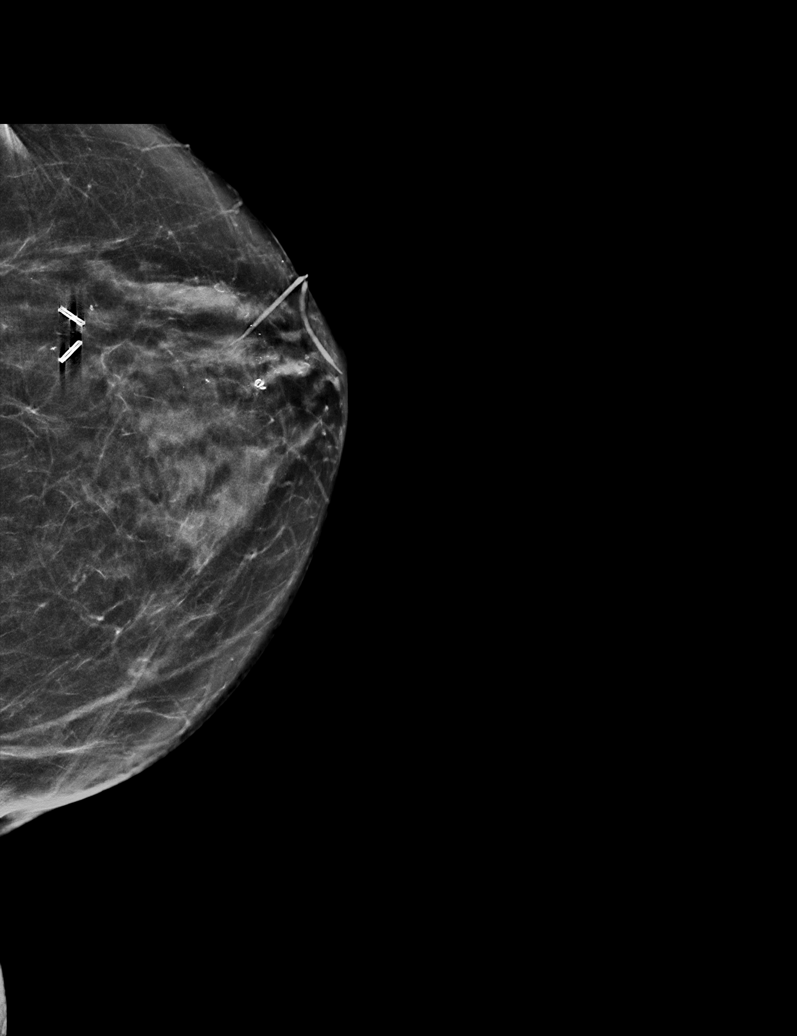

[R CC synth-2D]
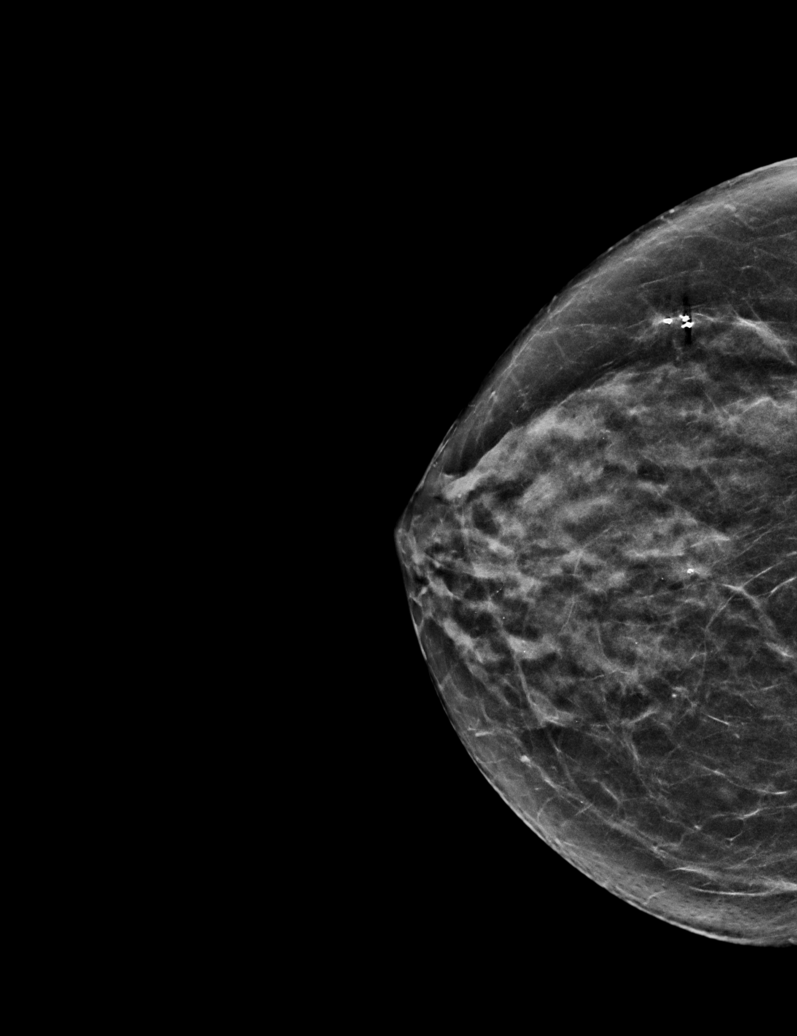

[6 of 31 positions shown; findings below may reference images not displayed]

ACR Breast Density Category c: The breast tissue is heterogeneously
dense, which may obscure small masses.
FINDINGS: 2D and 3D full field views of both breasts and a magnification view
of the lumpectomy site demonstrate no suspicious mass, nonsurgical
distortion or worrisome calcifications.

LEFT lumpectomy changes are again noted.

Mammographic images were processed with CAD.
IMPRESSION: No evidence of breast malignancy.

RECOMMENDATION:
Per protocol, as the patient is now 2 or more years status post
lumpectomy, she may return to annual screening mammography in 1
year. However, given the history of breast cancer, the patient
remains eligible for annual diagnostic mammography if preferred.
(Code:FP-A-4QF)

I have discussed the findings and recommendations with the patient.
If applicable, a reminder letter will be sent to the patient
regarding the next appointment.

BI-RADS CATEGORY  2: Benign.

## 2022-03-08 ENCOUNTER — Ambulatory Visit (HOSPITAL_COMMUNITY)
Admission: RE | Admit: 2022-03-08 | Discharge: 2022-03-08 | Disposition: A | Discharge status: Home / Self Care | Payer: Medicare Other | Source: Ambulatory Visit | Attending: Hematology | Admitting: Hematology

## 2022-03-08 ENCOUNTER — Inpatient Hospital Stay: Payer: Medicare Other | Attending: Hematology

## 2022-03-08 DIAGNOSIS — C50412 Malignant neoplasm of upper-outer quadrant of left female breast: Secondary | ICD-10-CM | POA: Diagnosis present

## 2022-03-08 DIAGNOSIS — Z85118 Personal history of other malignant neoplasm of bronchus and lung: Secondary | ICD-10-CM | POA: Insufficient documentation

## 2022-03-08 DIAGNOSIS — Z170 Estrogen receptor positive status [ER+]: Secondary | ICD-10-CM | POA: Diagnosis present

## 2022-03-08 DIAGNOSIS — M069 Rheumatoid arthritis, unspecified: Secondary | ICD-10-CM | POA: Insufficient documentation

## 2022-03-08 DIAGNOSIS — E559 Vitamin D deficiency, unspecified: Secondary | ICD-10-CM | POA: Diagnosis present

## 2022-03-08 DIAGNOSIS — R232 Flushing: Secondary | ICD-10-CM | POA: Insufficient documentation

## 2022-03-08 DIAGNOSIS — Z79811 Long term (current) use of aromatase inhibitors: Secondary | ICD-10-CM | POA: Insufficient documentation

## 2022-03-08 DIAGNOSIS — C50912 Malignant neoplasm of unspecified site of left female breast: Secondary | ICD-10-CM | POA: Insufficient documentation

## 2022-03-08 DIAGNOSIS — Z923 Personal history of irradiation: Secondary | ICD-10-CM | POA: Insufficient documentation

## 2022-03-08 DIAGNOSIS — M810 Age-related osteoporosis without current pathological fracture: Secondary | ICD-10-CM | POA: Insufficient documentation

## 2022-03-08 LAB — CBC WITH DIFFERENTIAL/PLATELET
Abs Immature Granulocytes: 0 10*3/uL (ref 0.00–0.07)
Basophils Absolute: 0.1 10*3/uL (ref 0.0–0.1)
Basophils Relative: 1 %
Eosinophils Absolute: 0.2 10*3/uL (ref 0.0–0.5)
Eosinophils Relative: 4 %
HCT: 35.7 % — ABNORMAL LOW (ref 36.0–46.0)
Hemoglobin: 11.6 g/dL — ABNORMAL LOW (ref 12.0–15.0)
Immature Granulocytes: 0 %
Lymphocytes Relative: 16 %
Lymphs Abs: 0.9 10*3/uL (ref 0.7–4.0)
MCH: 30.3 pg (ref 26.0–34.0)
MCHC: 32.5 g/dL (ref 30.0–36.0)
MCV: 93.2 fL (ref 80.0–100.0)
Monocytes Absolute: 0.6 10*3/uL (ref 0.1–1.0)
Monocytes Relative: 12 %
Neutro Abs: 3.5 10*3/uL (ref 1.7–7.7)
Neutrophils Relative %: 67 %
Platelets: 249 10*3/uL (ref 150–400)
RBC: 3.83 MIL/uL — ABNORMAL LOW (ref 3.87–5.11)
RDW: 13.3 % (ref 11.5–15.5)
WBC: 5.3 10*3/uL (ref 4.0–10.5)
nRBC: 0 % (ref 0.0–0.2)

## 2022-03-08 LAB — COMPREHENSIVE METABOLIC PANEL
ALT: 49 U/L — ABNORMAL HIGH (ref 0–44)
AST: 28 U/L (ref 15–41)
Albumin: 3.8 g/dL (ref 3.5–5.0)
Alkaline Phosphatase: 80 U/L (ref 38–126)
Anion gap: 7 (ref 5–15)
BUN: 16 mg/dL (ref 8–23)
CO2: 27 mmol/L (ref 22–32)
Calcium: 9.5 mg/dL (ref 8.9–10.3)
Chloride: 107 mmol/L (ref 98–111)
Creatinine, Ser: 0.62 mg/dL (ref 0.44–1.00)
GFR, Estimated: >60 mL/min (ref >60)
Glucose, Bld: 103 mg/dL — ABNORMAL HIGH (ref 70–99)
Potassium: 3.8 mmol/L (ref 3.5–5.1)
Sodium: 141 mmol/L (ref 135–145)
Total Bilirubin: 0.7 mg/dL (ref 0.3–1.2)
Total Protein: 5.2 g/dL — ABNORMAL LOW (ref 6.5–8.1)

## 2022-03-08 LAB — VITAMIN D 25 HYDROXY (VIT D DEFICIENCY, FRACTURES): Vit D, 25-Hydroxy: 50.54 ng/mL (ref 30–100)

## 2022-03-10 LAB — CANCER ANTIGEN 15-3: CA 15-3: 18.1 U/mL (ref 0.0–25.0)

## 2022-03-15 ENCOUNTER — Inpatient Hospital Stay (HOSPITAL_BASED_OUTPATIENT_CLINIC_OR_DEPARTMENT_OTHER): Payer: Medicare Other | Admitting: Hematology

## 2022-03-15 VITALS — BP 151/59 | HR 75 | Temp 98.4°F | Resp 18 | Ht 62.0 in | Wt 125.5 lb

## 2022-03-15 DIAGNOSIS — Z923 Personal history of irradiation: Secondary | ICD-10-CM | POA: Diagnosis not present

## 2022-03-15 DIAGNOSIS — C50412 Malignant neoplasm of upper-outer quadrant of left female breast: Secondary | ICD-10-CM | POA: Diagnosis not present

## 2022-03-15 DIAGNOSIS — E559 Vitamin D deficiency, unspecified: Secondary | ICD-10-CM | POA: Diagnosis not present

## 2022-03-15 DIAGNOSIS — Z85118 Personal history of other malignant neoplasm of bronchus and lung: Secondary | ICD-10-CM | POA: Diagnosis not present

## 2022-03-15 DIAGNOSIS — Z170 Estrogen receptor positive status [ER+]: Secondary | ICD-10-CM

## 2022-03-15 DIAGNOSIS — M810 Age-related osteoporosis without current pathological fracture: Secondary | ICD-10-CM | POA: Diagnosis not present

## 2022-03-15 DIAGNOSIS — C50912 Malignant neoplasm of unspecified site of left female breast: Secondary | ICD-10-CM | POA: Diagnosis present

## 2022-03-15 DIAGNOSIS — Z79811 Long term (current) use of aromatase inhibitors: Secondary | ICD-10-CM | POA: Diagnosis not present

## 2022-03-15 DIAGNOSIS — M069 Rheumatoid arthritis, unspecified: Secondary | ICD-10-CM | POA: Diagnosis not present

## 2022-03-15 DIAGNOSIS — R232 Flushing: Secondary | ICD-10-CM | POA: Diagnosis not present

## 2022-03-15 NOTE — Progress Notes (Signed)
[facility] [address], [place] [address]   Patient Care Team: Pallone, Jarvis Newcomer, MD as PCP - General (Family Medicine) Derek Jack, MD as Medical Oncologist (Medical Oncology)  SUMMARY OF ONCOLOGIC HISTORY: Oncology History  Breast cancer, left ([facility])  04/01/2019 Initial Diagnosis   Breast cancer, left ([facility])   05/14/2019 Cancer Staging   Staging form: Breast, AJCC 8th Edition - Clinical stage from 05/14/2019: Stage IA (cT1c, cN0, cM0, G1, ER+, PR-, HER2-) - Signed by Derek Jack, MD on 05/14/2019     CHIEF COMPLIANT: Follow-up for left breast cancer   INTERVAL HISTORY: Christy Bennett is a 66 y.o. female seen for follow-up of left breast cancer.  She is taking anastrozole without fail.  She has hot flashes at nighttime which occasionally disrupt her sleep briefly.  She reports that she is continuing Prolia injections every 6 months.  No new onset pains reported.  REVIEW OF SYSTEMS:   Review of Systems  Constitutional:  Negative for appetite change and fatigue.  Psychiatric/Behavioral:  The patient is nervous/anxious.   All other systems reviewed and are negative.   I have reviewed the past medical history, past surgical history, social history and family history with the patient and they are unchanged from previous note.   ALLERGIES:   is allergic to amoxicillin-pot clavulanate.   MEDICATIONS:  Current Outpatient Medications  Medication Sig Dispense Refill   anastrozole (ARIMIDEX) 1 MG tablet Take 1 tablet by mouth once daily 90 tablet 3   arformoterol (BROVANA) 15 MCG/2ML NEBU Inhale into the lungs.     aspirin EC 81 MG tablet Take 81 mg by mouth daily.     b complex vitamins capsule Take 1 capsule by mouth daily.     buPROPion (WELLBUTRIN SR) 150 MG 12 hr tablet Take 150 mg by mouth 2 (two) times daily.     cetirizine (ZYRTEC) 10 MG tablet Take 10 mg by mouth daily.     clopidogrel (PLAVIX) 75 MG tablet Take 75 mg by  mouth daily.     denosumab (PROLIA) 60 MG/ML SOSY injection Inject 60 mg into the skin every 6 (six) months.     diclofenac Sodium (VOLTAREN) 1 % GEL Apply topically.     docusate sodium (COLACE) 50 MG capsule Take 50 mg by mouth as needed for mild constipation.     esomeprazole (NEXIUM) 40 MG capsule Take 1 tablet by mouth daily.     hydroxychloroquine (PLAQUENIL) 200 MG tablet Take 250 mg by mouth daily. Pt 1 1/2 tablet daily     leflunomide (ARAVA) 20 MG tablet Take 1 tablet by mouth daily.     losartan (COZAAR) 25 MG tablet Take 25 mg by mouth daily.     revefenacin (YUPELRI) 175 MCG/3ML nebulizer solution Inhale into the lungs.     simvastatin (ZOCOR) 10 MG tablet Take 10 mg by mouth daily.     sulfaSALAzine (AZULFIDINE) 500 MG tablet Take 500 mg by mouth 3 (three) times daily. Taking 545m tablet in the morning and 2 tablets in the evening     traZODone (DESYREL) 100 MG tablet Take 100 mg by mouth at bedtime.     verapamil (CALAN-SR) 240 MG CR tablet Take 240 mg by mouth daily.     vitamin E 400 UNIT capsule Take 400 Units by mouth daily. Pt takes 450 mg daily     No current facility-administered medications for this visit.     PHYSICAL EXAMINATION: Performance status (ECOG):  1 - Symptomatic but completely ambulatory  Vitals:   03/15/22 1535  BP: (!) 151/59  Pulse: 75  Resp: 18  Temp: 98.4 F (36.9 C)  SpO2: 97%   Wt Readings from Last 3 Encounters:  03/15/22 125 lb 8 oz (56.9 kg)  09/16/21 121 lb 6.4 oz (55.1 kg)  03/11/21 125 lb 6.4 oz (56.9 kg)   Physical Exam Vitals reviewed.  Constitutional:      Appearance: Normal appearance.  Cardiovascular:     Rate and Rhythm: Normal rate and regular rhythm.     Pulses: Normal pulses.     Heart sounds: Normal heart sounds.  Pulmonary:     Effort: Pulmonary effort is normal.     Breath sounds: Normal breath sounds.  Chest:  Breasts:    Right: No swelling, bleeding, inverted nipple, mass, nipple discharge, skin change or  tenderness.     Left: No swelling, bleeding, inverted nipple, mass, nipple discharge, skin change (UOQ around areola lumpectomy scar well healed) or tenderness.  Lymphadenopathy:     Upper Body:     Right upper body: No supraclavicular or axillary adenopathy.     Left upper body: No supraclavicular or axillary adenopathy.  Neurological:     General: No focal deficit present.     Mental Status: She is alert and oriented to person, place, and time.  Psychiatric:        Mood and Affect: Mood normal.        Behavior: Behavior normal.     Breast Exam Chaperone: Thana Ates     LABORATORY DATA:  I have reviewed the data as listed    Latest Ref Rng & Units 03/08/2022   11:15 AM 09/09/2021    1:38 PM 03/05/2021   11:37 AM  CMP  Glucose 70 - 99 mg/dL 103  87  107   BUN 8 - 23 mg/dL 16  16  16    Creatinine 0.44 - 1.00 mg/dL 0.62  0.62  0.75   Sodium 135 - 145 mmol/L 141  140  140   Potassium 3.5 - 5.1 mmol/L 3.8  4.0  3.8   Chloride 98 - 111 mmol/L 107  106  106   CO2 22 - 32 mmol/L 27  29  28    Calcium 8.9 - 10.3 mg/dL 9.5  9.4  8.7   Total Protein 6.5 - 8.1 g/dL 5.2  6.0  5.2   Total Bilirubin 0.3 - 1.2 mg/dL 0.7  0.7  0.5   Alkaline Phos 38 - 126 U/L 80  76  82   AST 15 - 41 U/L 28  26  28    ALT 0 - 44 U/L 49  30  36    Lab Results  Component Value Date   CAN153 18.1 03/08/2022   CAN153 19.9 09/09/2021   CAN153 20.0 03/05/2021   Lab Results  Component Value Date   WBC 5.3 03/08/2022   HGB 11.6 (L) 03/08/2022   HCT 35.7 (L) 03/08/2022   MCV 93.2 03/08/2022   PLT 249 03/08/2022   NEUTROABS 3.5 03/08/2022    ASSESSMENT:  1.  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 -Lumpectomy and sentinel lymph node biopsy on 04/23/2019 by Dr. Donne Hazel shows grade 1, 1.5 cm invasive lobular carcinoma,  margins negative, 1 sentinel lymph node negative, ER more than 90%, PR  negative, HER-2 negative, Ki-67 not done.  PT1CPN0. -Anastrozole started around 05/14/2019. -She was seen by radiation oncology in [employer].  As the absolute reduction in local recurrence was low at 3% with radiation, patient opted to not to undergo XRT.   2.  Stage III (T3 N2 M0) non-small cell lung cancer, adenocarcinoma type: -She was treated with chemoradiation therapy with cisplatin VP-16 from 02/18/2008 through 04/25/2008. -CT chest on 02/25/2019 showed scarring in the right upper medial lung consistent with history of prior treatment.  3 cm left lower lobe lung cyst is unchanged.  No adenopathy. - CT chest (09/08/2020): Done at [facility] did not show any evidence of recurrence.  Bilateral patchy infiltrates suggestive of infectious process.   3.  Osteoporosis: -Started on Prolia in September 2020, receives in [place].  This was prescribed by her rheumatologist in [place]. -She will continue calcium and vitamin D supplements.     PLAN:  1.  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  Left lumpectomy scar in the upper outer quadrant around the areola is within normal limits.  No palpable adenopathy. - Reviewed mammogram from 03/08/2022 which was BI-RADS Category 2. - Labs show elevated ALT of 49.  She had mildly elevated ALT in 2022 which also normalized.  Most likely medication induced.  Other LFTs are normal. - Continue anastrozole.  She has some hot flashes at nighttime which sometimes make her wake up.  However she tells me that she can tolerate them.  If they get worse, will consider gabapentin 300 mg at bedtime. - RTC 6 months for follow-up with repeat labs.   2.  Stage III (T3 N2 M0) non-small cell lung cancer, adenocarcinoma type: - CT scan from 09/08/2020 done at [facility] did not show any  evidence of recurrence.  Bilateral patchy infiltrates suggestive of infectious process for which she was treated.   3.  Osteoporosis: -Continue Prolia in [place] under the direction of Dr. Koleen Nimrod, rheumatology in [place]. -Vitamin D level was normal at 50.  Continue calcium and vitamin D supplements.   4.  Rheumatoid arthritis: -Continue Arava, Plaquenil and sulfasalazine (Dr. Koleen Nimrod in [place]).  Breast Cancer therapy associated bone loss: I have recommended calcium, Vitamin D and weight bearing exercises.  Orders placed this encounter:  Orders Placed This Encounter  Procedures   CBC with Differential/Platelet   Comprehensive metabolic panel   VITAMIN D 25 Hydroxy (Vit-D Deficiency, Fractures)   Cancer antigen 15-3    The patient has a good understanding of the overall plan. She agrees with it. She will call with any problems that may develop before the next visit here.  Derek Jack, MD [facility] [phone]   I, Thana Ates, am acting as a scribe for Dr. Derek Jack.  I, Derek Jack MD, have reviewed the above documentation for accuracy and completeness, and I agree with the above.

## 2022-03-15 NOTE — Patient Instructions (Signed)
[facility]  Discharge Instructions  You were seen and examined today by Dr. Delton Coombes.  Dr. Delton Coombes discussed your most recent lab work which revealed that everything looks good except one of your liver counts is slightly elevated but this has been up before so nothing to worry about.  Continue having your Prolia injections.  Follow-up as scheduled in 6 months.   Thank you for choosing [facility] to provide your oncology and hematology care.   To afford each patient quality time with our provider, please arrive at least 15 minutes before your scheduled appointment time. You may need to reschedule your appointment if you arrive late (10 or more minutes). Arriving late affects you and other patients whose appointments are after yours.  Also, if you miss three or more appointments without notifying the office, you may be dismissed from the clinic at the provider's discretion.    Again, thank you for choosing [facility].  Our hope is that these requests will decrease the amount of time that you wait before being seen by our physicians.   If you have a lab appointment with the [facility] please come in thru the Main Entrance and check in at the main information desk.           _____________________________________________________________  Should you have questions after your visit to [facility], please contact our office at [phone] and follow the prompts.  Our office hours are 8:00 a.m. to 4:30 p.m. Monday - Thursday and 8:00 a.m. to 2:30 p.m. Friday.  Please note that voicemails left after 4:00 p.m. may not be returned until the following business day.  We are closed weekends and all major holidays.  You do have access to a nurse 24-7, just call the main number to the clinic [phone] and do not press any options, hold on the line and a nurse will answer the phone.    For prescription refill  requests, have your pharmacy contact our office and allow 72 hours.    Masks are optional in the cancer centers. If you would like for your care team to wear a mask while they are taking care of you, please let them know. You may have one support person who is at least 68 years old accompany you for your appointments.

## 2022-06-27 ENCOUNTER — Telehealth: Payer: Self-pay | Admitting: *Deleted

## 2022-06-27 ENCOUNTER — Other Ambulatory Visit: Payer: Self-pay | Admitting: Hematology

## 2022-06-27 DIAGNOSIS — Z170 Estrogen receptor positive status [ER+]: Secondary | ICD-10-CM

## 2022-06-27 NOTE — Telephone Encounter (Signed)
Anastrozole refill approved.  Patient is tolerating and is to continue therapy.

## 2022-07-28 ENCOUNTER — Encounter: Payer: Self-pay | Admitting: Internal Medicine

## 2022-07-28 ENCOUNTER — Ambulatory Visit (INDEPENDENT_AMBULATORY_CARE_PROVIDER_SITE_OTHER): Payer: Medicare Other | Admitting: Internal Medicine

## 2022-07-28 ENCOUNTER — Ambulatory Visit (INDEPENDENT_AMBULATORY_CARE_PROVIDER_SITE_OTHER): Payer: Medicare Other

## 2022-07-28 VITALS — BP 114/62 | HR 77 | Temp 97.7°F | Ht 62.0 in | Wt 118.4 lb

## 2022-07-28 DIAGNOSIS — J449 Chronic obstructive pulmonary disease, unspecified: Secondary | ICD-10-CM

## 2022-07-28 MED ORDER — PREDNISONE 10 MG PO TABS: ORAL_TABLET | ORAL | 0 refills | Status: DC

## 2022-07-28 NOTE — Progress Notes (Signed)
Christy Bennett, female    DOB: October 26, 1955   MRN: [id number]   Brief patient profile:  65  yowf quit smoking 2016  self referred to pulmonary clinic 07/28/2022  with doe/fatigue post covid       Dx with lung ca 2009 RT to Right  and chemo in [place] with scarring RUL on last cxrs from 2022    History of Present Illness  07/28/2022  Pulmonary/ 1st office eval/Jocelynne Duquette  Chief Complaint  Patient presents with   Consult    Self referral, sob with exertion, decreased energy, had Covid 1 mth. ago  Baseline= yardwork / back and forth to [place] of [place] stop twice walking up to a mile in 30 min slt inclines  then covid Jun 24 2022 fatigue, cough, st, green mucus from nose and after a few weeks main problem fatigue   Dyspnea:  back doing yardwork / hasn't tried [place]/  short of breath but not stopping at top of steps   Cough: some cough at hs min white no green/ some am mucus p few min Sleep: level bed with pillows. SABA use: on neb brovana/ yupelri  but some better p pred from [facility]  No obvious day to day or daytime pattern/variability or assoc ongoing purulent sputum or mucus plugs or hemoptysis or cp or chest tightness, subjective wheeze or overt sinus or hb symptoms.   Sleeping now without nocturnal  or early am exacerbation  of respiratory  c/o's or need for noct saba. Also denies any obvious fluctuation of symptoms with weather or environmental changes or other aggravating or alleviating factors except as outlined above   No unusual exposure hx or h/o childhood pna/ asthma or knowledge of premature birth.  Current Allergies, Complete Past Medical History, Past Surgical History, Family History, and Social History were reviewed in [employer] record.  ROS  The following are not active complaints unless bolded Hoarseness, sore throat, dysphagia, dental problems, itching, sneezing,  nasal congestion or discharge of excess mucus or purulent secretions, ear  ache,   fever, chills, sweats, unintended wt loss or wt gain, classically pleuritic or exertional cp,  orthopnea pnd or arm/hand swelling  or leg swelling, presyncope, palpitations, abdominal pain, anorexia, nausea, vomiting, diarrhea  or change in bowel habits or change in bladder habits, change in stools or change in urine, dysuria, hematuria,  rash, arthralgias, visual complaints, headache, numbness, weakness or ataxia or problems with walking or coordination,  change in mood or  memory.           Past Medical History:  Diagnosis Date   Allergy    COPD (chronic obstructive pulmonary disease) ([facility])    Depression    GERD (gastroesophageal reflux disease)    Hyperlipemia    Hypertension    Hypothyroid    Lung cancer ([facility]) 2009   Osteoporosis    PVD (peripheral vascular disease) ([facility])    RA (rheumatoid arthritis) ([facility])     Outpatient Medications Prior to Visit  Medication Sig Dispense Refill   albuterol (PROAIR HFA) 108 (90 Base) MCG/ACT inhaler Inhale 2 puffs into the lungs every 4 (four) hours as needed.     anastrozole (ARIMIDEX) 1 MG tablet Take 1 tablet by mouth once daily 90 tablet 0   arformoterol (BROVANA) 15 MCG/2ML NEBU Inhale into the lungs.     aspirin EC 81 MG tablet Take 81 mg by mouth daily.     b complex vitamins capsule Take  1 capsule by mouth daily.     buPROPion (WELLBUTRIN SR) 150 MG 12 hr tablet Take 150 mg by mouth 2 (two) times daily.     cetirizine (ZYRTEC) 10 MG tablet Take 10 mg by mouth daily.     clopidogrel (PLAVIX) 75 MG tablet Take 75 mg by mouth daily.     denosumab (PROLIA) 60 MG/ML SOSY injection Inject 60 mg into the skin every 6 (six) months.     diclofenac Sodium (VOLTAREN) 1 % GEL Apply topically.     docusate sodium (COLACE) 50 MG capsule Take 50 mg by mouth as needed for mild constipation.     esomeprazole (NEXIUM) 40 MG capsule Take 1 tablet by mouth daily.     hydroxychloroquine (PLAQUENIL) 200 MG tablet Take 250 mg by mouth daily. Pt 1 1/2  tablet daily     leflunomide (ARAVA) 20 MG tablet Take 1 tablet by mouth daily.     losartan (COZAAR) 25 MG tablet Take 25 mg by mouth daily.     revefenacin (YUPELRI) 175 MCG/3ML nebulizer solution Inhale into the lungs.     simvastatin (ZOCOR) 10 MG tablet Take 10 mg by mouth daily.     sulfaSALAzine (AZULFIDINE) 500 MG tablet Take 500 mg by mouth 3 (three) times daily. Taking '500mg'$  tablet in the morning and 2 tablets in the evening     traZODone (DESYREL) 100 MG tablet Take 100 mg by mouth at bedtime.     verapamil (CALAN-SR) 240 MG CR tablet Take 240 mg by mouth daily.     vitamin E 400 UNIT capsule Take 400 Units by mouth daily. Pt takes 450 mg daily     No facility-administered medications prior to visit.     Objective:     BP 114/62 (BP Location: Right Arm, Cuff Size: Normal)   Pulse 77   Temp 97.7 F (36.5 C) (Temporal)   Ht '5\' 2"'$  (1.575 m)   Wt 118 lb 6.4 oz (53.7 kg)   SpO2 97% Comment: RA  BMI 21.66 kg/m   SpO2: 97 % (RA)  Amb wf slight rattle   HEENT : Oropharynx  clear   Nasal turbinates nl    NECK :  without  apparent JVD/ palpable Nodes/TM    LUNGS: no acc muscle use,  Mild barrel  contour chest wall with bilateral  Distant bs s audible wheeze and  without cough on insp or exp maneuvers  and mild  Hyperresonant  to  percussion bilaterally     CV:  RRR  no s3 or murmur or increase in P2, and no edema   ABD:  soft and nontender with pos end  insp Hoover's  in the supine position.  No bruits or organomegaly appreciated   MS:  Nl gait/ ext warm without deformities Or obvious joint restrictions  calf tenderness, cyanosis or clubbing     SKIN: warm and dry without lesions    NEURO:  alert, approp, nl sensorium with  no motor or cerebellar deficits apparent.     CXR PA and Lateral:   07/28/2022 :    I personally reviewed images and impression is as follows:     Scarring R apex with vol loss on R / mod copd/ no acute changes      Assessment   No  problem-specific Assessment & Plan notes found for this encounter.     [name], MD 07/28/2022

## 2022-07-28 NOTE — Patient Instructions (Addendum)
Try yupelri and brovana 1st thing in am and about 12 hours later use the brovana   Also  Ok to try albuterol 15 min before an activity (on alternating days)  that you know would usually make you short of breath and see if it makes any difference and if makes none then don't take albuterol after activity unless you can't catch your breath as this means it's the resting that helps, not the albuterol.      If not satisfied try Prednisone 10 mg 2 daily x 5 days, 1 daily x 5 days and stop   Please remember to go to the  x-ray department  for your tests - we will call you with the results when they are available    Please schedule a follow up office visit in 6 weeks, call sooner if needed in  [place] - bring inhaler

## 2022-07-29 ENCOUNTER — Encounter: Payer: Self-pay | Admitting: Internal Medicine

## 2022-07-29 NOTE — Assessment & Plan Note (Signed)
Quit smoking 2016 - 07/28/2022   Walked on RA  x  3  lap(s) =  approx 750  ft  @ avg pace, stopped due to end of study with lowest 02 sats 96% s sob    Likely Pt is Group B in terms of symptom/risk and laba/lama therefore appropriate rx at this point >>>  yupelri/brovana q am and 12 hours later repeat brovana with prn saba  Re SABA :  I spent extra time with pt today reviewing appropriate use of albuterol for prn use on exertion with the following points: 1) saba is for relief of sob that does not improve by walking a slower pace or resting but rather if the pt does not improve after trying this first. 2) If the pt is convinced, as many are, that saba helps recover from activity faster then it's easy to tell if this is the case by re-challenging : ie stop, take the inhaler, then p 5 minutes try the exact same activity (intensity of workload) that just caused the symptoms and see if they are substantially diminished or not after saba 3) if there is an activity that reproducibly causes the symptoms, try the saba 15 min before the activity on alternate days   If in fact the saba really does help, then fine to continue to use it prn but advised may need to look closer at the maintenance regimen being used to achieve better control of airways disease with exertion.   If not back to baseline can try short course of pred and f/u in [place] office in 6 weeks and bring PFTs with her if possible.   Each maintenance medication was reviewed in detail including emphasizing most importantly the difference between maintenance and prns and under what circumstances the prns are to be triggered using an action plan format where appropriate.  Total time for H and P, chart review, counseling, reviewing hfa/neb device(s) , directly observing portions of ambulatory 02 saturation study/ and generating customized AVS unique to this office visit / same day charting = 46 min with pt new to me

## 2022-09-07 NOTE — Progress Notes (Unsigned)
Christy Bennett, female    DOB: January 03, 1956   MRN: [id number]   Brief patient profile:  6 yowf quit smoking 2016  self referred to pulmonary clinic 07/28/2022  with doe/fatigue post covid       Dx with lung ca 2009 RT to Right  and chemo in [place] with scarring RUL on last cxrs from 2022    History of Present Illness  07/28/2022  Pulmonary/ 1st office eval/Christy Bennett  Chief Complaint  Patient presents with   Consult    Self referral, sob with exertion, decreased energy, had Covid 1 mth. ago  Baseline= yardwork / back and forth to [place] of [place] stop twice walking up to a mile in 30 min slt inclines  then covid Jun 24 2022 fatigue, cough, st, green mucus from nose and after a few weeks main problem fatigue   Dyspnea:  back doing yardwork / hasn't tried cul [place]/  short of breath but not stopping at top of steps   Cough: some cough at hs min white no green/ some am mucus p few min Sleep: level bed with pillows. SABA use: on neb brovana/ yupelri  but some better p pred from [facility] Rec Try yupelri and brovana 1st thing in am and about 12 hours later use the brovana  Also  Ok to try albuterol 15 min before an activity (on alternating days)  that you know would usually make you short of breath    If not satisfied try Prednisone 10 mg 2 daily x 5 days, 1 daily x 5 days and stop   Please schedule a follow up office visit in 6 weeks, call sooner if needed in  [place] - bring inhaler    09/08/2022  f/u ov/[place] office/Christy Bennett re: doe/ fatigue post covid maint on lama/laba bid neb   Chief Complaint  Patient presents with   Follow-up    Pt f/u states that her breathing has been good recently    Dyspnea:  yard work is fine  Cough: minimal  Sleeping: level bed / 2 pillows SABA use: rarely  - doe not help prior to ex  02: none    No obvious day to day or daytime variability or assoc excess/ purulent sputum or mucus plugs or hemoptysis or cp or chest tightness, subjective wheeze  or overt sinus or hb symptoms.   Sleeping ok  without nocturnal  or early am exacerbation  of respiratory  c/o's or need for noct saba. Also denies any obvious fluctuation of symptoms with weather or environmental changes or other aggravating or alleviating factors except as outlined above   No unusual exposure hx or h/o childhood pna/ asthma or knowledge of premature birth.  Current Allergies, Complete Past Medical History, Past Surgical History, Family History, and Social History were reviewed in [employer] record.  ROS  The following are not active complaints unless bolded Hoarseness, sore throat, dysphagia to point of choking and needing to induce vomit , dental problems, itching, sneezing,  nasal congestion or discharge of excess mucus or purulent secretions, ear ache,   fever, chills, sweats, unintended wt loss or wt gain, classically pleuritic or exertional cp,  orthopnea pnd or arm/hand swelling  or leg swelling, presyncope, palpitations, abdominal pain, anorexia, nausea, vomiting, diarrhea  or change in bowel habits or change in bladder habits, change in stools or change in urine, dysuria, hematuria,  rash, arthralgias, visual complaints, headache, numbness, weakness or ataxia or problems with walking or  coordination,  change in mood or  memory.        Current Meds  Medication Sig   albuterol (PROAIR HFA) 108 (90 Base) MCG/ACT inhaler Inhale 2 puffs into the lungs every 4 (four) hours as needed.   anastrozole (ARIMIDEX) 1 MG tablet Take 1 tablet by mouth once daily   arformoterol (BROVANA) 15 MCG/2ML NEBU Inhale into the lungs.   aspirin EC 81 MG tablet Take 81 mg by mouth daily.   b complex vitamins capsule Take 1 capsule by mouth daily.   buPROPion (WELLBUTRIN SR) 150 MG 12 hr tablet Take 150 mg by mouth 2 (two) times daily.   cetirizine (ZYRTEC) 10 MG tablet Take 10 mg by mouth daily.   clopidogrel (PLAVIX) 75 MG tablet Take 75 mg by mouth daily.   denosumab  (PROLIA) 60 MG/ML SOSY injection Inject 60 mg into the skin every 6 (six) months.   diclofenac Sodium (VOLTAREN) 1 % GEL Apply topically.   docusate sodium (COLACE) 50 MG capsule Take 50 mg by mouth as needed for mild constipation.   esomeprazole (NEXIUM) 40 MG capsule Take 1 tablet by mouth daily.   hydroxychloroquine (PLAQUENIL) 200 MG tablet Take 250 mg by mouth daily. Pt 1 1/2 tablet daily   leflunomide (ARAVA) 20 MG tablet Take 1 tablet by mouth daily.   levothyroxine (SYNTHROID) 137 MCG tablet Take 1 tablet by mouth daily.   losartan (COZAAR) 25 MG tablet Take 25 mg by mouth daily.   revefenacin (YUPELRI) 175 MCG/3ML nebulizer solution Inhale into the lungs.   simvastatin (ZOCOR) 10 MG tablet Take 10 mg by mouth daily.   sulfaSALAzine (AZULFIDINE) 500 MG tablet Take 500 mg by mouth 3 (three) times daily. Taking 500mg  tablet in the morning and 2 tablets in the evening   traZODone (DESYREL) 100 MG tablet Take 100 mg by mouth at bedtime.   verapamil (CALAN-SR) 240 MG CR tablet Take 240 mg by mouth daily.   vitamin E 400 UNIT capsule Take 400 Units by mouth daily. Pt takes 450 mg daily           Past Medical History:  Diagnosis Date   Allergy    COPD (chronic obstructive pulmonary disease) (HCC)    Depression    GERD (gastroesophageal reflux disease)    Hyperlipemia    Hypertension    Hypothyroid    Lung cancer (HCC) 2009   Osteoporosis    PVD (peripheral vascular disease) (HCC)    RA (rheumatoid arthritis) (HCC)       Objective:    Wts   09/08/2022       121   07/28/22 118 lb 6.4 oz (53.7 kg)  03/15/22 125 lb 8 oz (56.9 kg)  09/16/21 121 lb 6.4 oz (55.1 kg)      Vital signs reviewed  09/08/2022  - Note at rest 02 sats  92% on RA   General appearance:    robust amb wf nad       HEENT : Oropharynx  clear   Nasal turbinates nl    NECK :  without  apparent JVD/ palpable Nodes/TM    LUNGS: no acc muscle use,  Min barrel  contour chest wall with bilateral  slightly  decreased bs s audible wheeze and  without cough on insp or exp maneuvers and min  Hyperresonant  to  percussion bilaterally    CV:  RRR  no s3 or murmur or increase in P2, and no edema   ABD:  soft and nontender with pos end  insp Hoover's  in the supine position.  No bruits or organomegaly appreciated   MS:  Nl gait/ ext warm without deformities Or obvious joint restrictions  calf tenderness, cyanosis or clubbing     SKIN: warm and dry without lesions    NEURO:  alert, approp, nl sensorium with  no motor or cerebellar deficits apparent.           Assessment

## 2022-09-08 ENCOUNTER — Ambulatory Visit (INDEPENDENT_AMBULATORY_CARE_PROVIDER_SITE_OTHER): Payer: Medicare Other | Admitting: Internal Medicine

## 2022-09-08 ENCOUNTER — Inpatient Hospital Stay: Payer: Medicare Other | Attending: Hematology

## 2022-09-08 ENCOUNTER — Encounter: Payer: Self-pay | Admitting: Internal Medicine

## 2022-09-08 VITALS — BP 120/54 | HR 83 | Ht 62.0 in | Wt 121.4 lb

## 2022-09-08 DIAGNOSIS — Z853 Personal history of malignant neoplasm of breast: Secondary | ICD-10-CM | POA: Insufficient documentation

## 2022-09-08 DIAGNOSIS — J449 Chronic obstructive pulmonary disease, unspecified: Secondary | ICD-10-CM

## 2022-09-08 DIAGNOSIS — Z85118 Personal history of other malignant neoplasm of bronchus and lung: Secondary | ICD-10-CM | POA: Insufficient documentation

## 2022-09-08 DIAGNOSIS — D649 Anemia, unspecified: Secondary | ICD-10-CM | POA: Insufficient documentation

## 2022-09-08 DIAGNOSIS — Z87891 Personal history of nicotine dependence: Secondary | ICD-10-CM | POA: Diagnosis not present

## 2022-09-08 DIAGNOSIS — M069 Rheumatoid arthritis, unspecified: Secondary | ICD-10-CM | POA: Diagnosis not present

## 2022-09-08 DIAGNOSIS — Z79811 Long term (current) use of aromatase inhibitors: Secondary | ICD-10-CM | POA: Insufficient documentation

## 2022-09-08 DIAGNOSIS — C50412 Malignant neoplasm of upper-outer quadrant of left female breast: Secondary | ICD-10-CM

## 2022-09-08 DIAGNOSIS — M810 Age-related osteoporosis without current pathological fracture: Secondary | ICD-10-CM | POA: Diagnosis not present

## 2022-09-08 DIAGNOSIS — R1314 Dysphagia, pharyngoesophageal phase: Secondary | ICD-10-CM

## 2022-09-08 DIAGNOSIS — R1310 Dysphagia, unspecified: Secondary | ICD-10-CM | POA: Insufficient documentation

## 2022-09-08 DIAGNOSIS — E559 Vitamin D deficiency, unspecified: Secondary | ICD-10-CM

## 2022-09-08 LAB — CBC WITH DIFFERENTIAL/PLATELET
Abs Immature Granulocytes: 0.01 10*3/uL (ref 0.00–0.07)
Basophils Absolute: 0.1 10*3/uL (ref 0.0–0.1)
Basophils Relative: 2 %
Eosinophils Absolute: 0.3 10*3/uL (ref 0.0–0.5)
Eosinophils Relative: 6 %
HCT: 34.1 % — ABNORMAL LOW (ref 36.0–46.0)
Hemoglobin: 11 g/dL — ABNORMAL LOW (ref 12.0–15.0)
Immature Granulocytes: 0 %
Lymphocytes Relative: 19 %
Lymphs Abs: 0.9 10*3/uL (ref 0.7–4.0)
MCH: 32 pg (ref 26.0–34.0)
MCHC: 32.3 g/dL (ref 30.0–36.0)
MCV: 99.1 fL (ref 80.0–100.0)
Monocytes Absolute: 0.7 10*3/uL (ref 0.1–1.0)
Monocytes Relative: 15 %
Neutro Abs: 2.7 10*3/uL (ref 1.7–7.7)
Neutrophils Relative %: 58 %
Platelets: 220 10*3/uL (ref 150–400)
RBC: 3.44 MIL/uL — ABNORMAL LOW (ref 3.87–5.11)
RDW: 14 % (ref 11.5–15.5)
WBC: 4.6 10*3/uL (ref 4.0–10.5)
nRBC: 0 % (ref 0.0–0.2)

## 2022-09-08 LAB — COMPREHENSIVE METABOLIC PANEL
ALT: 25 U/L (ref 0–44)
AST: 25 U/L (ref 15–41)
Albumin: 4 g/dL (ref 3.5–5.0)
Alkaline Phosphatase: 67 U/L (ref 38–126)
Anion gap: 9 (ref 5–15)
BUN: 24 mg/dL — ABNORMAL HIGH (ref 8–23)
CO2: 26 mmol/L (ref 22–32)
Calcium: 9.3 mg/dL (ref 8.9–10.3)
Chloride: 102 mmol/L (ref 98–111)
Creatinine, Ser: 0.8 mg/dL (ref 0.44–1.00)
GFR, Estimated: >60 mL/min (ref >60)
Glucose, Bld: 85 mg/dL (ref 70–99)
Potassium: 3.8 mmol/L (ref 3.5–5.1)
Sodium: 137 mmol/L (ref 135–145)
Total Bilirubin: 0.7 mg/dL (ref 0.3–1.2)
Total Protein: 4.8 g/dL — ABNORMAL LOW (ref 6.5–8.1)

## 2022-09-08 LAB — VITAMIN D 25 HYDROXY (VIT D DEFICIENCY, FRACTURES): Vit D, 25-Hydroxy: 64.24 ng/mL (ref 30–100)

## 2022-09-08 NOTE — Assessment & Plan Note (Signed)
Quit smoking 2016 - 07/28/2022   Walked on RA  x  3  lap(s) =  approx 750  ft  @ avg pace, stopped due to end of study with lowest 02 sats 96% s sob    Pt is Group B in terms of symptom/risk and laba/lama therefore appropriate rx at this point >>>  continue lama/laba q am with laba in pm optional as long as doing great  Return in 6 m with pfts same day, call sooner prn

## 2022-09-08 NOTE — Assessment & Plan Note (Signed)
Referred to [facility]  GI 09/08/2022   She has ungone egd with dilation distant past for same problem which gave her temporary relief but wants to be seen in [facility] for both IM and GI so made approp referrals and in meantime rec  Dietary restrictions reviewed Continue ppi ac  To ER if worsens while awaiting GI eval as risk of impaction/aspiration  Discussed in detail all the  indications, usual  risks and alternatives  relative to the benefits with patient who agrees to proceed with w/u as outlined.            Each maintenance medication was reviewed in detail including emphasizing most importantly the difference between maintenance and prns and under what circumstances the prns are to be triggered using an action plan format where appropriate.  Total time for H and P, chart review, counseling, reviewing hfa/ neb device(s) and generating customized AVS unique to this office visit / same day charting = > 30 min for copd and new cc dysphagia

## 2022-09-08 NOTE — Patient Instructions (Addendum)
No change in medications except ok to leave off the evening nebulizer if doing great   Bring your PFTs with you to next office visit and your inhaler   Please schedule a follow up visit in 6  months but call sooner if needed - hopefully with PFTs same day

## 2022-09-09 LAB — CANCER ANTIGEN 15-3: CA 15-3: 22.8 U/mL (ref 0.0–25.0)

## 2022-09-12 ENCOUNTER — Other Ambulatory Visit: Payer: Medicare Other

## 2022-09-13 ENCOUNTER — Encounter (INDEPENDENT_AMBULATORY_CARE_PROVIDER_SITE_OTHER): Payer: Self-pay | Admitting: *Deleted

## 2022-09-18 NOTE — Progress Notes (Signed)
[facility] [address] [place] [address]    Clinic Day:  09/19/2022  Referring physician: Virl Diamond, MD  Patient Care Team: Virl Diamond, MD as PCP - General (Family Medicine) Doreatha Massed, MD as Medical Oncologist (Medical Oncology)   ASSESSMENT & PLAN:   Assessment: 1.  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 -Lumpectomy and sentinel lymph node biopsy on 04/23/2019 by Dr. Dwain Sarna shows grade 1, 1.5 cm invasive lobular carcinoma, margins negative, 1 sentinel lymph node negative, ER more than 90%, PR negative, HER-2 negative, Ki-67 not done.  PT1CPN0. -Anastrozole started around 05/14/2019. -She was seen by radiation oncology in [place].  As the absolute reduction in local recurrence was low at 3% with radiation, patient opted to not to undergo XRT.   2.  Stage III (T3 N2 M0) non-small cell lung cancer, adenocarcinoma type: -She was treated with chemoradiation therapy with cisplatin VP-16 from 02/18/2008 through 04/25/2008. -CT chest on 02/25/2019 showed scarring in the right upper medial lung consistent with history of prior treatment.  3 cm left lower lobe lung cyst is unchanged.  No adenopathy. - CT chest (09/08/2020): Done at [facility] did not show any evidence of recurrence.  Bilateral patchy infiltrates suggestive of infectious process.   3.  Osteoporosis: -Started on Prolia in September 2020, receives in [place].  This was prescribed by her rheumatologist in [place]. -She will continue calcium and vitamin D supplements.    Plan: 1.  Stage I (T1CN0) invasive lobular carcinoma of the left breast: - Physical exam today: No palpable masses in bilateral breast.  Left lumpectomy scar in the upper outer quadrant around the area lies  within normal limits with no palpable adenopathy. - Last mammogram from 03/08/2022: BI-RADS Category 2. - Labs: Normal LFTs.  Mild normocytic anemia stable. - CA 15-3 was normal. - Continue anastrozole daily which she is tolerating well.  RTC 6 months for follow-up.  Will also schedule mammogram in October.   2.  Stage III (T3 N2 M0) non-small cell lung cancer, adenocarcinoma type: - Last CT of the chest was done on 09/08/2020 at [facility] did not show any evidence of recurrence. - We will order another CT of the chest without contrast at next visit.   3.  Osteoporosis: - Continue Prolia in [place] under the direction of Dr. Orson Aloe of rheumatology in [place].  Vitamin D level is normal at 64.   4.  Rheumatoid arthritis: - Continue Arava, Plaquenil and sulfasalazine.    Orders Placed This Encounter  Procedures   CT Chest Wo Contrast    Standing Status:   Future    Standing Expiration Date:   09/19/2023    Order Specific Question:   Preferred imaging location?    Answer:   [facility]    Order Specific Question:   Release to patient    Answer:   Immediate [1]   MM 3D SCREENING MAMMOGRAM BILATERAL BREAST    NAS No implants No medical devices Sch'd appt with office on 09/19/22-kjones    Standing Status:   Future    Standing Expiration Date:   09/19/2023    Order Specific Question:   Reason for Exam (SYMPTOM  OR DIAGNOSIS REQUIRED)    Answer:   screening for breast cancer    Order Specific Question:  Preferred imaging location?    Answer:   [facility]    Order Specific Question:   Release to patient    Answer:   Immediate   CBC with Differential/Platelet    Standing Status:   Future    Standing Expiration Date:   09/19/2023    Order Specific Question:   Release to patient    Answer:   Immediate   Comprehensive metabolic panel    Standing Status:   Future    Standing Expiration Date:   09/19/2023    Order Specific Question:   Release to patient     Answer:   Immediate   Magnesium    Standing Status:   Future    Standing Expiration Date:   09/19/2023    Order Specific Question:   Release to patient    Answer:   Immediate   Cancer antigen 15-3    Standing Status:   Future    Standing Expiration Date:   09/19/2023      I,Katie Daubenspeck,acting as a scribe for Doreatha Massed, MD.,have documented all relevant documentation on the behalf of Doreatha Massed, MD,as directed by  Doreatha Massed, MD while in the presence of Doreatha Massed, MD.   I, Doreatha Massed MD, have reviewed the above documentation for accuracy and completeness, and I agree with the above.   Doreatha Massed, MD   4/29/20243:48 PM  CHIEF COMPLAINT:   Diagnosis: left breast cancer    Cancer Staging  Breast cancer, left [facility]) Staging form: Breast, AJCC 8th Edition - Clinical stage from 05/14/2019: Stage IA (cT1c, cN0, cM0, G1, ER+, PR-, HER2-) - Signed by Doreatha Massed, MD on 05/14/2019    Prior Therapy: Lumpectomy and SLNB on 04/23/2019  Current Therapy:  anastrozole   HISTORY OF PRESENT ILLNESS:   Oncology History  Breast cancer, left (HCC)  04/01/2019 Initial Diagnosis   Breast cancer, left (HCC)   05/14/2019 Cancer Staging   Staging form: Breast, AJCC 8th Edition - Clinical stage from 05/14/2019: Stage IA (cT1c, cN0, cM0, G1, ER+, PR-, HER2-) - Signed by Doreatha Massed, MD on 05/14/2019      INTERVAL HISTORY:   Christy Bennett is a 67 y.o. female presenting to clinic today for follow up of left breast cancer. She was last seen by me on 03/15/22.  Today, she states that she is doing well overall. Her appetite level is at 100%. Her energy level is at 80%.  PAST MEDICAL HISTORY:   Past Medical History: Past Medical History:  Diagnosis Date   Allergy    COPD (chronic obstructive pulmonary disease) (HCC)    Depression    GERD (gastroesophageal reflux disease)    Hyperlipemia    Hypertension    Hypothyroid     Lung cancer (HCC) 2009   Osteoporosis    PVD (peripheral vascular disease) (HCC)    RA (rheumatoid arthritis) (HCC)     Surgical History: Past Surgical History:  Procedure Laterality Date   BREAST LUMPECTOMY Left 04/23/2019   BREAST LUMPECTOMY WITH RADIOACTIVE SEED AND SENTINEL LYMPH NODE BIOPSY Left 04/23/2019   Procedure: LEFT BREAST LUMPECTOMY WITH RADIOACTIVE SEED AND LEFT AXILLARY SENTINEL LYMPH NODE BIOPSY;  Surgeon: Emelia Loron, MD;  Location: [facility];  Service: General;  Laterality: Left;   CATARACT EXTRACTION, BILATERAL     FEMORAL BYPASS Right 2017   x3   KIDNEY CYST REMOVAL      Social History: Social History   Socioeconomic History   Marital status: Married  Spouse name: Tinnie Gens   Number of children: Not on file   Years of education: Not on file   Highest education level: Not on file  Occupational History   Occupation: retires  Tobacco Use   Smoking status: Former    Packs/day: 1.00    Years: 43.00    Additional pack years: 0.00    Total pack years: 43.00    Types: Cigarettes    Quit date: 04/12/2015    Years since quitting: 7.4   Smokeless tobacco: Never  Substance and Sexual Activity   Alcohol use: Not Currently   Drug use: Never   Sexual activity: Not on file  Other Topics Concern   Not on file  Social History Narrative   Lives with husband.   Social Determinants of Health   Financial Resource Strain: Low Risk  (04/01/2019)   Overall Financial Resource Strain (CARDIA)    Difficulty of Paying Living Expenses: Not hard at all  Food Insecurity: No Food Insecurity (04/01/2019)   Hunger Vital Sign    Worried About Running Out of Food in the Last Year: Never true    Ran Out of Food in the Last Year: Never true  Transportation Needs: No Transportation Needs (04/01/2019)   PRAPARE - Administrator, Civil Service (Medical): No    Lack of Transportation (Non-Medical): No  Physical Activity: Sufficiently Active  (04/01/2019)   Exercise Vital Sign    Days of Exercise per Week: 7 days    Minutes of Exercise per Session: 60 min  Stress: No Stress Concern Present (04/01/2019)   [employer] of Occupational Health - Occupational Stress Questionnaire    Feeling of Stress : Only a little  Social Connections: Moderately Integrated (04/01/2019)   Social Connection and Isolation Panel [NHANES]    Frequency of Communication with Friends and Family: Twice a week    Frequency of Social Gatherings with Friends and Family: Twice a week    Attends Religious Services: More than 4 times per year    Active Member of [employer] or Organizations: No    Attends Banker Meetings: Never    Marital Status: Married  Catering manager Violence: Not At Risk (04/01/2019)   Humiliation, Afraid, Rape, and Kick questionnaire    Fear of Current or Ex-Partner: No    Emotionally Abused: No    Physically Abused: No    Sexually Abused: No    Family History: Family History  Problem Relation Age of Onset   Diabetes Mother    Hyperlipidemia Mother     Current Medications:  Current Outpatient Medications:    albuterol (PROAIR HFA) 108 (90 Base) MCG/ACT inhaler, Inhale 2 puffs into the lungs every 4 (four) hours as needed., Disp: , Rfl:    anastrozole (ARIMIDEX) 1 MG tablet, Take 1 tablet by mouth once daily, Disp: 90 tablet, Rfl: 0   arformoterol (BROVANA) 15 MCG/2ML NEBU, Inhale into the lungs., Disp: , Rfl:    aspirin EC 81 MG tablet, Take 81 mg by mouth daily., Disp: , Rfl:    b complex vitamins capsule, Take 1 capsule by mouth daily., Disp: , Rfl:    buPROPion (WELLBUTRIN SR) 150 MG 12 hr tablet, Take 150 mg by mouth 2 (two) times daily., Disp: , Rfl:    cetirizine (ZYRTEC) 10 MG tablet, Take 10 mg by mouth daily., Disp: , Rfl:    clopidogrel (PLAVIX) 75 MG tablet, Take 75 mg by mouth daily., Disp: , Rfl:    denosumab (PROLIA)  60 MG/ML SOSY injection, Inject 60 mg into the skin every 6 (six) months., Disp: ,  Rfl:    diclofenac Sodium (VOLTAREN) 1 % GEL, Apply topically., Disp: , Rfl:    docusate sodium (COLACE) 50 MG capsule, Take 50 mg by mouth as needed for mild constipation., Disp: , Rfl:    esomeprazole (NEXIUM) 40 MG capsule, Take 1 tablet by mouth daily., Disp: , Rfl:    hydroxychloroquine (PLAQUENIL) 200 MG tablet, Take 250 mg by mouth daily. Pt 1 1/2 tablet daily, Disp: , Rfl:    leflunomide (ARAVA) 20 MG tablet, Take 1 tablet by mouth daily., Disp: , Rfl:    levothyroxine (SYNTHROID) 137 MCG tablet, Take 1 tablet by mouth daily., Disp: , Rfl:    losartan (COZAAR) 25 MG tablet, Take 25 mg by mouth daily., Disp: , Rfl:    revefenacin (YUPELRI) 175 MCG/3ML nebulizer solution, Inhale into the lungs., Disp: , Rfl:    simvastatin (ZOCOR) 10 MG tablet, Take 10 mg by mouth daily., Disp: , Rfl:    sulfaSALAzine (AZULFIDINE) 500 MG tablet, Take 500 mg by mouth 3 (three) times daily. Taking 500mg  tablet in the morning and 2 tablets in the evening, Disp: , Rfl:    traZODone (DESYREL) 100 MG tablet, Take 100 mg by mouth at bedtime., Disp: , Rfl:    verapamil (CALAN-SR) 240 MG CR tablet, Take 240 mg by mouth daily., Disp: , Rfl:    vitamin E 400 UNIT capsule, Take 400 Units by mouth daily. Pt takes 450 mg daily, Disp: , Rfl:    Allergies: Allergies  Allergen Reactions   Amoxicillin-Pot Clavulanate Nausea And Vomiting, Nausea Only and Diarrhea    Diarrhea, abdominal pain    REVIEW OF SYSTEMS:   Review of Systems  Constitutional:  Negative for chills, fatigue and fever.  HENT:   Negative for lump/mass, mouth sores, nosebleeds, sore throat and trouble swallowing.   Eyes:  Negative for eye problems.  Respiratory:  Positive for cough. Negative for shortness of breath.   Cardiovascular:  Negative for chest pain, leg swelling and palpitations.  Gastrointestinal:  Negative for abdominal pain, constipation, diarrhea, nausea and vomiting.  Genitourinary:  Negative for bladder incontinence, difficulty  urinating, dysuria, frequency, hematuria and nocturia.   Musculoskeletal:  Negative for arthralgias, back pain, flank pain, myalgias and neck pain.  Skin:  Negative for itching and rash.  Neurological:  Negative for dizziness, headaches and numbness.  Hematological:  Does not bruise/bleed easily.  Psychiatric/Behavioral:  Negative for depression, sleep disturbance and suicidal ideas. The patient is not nervous/anxious.   All other systems reviewed and are negative.    VITALS:   Blood pressure (!) 172/66, pulse 77, temperature 98.2 F (36.8 C), temperature source Tympanic, resp. rate 18, height 5\' 2"  (1.575 m), weight 120 lb 3.2 oz (54.5 kg), SpO2 97 %.  Wt Readings from Last 3 Encounters:  09/19/22 120 lb 3.2 oz (54.5 kg)  09/08/22 121 lb 6.4 oz (55.1 kg)  07/28/22 118 lb 6.4 oz (53.7 kg)    Body mass index is 21.98 kg/m.  Performance status (ECOG): 1 - Symptomatic but completely ambulatory  PHYSICAL EXAM:   Physical Exam Vitals and nursing note reviewed. Exam conducted with a chaperone present.  Constitutional:      Appearance: Normal appearance.  Cardiovascular:     Rate and Rhythm: Normal rate and regular rhythm.     Pulses: Normal pulses.     Heart sounds: Normal heart sounds.  Pulmonary:  Effort: Pulmonary effort is normal.     Breath sounds: Normal breath sounds.  Abdominal:     Palpations: Abdomen is soft. There is no hepatomegaly, splenomegaly or mass.     Tenderness: There is no abdominal tenderness.  Musculoskeletal:     Right lower leg: No edema.     Left lower leg: Edema present.  Lymphadenopathy:     Cervical: No cervical adenopathy.     Right cervical: No superficial, deep or posterior cervical adenopathy.    Left cervical: No superficial, deep or posterior cervical adenopathy.     Upper Body:     Right upper body: No supraclavicular or axillary adenopathy.     Left upper body: No supraclavicular or axillary adenopathy.  Neurological:     General: No  focal deficit present.     Mental Status: She is alert and oriented to person, place, and time.  Psychiatric:        Mood and Affect: Mood normal.        Behavior: Behavior normal.     LABS:      Latest Ref Rng & Units 09/08/2022    8:17 AM 03/08/2022   11:15 AM 09/09/2021    1:38 PM  CBC  WBC 4.0 - 10.5 K/uL 4.6  5.3  5.6   Hemoglobin 12.0 - 15.0 g/dL 56.2  13.0  86.5   Hematocrit 36.0 - 46.0 % 34.1  35.7  35.0   Platelets 150 - 400 K/uL 220  249  241       Latest Ref Rng & Units 09/08/2022    8:17 AM 03/08/2022   11:15 AM 09/09/2021    1:38 PM  CMP  Glucose 70 - 99 mg/dL 85  784  87   BUN 8 - 23 mg/dL 24  16  16    Creatinine 0.44 - 1.00 mg/dL 6.96  2.95  2.84   Sodium 135 - 145 mmol/L 137  141  140   Potassium 3.5 - 5.1 mmol/L 3.8  3.8  4.0   Chloride 98 - 111 mmol/L 102  107  106   CO2 22 - 32 mmol/L 26  27  29    Calcium 8.9 - 10.3 mg/dL 9.3  9.5  9.4   Total Protein 6.5 - 8.1 g/dL 4.8  5.2  6.0   Total Bilirubin 0.3 - 1.2 mg/dL 0.7  0.7  0.7   Alkaline Phos 38 - 126 U/L 67  80  76   AST 15 - 41 U/L 25  28  26    ALT 0 - 44 U/L 25  49  30      No results found for: "CEA1", "CEA" / No results found for: "CEA1", "CEA" No results found for: "PSA1" No results found for: "CAN199" Lab Results  Component Value Date   CAN125 39.0 (H) 09/16/2020    No results found for: "TOTALPROTELP", "ALBUMINELP", "A1GS", "A2GS", "BETS", "BETA2SER", "GAMS", "MSPIKE", "SPEI" No results found for: "TIBC", "FERRITIN", "IRONPCTSAT" No results found for: "LDH"   STUDIES:   No results found.

## 2022-09-19 ENCOUNTER — Inpatient Hospital Stay (HOSPITAL_BASED_OUTPATIENT_CLINIC_OR_DEPARTMENT_OTHER): Payer: Medicare Other | Admitting: Hematology

## 2022-09-19 VITALS — BP 172/66 | HR 77 | Temp 98.2°F | Resp 18 | Ht 62.0 in | Wt 120.2 lb

## 2022-09-19 DIAGNOSIS — Z853 Personal history of malignant neoplasm of breast: Secondary | ICD-10-CM | POA: Diagnosis not present

## 2022-09-19 DIAGNOSIS — Z85118 Personal history of other malignant neoplasm of bronchus and lung: Secondary | ICD-10-CM | POA: Diagnosis not present

## 2022-09-19 DIAGNOSIS — Z1231 Encounter for screening mammogram for malignant neoplasm of breast: Secondary | ICD-10-CM | POA: Diagnosis not present

## 2022-09-19 DIAGNOSIS — Z170 Estrogen receptor positive status [ER+]: Secondary | ICD-10-CM | POA: Diagnosis not present

## 2022-09-19 DIAGNOSIS — C50412 Malignant neoplasm of upper-outer quadrant of left female breast: Secondary | ICD-10-CM

## 2022-09-19 NOTE — Patient Instructions (Signed)
[facility]  Discharge Instructions  You were seen and examined today by Dr. Ellin Saba.  Dr. Ellin Saba discussed your most recent lab work which revealed that everything looks good.  Dr. Ellin Saba is ordering a repeat mammogram and CT of your Chest.  You can use black cohosh to help with your hot flashes.  Follow-up as scheduled in 6 months.    Thank you for choosing [facility] to provide your oncology and hematology care.   To afford each patient quality time with our provider, please arrive at least 15 minutes before your scheduled appointment time. You may need to reschedule your appointment if you arrive late (10 or more minutes). Arriving late affects you and other patients whose appointments are after yours.  Also, if you miss three or more appointments without notifying the office, you may be dismissed from the clinic at the provider's discretion.    Again, thank you for choosing [facility].  Our hope is that these requests will decrease the amount of time that you wait before being seen by our physicians.   If you have a lab appointment with the Cancer Center - please note that after April 8th, all labs will be drawn in the cancer center.  You do not have to check in or register with the main entrance as you have in the past but will complete your check-in at the cancer center.            _____________________________________________________________  Should you have questions after your visit to [facility], please contact our office at [phone] and follow the prompts.  Our office hours are 8:00 a.m. to 4:30 p.m. Monday - Thursday and 8:00 a.m. to 2:30 p.m. Friday.  Please note that voicemails left after 4:00 p.m. may not be returned until the following business day.  We are closed weekends and all major holidays.  You do have access to a nurse 24-7, just call the main number to the clinic  [phone] and do not press any options, hold on the line and a nurse will answer the phone.    For prescription refill requests, have your pharmacy contact our office and allow 72 hours.    Masks are no longer required in the cancer centers. If you would like for your care team to wear a mask while they are taking care of you, please let them know. You may have one support person who is at least 69 years old accompany you for your appointments.

## 2022-09-23 ENCOUNTER — Other Ambulatory Visit: Payer: Self-pay | Admitting: Hematology

## 2022-09-23 DIAGNOSIS — Z170 Estrogen receptor positive status [ER+]: Secondary | ICD-10-CM

## 2022-10-03 ENCOUNTER — Encounter: Payer: Self-pay | Admitting: Internal Medicine

## 2022-10-03 ENCOUNTER — Ambulatory Visit (INDEPENDENT_AMBULATORY_CARE_PROVIDER_SITE_OTHER): Payer: Medicare Other | Admitting: Internal Medicine

## 2022-10-03 VITALS — BP 160/83 | HR 72 | Ht 62.0 in | Wt 118.4 lb

## 2022-10-03 DIAGNOSIS — G4700 Insomnia, unspecified: Secondary | ICD-10-CM | POA: Insufficient documentation

## 2022-10-03 DIAGNOSIS — J449 Chronic obstructive pulmonary disease, unspecified: Secondary | ICD-10-CM | POA: Diagnosis not present

## 2022-10-03 DIAGNOSIS — C50412 Malignant neoplasm of upper-outer quadrant of left female breast: Secondary | ICD-10-CM | POA: Diagnosis not present

## 2022-10-03 DIAGNOSIS — Z170 Estrogen receptor positive status [ER+]: Secondary | ICD-10-CM

## 2022-10-03 DIAGNOSIS — R1314 Dysphagia, pharyngoesophageal phase: Secondary | ICD-10-CM

## 2022-10-03 DIAGNOSIS — C3491 Malignant neoplasm of unspecified part of right bronchus or lung: Secondary | ICD-10-CM | POA: Insufficient documentation

## 2022-10-03 DIAGNOSIS — K219 Gastro-esophageal reflux disease without esophagitis: Secondary | ICD-10-CM

## 2022-10-03 DIAGNOSIS — I10 Essential (primary) hypertension: Secondary | ICD-10-CM

## 2022-10-03 DIAGNOSIS — F411 Generalized anxiety disorder: Secondary | ICD-10-CM | POA: Insufficient documentation

## 2022-10-03 DIAGNOSIS — M810 Age-related osteoporosis without current pathological fracture: Secondary | ICD-10-CM

## 2022-10-03 DIAGNOSIS — E039 Hypothyroidism, unspecified: Secondary | ICD-10-CM

## 2022-10-03 DIAGNOSIS — I739 Peripheral vascular disease, unspecified: Secondary | ICD-10-CM

## 2022-10-03 DIAGNOSIS — E785 Hyperlipidemia, unspecified: Secondary | ICD-10-CM

## 2022-10-03 DIAGNOSIS — M069 Rheumatoid arthritis, unspecified: Secondary | ICD-10-CM

## 2022-10-03 NOTE — Assessment & Plan Note (Signed)
Asymptomatic currently.  Followed by pulmonology (Dr. Sherene Sires).  She is prescribed Brovana and Yupelri nebulizer solutions for daily use.  Recently seen by pulmonology for follow-up. -No medication changes today.

## 2022-10-03 NOTE — Assessment & Plan Note (Signed)
She endorses a history of PAD s/p femoropopliteal bypass x 4 in the right leg.  Followed by vascular surgery (Dr. Darcey Nora) and is prescribed ASA/Plavix and simvastatin. -No medication changes today

## 2022-10-03 NOTE — Assessment & Plan Note (Signed)
She is currently prescribed losartan 25 mg daily and verapamil 240 mg daily for treatment of hypertension.  BP is elevated today, but she attributes this to anxiety related to establish care with a new provider.  She regularly checks her blood pressure at home and readings are well-controlled. -No medication changes today -Continue to monitor BP at follow-up and make necessary adjustments if it remains elevated.

## 2022-10-03 NOTE — Assessment & Plan Note (Signed)
She is prescribed trazodone 100 mg nightly. -No medication changes today

## 2022-10-03 NOTE — Patient Instructions (Signed)
It was a pleasure to see you today.  Thank you for giving [place] the opportunity to be involved in your care.  Below is a brief recap of your visit and next steps.  We will plan to see you again in 3 months.  Summary You have established care today We will get records from your previous provider and plan for follow up in 3 months for your annual exam.

## 2022-10-03 NOTE — Assessment & Plan Note (Addendum)
Etiology unspecified.  She is currently prescribed Synthroid 137 mcg daily.  Asymptomatic currently -No medication changes today -Repeat TFTs at follow-up in 3 months

## 2022-10-03 NOTE — Assessment & Plan Note (Signed)
Currently prescribed Prolia for treatment of osteoporosis.  Vitamin D level recently checked and is adequate.

## 2022-10-03 NOTE — Assessment & Plan Note (Signed)
Recent history of pharyngoesophageal dysphagia.  She reports undergoing EGD in [place] 2 years ago with esophageal dilation.  She will establish care with gastroenterology in [place] tomorrow (5/14).

## 2022-10-03 NOTE — Assessment & Plan Note (Signed)
Followed by rheumatology at the [facility] in [place] (Dr. Orson Aloe).  She is prescribed Plaquenil and sulfasalazine. -No medication changes today

## 2022-10-03 NOTE — Progress Notes (Signed)
New Patient Office Visit  Subjective    Patient ID: Christy Bennett, female    DOB: [birth year]  Age: 67 y.o. MRN: [id number]  CC:  Chief Complaint  Patient presents with   Establish Care   HPI Christy Bennett presents to establish care.  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  She has most recently been followed by Dr. Reynaldo Minium at the [facility] in [place], [place].  Christy Bennett reports feeling well today.  She is asymptomatic and has no acute concerns to discuss aside from desiring to establish care.  She endorses former tobacco use x 38 years prior to quitting in 2016.  She denies alcohol and illicit drug use.  Chronic medical conditions and outstanding preventative care items discussed today are individually addressed in A/P below.  Outpatient Encounter Medications as of 10/03/2022  Medication Sig   albuterol (PROAIR HFA) 108 (90 Base) MCG/ACT inhaler Inhale 2 puffs into the lungs every 4 (four) hours as needed.   anastrozole (ARIMIDEX) 1 MG tablet Take 1 tablet by mouth once daily   arformoterol (BROVANA) 15 MCG/2ML NEBU Inhale into the lungs.   aspirin EC 81 MG tablet Take 81 mg by mouth daily.   b complex vitamins capsule Take 1 capsule by mouth daily.   buPROPion (WELLBUTRIN SR) 150 MG 12 hr tablet Take 150 mg by mouth 2 (two) times daily.   cetirizine (ZYRTEC) 10 MG tablet Take 10 mg by mouth daily.   clopidogrel (PLAVIX) 75 MG tablet Take 75 mg by mouth daily.   denosumab (PROLIA) 60 MG/ML SOSY injection Inject 60 mg into the skin every 6 (six) months.   diclofenac  Sodium (VOLTAREN) 1 % GEL Apply topically.   docusate sodium (COLACE) 50 MG capsule Take 50 mg by mouth as needed for mild constipation.   esomeprazole (NEXIUM) 40 MG capsule Take 1 tablet by mouth daily.   hydroxychloroquine (PLAQUENIL) 200 MG tablet Take 250 mg by mouth daily. Pt 1 1/2 tablet daily   leflunomide (ARAVA) 20 MG tablet Take 1 tablet by mouth daily.   levothyroxine (SYNTHROID) 137 MCG tablet Take 1 tablet by mouth daily.   losartan (COZAAR) 25 MG tablet Take 25 mg by mouth daily.   revefenacin (YUPELRI) 175 MCG/3ML nebulizer solution Inhale into the lungs.   simvastatin (ZOCOR) 10 MG tablet Take 10 mg by mouth daily.   sulfaSALAzine (AZULFIDINE) 500 MG tablet Take 500 mg by mouth 3 (three) times daily. Taking 500mg  tablet in the morning and 2 tablets in the evening   traZODone (DESYREL) 100 MG tablet Take 100 mg by mouth at bedtime.   verapamil (CALAN-SR) 240 MG CR tablet Take 240 mg by mouth daily.   vitamin E 400 UNIT capsule Take 400 Units by mouth daily. Pt takes 450 mg daily   No facility-administered encounter medications on file as of 10/03/2022.    Past Medical History:  Diagnosis Date   Allergy    COPD (chronic obstructive pulmonary disease) (HCC)    Depression    GERD (gastroesophageal reflux disease)    Hyperlipemia    Hypertension    Hypothyroid    Lung cancer (HCC)  2009   Osteoporosis    PVD (peripheral vascular disease) (HCC)    RA (rheumatoid arthritis) (HCC)     Past Surgical History:  Procedure Laterality Date   BREAST LUMPECTOMY Left 04/23/2019   BREAST LUMPECTOMY WITH RADIOACTIVE SEED AND SENTINEL LYMPH NODE BIOPSY Left 04/23/2019   Procedure: LEFT BREAST LUMPECTOMY WITH RADIOACTIVE SEED AND LEFT AXILLARY SENTINEL LYMPH NODE BIOPSY;  Surgeon: Emelia Loron, MD;  Location: [facility];  Service: General;  Laterality: Left;   CATARACT EXTRACTION, BILATERAL     FEMORAL BYPASS Right 2017   x3   KIDNEY CYST REMOVAL      Family  History  Problem Relation Age of Onset   Diabetes Mother    Hyperlipidemia Mother     Social History   Socioeconomic History   Marital status: Married    Spouse name: Tinnie Gens   Number of children: Not on file   Years of education: Not on file   Highest education level: Not on file  Occupational History   Occupation: retires  Tobacco Use   Smoking status: Former    Packs/day: 1.00    Years: 43.00    Additional pack years: 0.00    Total pack years: 43.00    Types: Cigarettes    Quit date: 04/12/2015    Years since quitting: 7.4   Smokeless tobacco: Never  Substance and Sexual Activity   Alcohol use: Not Currently   Drug use: Never   Sexual activity: Not on file  Other Topics Concern   Not on file  Social History Narrative   Lives with husband.   Social Determinants of Health   Financial Resource Strain: Low Risk  (04/01/2019)   Overall Financial Resource Strain (CARDIA)    Difficulty of Paying Living Expenses: Not hard at all  Food Insecurity: No Food Insecurity (04/01/2019)   Hunger Vital Sign    Worried About Running Out of Food in the Last Year: Never true    Ran Out of Food in the Last Year: Never true  Transportation Needs: No Transportation Needs (04/01/2019)   PRAPARE - Administrator, Civil Service (Medical): No    Lack of Transportation (Non-Medical): No  Physical Activity: Sufficiently Active (04/01/2019)   Exercise Vital Sign    Days of Exercise per Week: 7 days    Minutes of Exercise per Session: 60 min  Stress: No Stress Concern Present (04/01/2019)   [employer] of Occupational Health - Occupational Stress Questionnaire    Feeling of Stress : Only a little  Social Connections: Moderately Integrated (04/01/2019)   Social Connection and Isolation Panel [NHANES]    Frequency of Communication with Friends and Family: Twice a week    Frequency of Social Gatherings with Friends and Family: Twice a week    Attends Religious Services: More  than 4 times per year    Active Member of [employer] or Organizations: No    Attends Banker Meetings: Never    Marital Status: Married  Catering manager Violence: Not At Risk (04/01/2019)   Humiliation, Afraid, Rape, and Kick questionnaire    Fear of Current or Ex-Partner: No    Emotionally Abused: No    Physically Abused: No    Sexually Abused: No    Review of Systems  Constitutional:  Negative for chills and fever.  HENT:  Negative for sore throat.   Respiratory:  Negative for cough and shortness of breath.   Cardiovascular:  Negative for chest pain, palpitations and  leg swelling.  Gastrointestinal:  Negative for abdominal pain, blood in stool, constipation, diarrhea, nausea and vomiting.  Genitourinary:  Negative for dysuria and hematuria.  Musculoskeletal:  Negative for myalgias.  Skin:  Negative for itching and rash.  Neurological:  Negative for dizziness and headaches.  Psychiatric/Behavioral:  Negative for depression and suicidal ideas.         Objective    BP (!) 160/83   Pulse 72   Ht 5\' 2"  (1.575 m)   Wt 118 lb 6.4 oz (53.7 kg)   SpO2 93%   BMI 21.66 kg/m   Physical Exam Vitals reviewed.  Constitutional:      General: She is not in acute distress.    Appearance: Normal appearance. She is not toxic-appearing.  HENT:     Head: Normocephalic and atraumatic.     Right Ear: External ear normal.     Left Ear: External ear normal.     Nose: Nose normal. No congestion or rhinorrhea.     Mouth/Throat:     Mouth: Mucous membranes are moist.     Pharynx: Oropharynx is clear. No oropharyngeal exudate or posterior oropharyngeal erythema.  Eyes:     General: No scleral icterus.    Extraocular Movements: Extraocular movements intact.     Conjunctiva/sclera: Conjunctivae normal.     Pupils: Pupils are equal, round, and reactive to light.  Cardiovascular:     Rate and Rhythm: Normal rate and regular rhythm.     Pulses: Normal pulses.     Heart sounds:  Murmur heard.     No friction rub. No gallop.  Pulmonary:     Effort: Pulmonary effort is normal.     Breath sounds: Normal breath sounds. No wheezing, rhonchi or rales.  Abdominal:     General: Abdomen is flat. Bowel sounds are normal. There is no distension.     Palpations: Abdomen is soft.     Tenderness: There is no abdominal tenderness.  Musculoskeletal:        General: Deformity (Bunion of left foot with significant lateral deviation of first digit of left foot) present. No swelling. Normal range of motion.     Cervical back: Normal range of motion.     Right lower leg: No edema.     Left lower leg: No edema.  Lymphadenopathy:     Cervical: No cervical adenopathy.  Skin:    General: Skin is warm and dry.     Capillary Refill: Capillary refill takes less than 2 seconds.     Coloration: Skin is not jaundiced.  Neurological:     General: No focal deficit present.     Mental Status: She is alert and oriented to person, place, and time.  Psychiatric:        Mood and Affect: Mood normal.        Behavior: Behavior normal.    Assessment & Plan:   Problem List Items Addressed This Visit       PAD (peripheral artery disease) (HCC)    She endorses a history of PAD s/p femoropopliteal bypass x 4 in the right leg.  Followed by vascular surgery (Dr. Darcey Nora) and is prescribed ASA/Plavix and simvastatin. -No medication changes today      Essential hypertension    She is currently prescribed losartan 25 mg daily and verapamil 240 mg daily for treatment of hypertension.  BP is elevated today, but she attributes this to anxiety related to establish care with a new provider.  She regularly checks  her blood pressure at home and readings are well-controlled. -No medication changes today -Continue to monitor BP at follow-up and make necessary adjustments if it remains elevated.      COPD  GOLD ? - Primary    Asymptomatic currently.  Followed by pulmonology (Dr. Sherene Sires).  She is prescribed  Brovana and Yupelri nebulizer solutions for daily use.  Recently seen by pulmonology for follow-up. -No medication changes today.      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  S/p chemoradiation.  Followed by oncology (Dr. Ellin Saba).  Recently seen for follow-up. -Reports that she will undergo repeat CT in October.      Dysphagia    Recent history of pharyngoesophageal dysphagia.  She reports undergoing EGD in [place] 2 years ago with esophageal dilation.  She will establish care with gastroenterology in [place] tomorrow (5/14).      GERD (gastroesophageal reflux disease)    Symptoms are currently well-controlled with Nexium. -No medication changes today -Gastroenterology appointment tomorrow (5/14).      Hypothyroidism    Etiology unspecified.  She is currently prescribed Synthroid 137 mcg daily.  Asymptomatic currently -No medication changes today -Repeat TFTs at follow-up in 3 months      Rheumatoid arthritis (HCC)    Followed by rheumatology at the [facility] in [place] (Dr. Orson Aloe).  She is prescribed Plaquenil and sulfasalazine. -No medication changes today      Osteoporosis    Currently prescribed Prolia for treatment of osteoporosis.  Vitamin D level recently checked and is adequate.      Breast cancer, left (HCC)    History of invasive lobular carcinoma of the left breast s/p lumpectomy and SNLB in December 2020.  Followed by oncology (Dr. Ellin Saba).  She is currently on anastrozole.  Recently seen by oncology for follow-up. -No medication changes today      Hyperlipidemia    Currently prescribed simvastatin 10 mg daily -Repeat lipid panel at follow-up in 3 months      Insomnia    She is prescribed trazodone 100 mg nightly. -No medication changes today      Generalized anxiety disorder    She is prescribed Wellbutrin 150 mg twice daily.  Anxiety is currently well-controlled. -No medication  changes today       Return in about 3 months (around 01/03/2023) for CPE.   Billie Lade, MD

## 2022-10-03 NOTE — Assessment & Plan Note (Signed)
Currently prescribed simvastatin 10 mg daily -Repeat lipid panel at follow-up in 3 months

## 2022-10-03 NOTE — Assessment & Plan Note (Signed)
Symptoms are currently well-controlled with Nexium. -No medication changes today -Gastroenterology appointment tomorrow (5/14).

## 2022-10-03 NOTE — Assessment & Plan Note (Signed)
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  S/p chemoradiation.  Followed by oncology (Dr. Ellin Saba).  Recently seen for follow-up. -Reports that she will undergo repeat CT in October.

## 2022-10-03 NOTE — Assessment & Plan Note (Signed)
She is prescribed Wellbutrin 150 mg twice daily.  Anxiety is currently well-controlled. -No medication changes today

## 2022-10-03 NOTE — Assessment & Plan Note (Addendum)
History of invasive lobular carcinoma of the left breast s/p lumpectomy and SNLB in December 2020.  Followed by oncology (Dr. Ellin Saba).  She is currently on anastrozole.  Recently seen by oncology for follow-up. -No medication changes today

## 2022-10-04 ENCOUNTER — Ambulatory Visit (INDEPENDENT_AMBULATORY_CARE_PROVIDER_SITE_OTHER): Payer: Medicare Other | Admitting: Gastroenterology

## 2022-10-04 ENCOUNTER — Encounter (INDEPENDENT_AMBULATORY_CARE_PROVIDER_SITE_OTHER): Payer: Self-pay | Admitting: Gastroenterology

## 2022-10-04 VITALS — BP 153/98 | HR 78 | Temp 97.3°F | Ht 62.0 in | Wt 120.0 lb

## 2022-10-04 DIAGNOSIS — R1310 Dysphagia, unspecified: Secondary | ICD-10-CM | POA: Diagnosis not present

## 2022-10-04 NOTE — Progress Notes (Signed)
Referring Provider: Nyoka Cowden, MD Primary Care Physician:  Billie Lade, MD Primary GI Physician: new   Chief Complaint  Patient presents with   Dysphagia    Having some trouble swallowing. Reports a couple years ago she had esophagus stretched in [place]. It helped for awhile and now starting to get choked on meats and breads. Last colonoscopy was 2 years ago per patient in [place].    HPI:   Anmol Decook is a 67 y.o. 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   Patient presenting today as a new patient for dysphagia.   She notes that she had EGD with dilation for dysphagia in June 2022 but was never seen back for follow up after that, previously seen at [facility] [place]  In the past few months she has had return of dysphagia. She has more issues with meats and breads. She notes she has issues almost daily, sometimes having to induce vomiting to get the food back up. Denies issues with liquids or pills. GERD is well controlled on nexium 40mg  daily. She has no abdominal pain, nausea, or vomiting. No rectal bleeding, melena, weight loss or changes in appetite.   NSAID use: takes ibuprofen on occasion for her OA, none recently  Social hx: no etoh or tobacco, previous smoker, stopped in 2016  Fam hx: no CRC or liver disease   Last Colonoscopy: 10/2020 years ago per patient-[facility] in [place], no polyps  Last Endoscopy: 10/2020 years ago, [facility] in [place]-fundic gland polyp, no h pylori   Recommendations:    Past Medical History:  Diagnosis Date   Allergy    COPD (chronic obstructive pulmonary  disease) (HCC)    Depression    GERD (gastroesophageal reflux disease)    Hyperlipemia    Hypertension    Hypothyroid    Lung cancer (HCC) 2009   Osteoporosis    PVD (peripheral vascular disease) (HCC)    RA (rheumatoid arthritis) (HCC)     Past Surgical History:  Procedure Laterality Date   BREAST LUMPECTOMY Left 04/23/2019   BREAST LUMPECTOMY WITH RADIOACTIVE SEED AND SENTINEL LYMPH NODE BIOPSY Left 04/23/2019   Procedure: LEFT BREAST LUMPECTOMY WITH RADIOACTIVE SEED AND LEFT AXILLARY SENTINEL LYMPH NODE BIOPSY;  Surgeon: Emelia Loron, MD;  Location: [facility];  Service: General;  Laterality: Left;   CATARACT EXTRACTION, BILATERAL     FEMORAL BYPASS Right 2017   x3   KIDNEY STONE SURGERY      Current Outpatient Medications  Medication Sig Dispense Refill   albuterol (PROAIR HFA) 108 (90 Base) MCG/ACT inhaler Inhale 2 puffs into the lungs every 4 (four) hours as needed.     anastrozole (ARIMIDEX) 1 MG tablet Take 1 tablet by mouth once daily 90 tablet 1   arformoterol (BROVANA) 15 MCG/2ML NEBU Inhale into the lungs.     aspirin EC 81 MG tablet Take 81 mg by mouth daily.     b complex vitamins capsule Take 1 capsule by mouth daily.     buPROPion (WELLBUTRIN SR) 150 MG 12 hr tablet Take 150 mg by mouth 2 (two) times daily.     cetirizine (ZYRTEC)  10 MG tablet Take 10 mg by mouth daily.     clopidogrel (PLAVIX) 75 MG tablet Take 75 mg by mouth daily.     denosumab (PROLIA) 60 MG/ML SOSY injection Inject 60 mg into the skin every 6 (six) months.     diclofenac Sodium (VOLTAREN) 1 % GEL Apply topically.     docusate sodium (COLACE) 50 MG capsule Take 50 mg by mouth as needed for mild constipation.     esomeprazole (NEXIUM) 40 MG capsule Take 1 tablet by mouth daily.     hydroxychloroquine (PLAQUENIL) 200 MG tablet Take 250 mg by mouth daily. Pt 1 1/2 tablet daily     leflunomide (ARAVA) 20 MG tablet Take 1 tablet by mouth daily.     levothyroxine (SYNTHROID)  137 MCG tablet Take 1 tablet by mouth daily.     losartan (COZAAR) 25 MG tablet Take 25 mg by mouth daily.     revefenacin (YUPELRI) 175 MCG/3ML nebulizer solution Inhale into the lungs.     simvastatin (ZOCOR) 10 MG tablet Take 10 mg by mouth daily.     sulfaSALAzine (AZULFIDINE) 500 MG tablet Take 500 mg by mouth 3 (three) times daily. Taking 500mg  tablet in the morning and 2 tablets in the evening     traZODone (DESYREL) 100 MG tablet Take 100 mg by mouth at bedtime.     verapamil (CALAN-SR) 240 MG CR tablet Take 240 mg by mouth daily.     vitamin E 400 UNIT capsule Take 400 Units by mouth daily. Pt takes 450 mg daily     No current facility-administered medications for this visit.    Allergies as of 10/04/2022 - Review Complete 10/04/2022  Allergen Reaction Noted   Amoxicillin-pot clavulanate Nausea And Vomiting, Nausea Only, and Diarrhea 09/02/2016    Family History  Problem Relation Age of Onset   Diabetes Mother    Hyperlipidemia Mother    Lung cancer Maternal Aunt     Social History   Socioeconomic History   Marital status: Married    Spouse name: Tinnie Gens   Number of children: Not on file   Years of education: Not on file   Highest education level: Not on file  Occupational History   Occupation: retires  Tobacco Use   Smoking status: Former    Packs/day: 1.00    Years: 43.00    Additional pack years: 0.00    Total pack years: 43.00    Types: Cigarettes    Quit date: 04/12/2015    Years since quitting: 7.4    Passive exposure: Past   Smokeless tobacco: Never  Substance and Sexual Activity   Alcohol use: Not Currently   Drug use: Never   Sexual activity: Not on file  Other Topics Concern   Not on file  Social History Narrative   Lives with husband.   Social Determinants of Health   Financial Resource Strain: Low Risk  (04/01/2019)   Overall Financial Resource Strain (CARDIA)    Difficulty of Paying Living Expenses: Not hard at all  Food Insecurity: No  Food Insecurity (04/01/2019)   Hunger Vital Sign    Worried About Running Out of Food in the Last Year: Never true    Ran Out of Food in the Last Year: Never true  Transportation Needs: No Transportation Needs (04/01/2019)   PRAPARE - Administrator, Civil Service (Medical): No    Lack of Transportation (Non-Medical): No  Physical Activity: Sufficiently Active (04/01/2019)   Exercise Vital  Sign    Days of Exercise per Week: 7 days    Minutes of Exercise per Session: 60 min  Stress: No Stress Concern Present (04/01/2019)   [employer] of Occupational Health - Occupational Stress Questionnaire    Feeling of Stress : Only a little  Social Connections: Moderately Integrated (04/01/2019)   Social Connection and Isolation Panel [NHANES]    Frequency of Communication with Friends and Family: Twice a week    Frequency of Social Gatherings with Friends and Family: Twice a week    Attends Religious Services: More than 4 times per year    Active Member of [employer] or Organizations: No    Attends Engineer, structural: Never    Marital Status: Married    Review of systems General: negative for malaise, night sweats, fever, chills, weight loss Neck: Negative for lumps, goiter, pain and significant neck swelling Resp: Negative for cough, wheezing, dyspnea at rest CV: Negative for chest pain, leg swelling, palpitations, orthopnea GI: denies melena, hematochezia, nausea, vomiting, diarrhea, constipation, odyonophagia, early satiety or unintentional weight loss. +dysphagia MSK: Negative for joint pain or swelling, back pain, and muscle pain. Derm: Negative for itching or rash Psych: Denies depression, anxiety, memory loss, confusion. No homicidal or suicidal ideation.  Heme: Negative for prolonged bleeding, bruising easily, and swollen nodes. Endocrine: Negative for cold or heat intolerance, polyuria, polydipsia and goiter. Neuro: negative for tremor, gait imbalance, syncope and  seizures. The remainder of the review of systems is noncontributory.  Physical Exam: BP (!) 143/81   Pulse 78   Temp (!) 97.3 F (36.3 C) (Oral)   Ht 5\' 2"  (1.575 m)   Wt 120 lb (54.4 kg)   BMI 21.95 kg/m  General:   Alert and oriented. No distress noted. Pleasant and cooperative.  Head:  Normocephalic and atraumatic. Eyes:  Conjuctiva clear without scleral icterus. Mouth:  Oral mucosa pink and moist. Good dentition. No lesions. Heart: Normal rate and rhythm, s1 and s2 heart sounds present.  Lungs: Clear lung sounds in all lobes. Respirations equal and unlabored. Abdomen:  +BS, soft, non-tender and non-distended. No rebound or guarding. No HSM or masses noted. Derm: No palmar erythema or jaundice Msk:  Symmetrical without gross deformities. Normal posture. Extremities:  Without edema. Neurologic:  Alert and  oriented x4 Psych:  Alert and cooperative. Normal mood and affect.  Invalid input(s): "6 MONTHS"   ASSESSMENT: Irena Shaddix is a 67 y.o. female presenting today as a new patient for dysphagia  Dyshpagia for the past few months, worse with breads and meats, noting symptoms almost daily. Had EGD + dilation at [facility] in June 2022 with resolution of her dysphagia until recently. Denies odynophagia, no early satiety or weight loss. GERD well controlled on nexium 40mg  daily. Recommend proceeding with EGD +/- dilation as I cannot rule out esophageal ring, web, stricture, stenosis, esophagitis. Indications, risks and benefits of procedure discussed in detail with patient. Patient verbalized understanding and is in agreement to proceed with EGD +/- dilation.    PLAN:  Continue nexium 40mg  daily  2.  Schedule EGD +/- dilation  ASA III ( hold palvix x5 days)  3.  Chewing precautions  All questions were answered, patient verbalized understanding and is in agreement with plan as outlined above.   Follow Up: 3 months   Keanu Frickey L. Jeanmarie Hubert, MSN, APRN, AGNP-C Adult-Gerontology  Nurse Practitioner [facility]  I have reviewed the note and agree with the APP's assessment as described in  this progress note  Maylon Peppers, MD Gastroenterology and Hepatology [facility]

## 2022-10-04 NOTE — Patient Instructions (Signed)
Please continue nexium 40mg  daily We will get you scheduled for upper endoscopy Avoid thicker, drier foods, take small bites, chew thoroughly and take sips of liquids between bites  Follow up 3 months  It was a pleasure to see you today. I want to create trusting relationships with patients and provide genuine, compassionate, and quality care. I truly value your feedback! please be on the lookout for a survey regarding your visit with me today. I appreciate your input about our visit and your time in completing this!    Jerni Selmer L. Jeanmarie Hubert, MSN, APRN, AGNP-C Adult-Gerontology Nurse Practitioner [facility] at [facility]

## 2022-10-11 ENCOUNTER — Encounter: Payer: Medicare Other | Admitting: Internal Medicine

## 2022-10-11 ENCOUNTER — Encounter (INDEPENDENT_AMBULATORY_CARE_PROVIDER_SITE_OTHER): Payer: Self-pay

## 2022-10-13 ENCOUNTER — Telehealth: Payer: Self-pay | Admitting: Internal Medicine

## 2022-10-13 NOTE — Telephone Encounter (Signed)
Pt LVM returning call

## 2022-10-13 NOTE — Telephone Encounter (Signed)
Returned pt call  

## 2022-10-20 ENCOUNTER — Ambulatory Visit (INDEPENDENT_AMBULATORY_CARE_PROVIDER_SITE_OTHER): Payer: Medicare Other

## 2022-10-20 DIAGNOSIS — Z0000 Encounter for general adult medical examination without abnormal findings: Secondary | ICD-10-CM

## 2022-10-20 NOTE — Progress Notes (Signed)
Subjective:   Christy Bennett is a 67 y.o. female who presents for Medicare Annual (Subsequent) preventive examination.  Review of Systems    I connected with  Sherlyn Hay on 10/20/22 by a audio enabled telemedicine application and verified that I am speaking with the correct person using two identifiers.  Patient Location: Home  Provider Location: Office/Clinic  I discussed the limitations of evaluation and management by telemedicine. The patient expressed understanding and agreed to proceed.        Objective:    There were no vitals filed for this visit. There is no height or weight on file to calculate BMI.     09/19/2022    3:00 PM 03/15/2022    3:38 PM 09/16/2021    3:37 PM 03/11/2021    3:33 PM 09/23/2020    3:28 PM 05/21/2020    1:35 PM 09/12/2019    3:51 PM  Advanced Directives  Does Patient Have a Medical Advance Directive? No No No No No No No  Would patient like information on creating a medical advance directive? No - Patient declined No - Patient declined No - Patient declined No - Patient declined No - Patient declined No - Patient declined No - Patient declined    Current Medications (verified) Outpatient Encounter Medications as of 10/20/2022  Medication Sig   albuterol (PROAIR HFA) 108 (90 Base) MCG/ACT inhaler Inhale 2 puffs into the lungs every 4 (four) hours as needed.   anastrozole (ARIMIDEX) 1 MG tablet Take 1 tablet by mouth once daily   arformoterol (BROVANA) 15 MCG/2ML NEBU Inhale into the lungs.   aspirin EC 81 MG tablet Take 81 mg by mouth daily.   b complex vitamins capsule Take 1 capsule by mouth daily.   buPROPion (WELLBUTRIN SR) 150 MG 12 hr tablet Take 150 mg by mouth 2 (two) times daily.   cetirizine (ZYRTEC) 10 MG tablet Take 10 mg by mouth daily.   clopidogrel (PLAVIX) 75 MG tablet Take 75 mg by mouth daily.   denosumab (PROLIA) 60 MG/ML SOSY injection Inject 60 mg into the skin every 6 (six) months.   diclofenac Sodium (VOLTAREN) 1 %  GEL Apply topically.   docusate sodium (COLACE) 50 MG capsule Take 50 mg by mouth as needed for mild constipation.   esomeprazole (NEXIUM) 40 MG capsule Take 1 tablet by mouth daily.   hydroxychloroquine (PLAQUENIL) 200 MG tablet Take 250 mg by mouth daily. Pt 1 1/2 tablet daily   leflunomide (ARAVA) 20 MG tablet Take 1 tablet by mouth daily.   levothyroxine (SYNTHROID) 137 MCG tablet Take 1 tablet by mouth daily.   losartan (COZAAR) 25 MG tablet Take 25 mg by mouth daily.   revefenacin (YUPELRI) 175 MCG/3ML nebulizer solution Inhale into the lungs.   simvastatin (ZOCOR) 10 MG tablet Take 10 mg by mouth daily.   sulfaSALAzine (AZULFIDINE) 500 MG tablet Take 500 mg by mouth 3 (three) times daily. Taking 500mg  tablet in the morning and 2 tablets in the evening   traZODone (DESYREL) 100 MG tablet Take 100 mg by mouth at bedtime.   verapamil (CALAN-SR) 240 MG CR tablet Take 240 mg by mouth daily.   vitamin E 400 UNIT capsule Take 400 Units by mouth daily. Pt takes 450 mg daily   No facility-administered encounter medications on file as of 10/20/2022.    Allergies (verified) Amoxicillin-pot clavulanate   History: Past Medical History:  Diagnosis Date   Allergy    COPD (chronic obstructive pulmonary disease) (HCC)  Depression    GERD (gastroesophageal reflux disease)    Hyperlipemia    Hypertension    Hypothyroid    Lung cancer (HCC) 2009   Osteoporosis    PVD (peripheral vascular disease) (HCC)    RA (rheumatoid arthritis) (HCC)    Past Surgical History:  Procedure Laterality Date   BREAST LUMPECTOMY Left 04/23/2019   BREAST LUMPECTOMY WITH RADIOACTIVE SEED AND SENTINEL LYMPH NODE BIOPSY Left 04/23/2019   Procedure: LEFT BREAST LUMPECTOMY WITH RADIOACTIVE SEED AND LEFT AXILLARY SENTINEL LYMPH NODE BIOPSY;  Surgeon: Emelia Loron, MD;  Location: [facility];  Service: General;  Laterality: Left;   CATARACT EXTRACTION, BILATERAL     FEMORAL BYPASS Right 2017    x3   KIDNEY STONE SURGERY     Family History  Problem Relation Age of Onset   Diabetes Mother    Hyperlipidemia Mother    Lung cancer Maternal Aunt    Social History   Socioeconomic History   Marital status: Married    Spouse name: Tinnie Gens   Number of children: Not on file   Years of education: Not on file   Highest education level: Not on file  Occupational History   Occupation: retires  Tobacco Use   Smoking status: Former    Packs/day: 1.00    Years: 43.00    Additional pack years: 0.00    Total pack years: 43.00    Types: Cigarettes    Quit date: 04/12/2015    Years since quitting: 7.5    Passive exposure: Past   Smokeless tobacco: Never  Substance and Sexual Activity   Alcohol use: Not Currently   Drug use: Never   Sexual activity: Not on file  Other Topics Concern   Not on file  Social History Narrative   Lives with husband.   Social Determinants of Health   Financial Resource Strain: Low Risk  (04/01/2019)   Overall Financial Resource Strain (CARDIA)    Difficulty of Paying Living Expenses: Not hard at all  Food Insecurity: No Food Insecurity (04/01/2019)   Hunger Vital Sign    Worried About Running Out of Food in the Last Year: Never true    Ran Out of Food in the Last Year: Never true  Transportation Needs: No Transportation Needs (04/01/2019)   PRAPARE - Administrator, Civil Service (Medical): No    Lack of Transportation (Non-Medical): No  Physical Activity: Sufficiently Active (04/01/2019)   Exercise Vital Sign    Days of Exercise per Week: 7 days    Minutes of Exercise per Session: 60 min  Stress: No Stress Concern Present (04/01/2019)   [employer] of Occupational Health - Occupational Stress Questionnaire    Feeling of Stress : Only a little  Social Connections: Moderately Integrated (04/01/2019)   Social Connection and Isolation Panel [NHANES]    Frequency of Communication with Friends and Family: Twice a week    Frequency of  Social Gatherings with Friends and Family: Twice a week    Attends Religious Services: More than 4 times per year    Active Member of [employer] or Organizations: No    Attends Banker Meetings: Never    Marital Status: Married    Tobacco Counseling Counseling given: Not Answered   Clinical Intake:  How often do you need to have someone help you when you read instructions, pamphlets, or other written materials from your doctor or pharmacy?: (P) 1 - Never  Diabetic?NO  Activities of Daily Living  10/16/2022    9:34 PM 10/11/2022    3:33 PM  In your present state of health, do you have any difficulty performing the following activities:  Hearing? 0 0  Vision? 0 0  Difficulty concentrating or making decisions? 0 0  Walking or climbing stairs? 0 0  Dressing or bathing? 0 0  Doing errands, shopping? 0 0  Preparing Food and eating ? N N  Using the Toilet? N N  In the past six months, have you accidently leaked urine? N N  Do you have problems with loss of bowel control? N N  Managing your Medications? N N  Managing your Finances? N N  Housekeeping or managing your Housekeeping? N N    Patient Care Team: Billie Lade, MD as PCP - General (Internal Medicine) Doreatha Massed, MD as Medical Oncologist (Medical Oncology)  Indicate any recent Medical Services you may have received from other than [facility] providers in the past year (date may be approximate).     Assessment:   This is a routine wellness examination for [place].  Hearing/Vision screen No results found.  Dietary issues and exercise activities discussed:     Goals Addressed   None    Depression Screen    10/03/2022    1:26 PM  PHQ 2/9 Scores  PHQ - 2 Score 0  PHQ- 9 Score 0    Fall Risk    10/16/2022    9:34 PM 10/11/2022    3:33 PM 10/10/2022    2:54 PM 10/03/2022    1:26 PM  Fall Risk   Falls in the past year? 0 0 0 1  Number falls in past yr: 0 0 0 1  Injury with Fall? 0 0 0 1   Risk for fall due to :    Impaired balance/gait;Impaired mobility  Follow up    Falls evaluation completed    FALL RISK PREVENTION PERTAINING TO THE HOME:  Any stairs in or around the home? Yes  If so, are there any without handrails? No  Home free of loose throw rugs in walkways, pet beds, electrical cords, etc? Yes  Adequate lighting in your home to reduce risk of falls? Yes   ASSISTIVE DEVICES UTILIZED TO PREVENT FALLS:  Life alert? No  Use of a cane, walker or w/c? No  Grab bars in the bathroom? No  Shower chair or bench in shower? Yes  Elevated toilet seat or a handicapped toilet? No          Immunizations Immunization History  Administered Date(s) Administered   Fluad Quad(high Dose 65+) 02/20/2022   Influenza Split 03/20/2009, 03/03/2016, 02/10/2017, 02/21/2019   Influenza Whole 06/25/2010, 03/29/2011, 02/21/2012   Influenza, Seasonal, Injecte, Preservative Fre 06/25/2010, 03/29/2011, 02/21/2012, 03/15/2012, 01/28/2013, 03/19/2015, 02/06/2019   Influenza,inj,Quad PF,6+ Mos 01/28/2013, 02/14/2017, 02/20/2018, 03/03/2020, 02/18/2021   Influenza-Unspecified 02/21/2007, 02/21/2007   Moderna Sars-Covid-2 Vaccination 07/23/2019, 08/20/2019, 04/15/2020, 10/14/2020   Pneumococcal Conjugate PCV 7 01/30/2008   Pneumococcal Conjugate,unspecified 01/30/2008   Pneumococcal Conjugate-13 05/29/2013, 08/12/2015   Pneumococcal Polysaccharide-23 01/30/2008, 03/18/2015, 06/01/2020   Tdap 02/21/2012, 02/27/2016   Zoster Recombinat (Shingrix) 08/18/2020    TDAP status: Up to date  Flu Vaccine status: Up to date  Pneumococcal vaccine status: Up to date  Covid-19 vaccine status: Completed vaccines  Qualifies for Shingles Vaccine? Yes   Zostavax completed Yes   Shingrix Completed?: No.    Education has been provided regarding the importance of this vaccine. Patient has been advised to call insurance company to  determine out of pocket expense if they have not yet received this  vaccine. Advised may also receive vaccine at local pharmacy or Health Dept. Verbalized acceptance and understanding.  Screening Tests Health Maintenance  Topic Date Due   Hepatitis C Screening  Never done   DEXA SCAN  Never done   Zoster Vaccines- Shingrix (2 of 2) 10/13/2020   COVID-19 Vaccine (5 - 2023-24 season) 01/21/2022   Medicare Annual Wellness (AWV)  07/24/2022   INFLUENZA VACCINE  12/22/2022   MAMMOGRAM  03/08/2024   DTaP/Tdap/Td (3 - Td or Tdap) 02/26/2026   Colonoscopy  11/03/2030   Pneumonia Vaccine 11+ Years old  Completed   HPV VACCINES  Aged Out   Lung Cancer Screening  Discontinued    Health Maintenance  Health Maintenance Due  Topic Date Due   Hepatitis C Screening  Never done   DEXA SCAN  Never done   Zoster Vaccines- Shingrix (2 of 2) 10/13/2020   COVID-19 Vaccine (5 - 2023-24 season) 01/21/2022   Medicare Annual Wellness (AWV)  07/24/2022    Colorectal cancer screening: Type of screening: Colonoscopy. Completed 06//13/22. Repeat every 10 years  Mammogram status: Completed 03/08/22. Repeat every year  Bone Density status: Ordered  . Pt provided with contact info and advised to call to schedule appt.  Lung Cancer Screening: (Low Dose CT Chest recommended if Age 67-80 years, 30 pack-year currently smoking OR have quit w/in 15years.) does not qualify.   Lung Cancer Screening Referral: NO DISCONTINUED   Additional Screening:  Hepatitis C Screening: does qualify; Completed NO  Vision Screening: Recommended annual ophthalmology exams for early detection of glaucoma and other disorders of the eye. Is the patient up to date with their annual eye exam?  Yes  Who is the provider or what is the name of the office in which the patient attends annual eye exams? N/a If pt is not established with a provider, would they like to be referred to a provider to establish care? No .   Dental Screening: Recommended annual dental exams for proper oral hygiene  Community  Resource Referral / Chronic Care Management: CRR required this visit?  No   CCM required this visit?  No      Plan:     I have personally reviewed and noted the following in the patient's chart:   Medical and social history Use of alcohol, tobacco or illicit drugs  Current medications and supplements including opioid prescriptions. Patient is not currently taking opioid prescriptions. Functional ability and status Nutritional status Physical activity Advanced directives List of other physicians Hospitalizations, surgeries, and ER visits in previous 12 months Vitals Screenings to include cognitive, depression, and falls Referrals and appointments  In addition, I have reviewed and discussed with patient certain preventive protocols, quality metrics, and best practice recommendations. A written personalized care plan for preventive services as well as general preventive health recommendations were provided to patient.     Jasper Riling, CMA   10/20/2022

## 2022-10-20 NOTE — Patient Instructions (Signed)
  Christy Bennett , Thank you for taking time to come for your Medicare Wellness Visit. I appreciate your ongoing commitment to your health goals. Please review the following plan we discussed and let me know if I can assist you in the future.   These are the goals we discussed:  Goals      Patient Stated     Finish fixing up her home .        This is a list of the screening recommended for you and due dates:  Health Maintenance  Topic Date Due   Hepatitis C Screening  Never done   DEXA scan (bone density measurement)  Never done   Zoster (Shingles) Vaccine (2 of 2) 10/13/2020   COVID-19 Vaccine (5 - 2023-24 season) 01/21/2022   Flu Shot  12/22/2022   Medicare Annual Wellness Visit  10/20/2023   Mammogram  03/08/2024   DTaP/Tdap/Td vaccine (3 - Td or Tdap) 02/26/2026   Colon Cancer Screening  11/03/2030   Pneumonia Vaccine  Completed   HPV Vaccine  Aged Out   Screening for Lung Cancer  Discontinued

## 2022-10-21 ENCOUNTER — Other Ambulatory Visit: Payer: Self-pay

## 2022-10-21 MED ORDER — VERAPAMIL HCL ER 240 MG PO TBCR: 240.0000 mg | EXTENDED_RELEASE_TABLET | Freq: Every day | ORAL | 3 refills | Status: DC

## 2022-10-27 ENCOUNTER — Other Ambulatory Visit: Payer: Self-pay

## 2022-10-27 MED ORDER — LEVOTHYROXINE SODIUM 137 MCG PO TABS: 137.0000 ug | ORAL_TABLET | Freq: Every day | ORAL | 0 refills | Status: DC

## 2022-11-04 ENCOUNTER — Other Ambulatory Visit: Payer: Self-pay

## 2022-11-04 MED ORDER — TRAZODONE HCL 100 MG PO TABS: 100.0000 mg | ORAL_TABLET | Freq: Every day | ORAL | 0 refills | Status: DC

## 2022-11-10 ENCOUNTER — Other Ambulatory Visit: Payer: Self-pay

## 2022-11-10 MED ORDER — CLOPIDOGREL BISULFATE 75 MG PO TABS: 75.0000 mg | ORAL_TABLET | Freq: Every day | ORAL | 0 refills | Status: DC

## 2022-11-11 ENCOUNTER — Other Ambulatory Visit: Payer: Self-pay

## 2022-11-11 MED ORDER — BUPROPION HCL ER (SR) 150 MG PO TB12: 150.0000 mg | ORAL_TABLET | Freq: Two times a day (BID) | ORAL | 0 refills | Status: DC

## 2022-11-17 NOTE — Patient Instructions (Signed)
20    Your procedure is scheduled on: 11/22/2022  Report to [facility] Main Entrance at   9:15  AM.  Call this number if you have problems the morning of surgery: [phone]   Remember:   Follow instructions on letter from office regarding when to stop eating and drinking        No Smoking the day of procedure      Take these medicines the morning of surgery with A SIP OF WATER: Nexium, levotyroxine, verapamil Baclofen, zyrtec, and meclizine if needed  Use inhalers if needed  Hold plavix 5 days as instructed in letter from office   Do not wear jewelry, make-up or nail polish.  Do not wear lotions, powders, or perfumes. You may wear deodorant.                Do not bring valuables to the hospital.  Contacts, dentures or bridgework may not be worn into surgery.  Leave suitcase in the car. After surgery it may be brought to your room.  For patients admitted to the hospital, checkout time is 11:00 AM the day of discharge.   Patients discharged the day of surgery will not be allowed to drive home. Upper Endoscopy, Adult Upper endoscopy is a procedure to look inside the upper GI (gastrointestinal) tract. The upper GI tract is made up of: The part of the body that moves food from your mouth to your stomach (esophagus). The stomach. The first part of your small intestine (duodenum). This procedure is also called esophagogastroduodenoscopy (EGD) or gastroscopy. In this procedure, your health care provider passes a thin, flexible tube (endoscope) through your mouth and down your esophagus into your stomach. A small camera is attached to the end of the tube. Images from the camera appear on a monitor in the exam room. During this procedure, your health care provider may also remove a small piece of tissue to be sent to a lab and examined under a microscope (biopsy). Your health care provider may do an upper endoscopy to diagnose cancers of the upper GI tract. You may also have this procedure to  find the cause of other conditions, such as: Stomach pain. Heartburn. Pain or problems when swallowing. Nausea and vomiting. Stomach bleeding. Stomach ulcers. Tell a health care provider about: Any allergies you have. All medicines you are taking, including vitamins, herbs, eye drops, creams, and over-the-counter medicines. Any problems you or family members have had with anesthetic medicines. Any blood disorders you have. Any surgeries you have had. Any medical conditions you have. Whether you are pregnant or may be pregnant. What are the risks? Generally, this is a safe procedure. However, problems may occur, including: Infection. Bleeding. Allergic reactions to medicines. A tear or hole (perforation) in the esophagus, stomach, or duodenum. What happens before the procedure? Staying hydrated Follow instructions from your health care provider about hydration, which may include: Up to 4 hours before the procedure - you may continue to drink clear liquids, such as water, clear fruit juice, black coffee, and plain tea.   Medicines Ask your health care provider about: Changing or stopping your regular medicines. This is especially important if you are taking diabetes medicines or blood thinners. Taking medicines such as aspirin and ibuprofen. These medicines can thin your blood. Do not take these medicines unless your health care provider tells you to take them. Taking over-the-counter medicines, vitamins, herbs, and supplements. General instructions Plan to have someone take you home from the hospital or  clinic. If you will be going home right after the procedure, plan to have someone with you for 24 hours. Ask your health care provider what steps will be taken to help prevent infection. What happens during the procedure?  An IV will be inserted into one of your veins. You may be given one or more of the following: A medicine to help you relax (sedative). A medicine to numb the  throat (local anesthetic). You will lie on your left side on an exam table. Your health care provider will pass the endoscope through your mouth and down your esophagus. Your health care provider will use the scope to check the inside of your esophagus, stomach, and duodenum. Biopsies may be taken. The endoscope will be removed. The procedure may vary among health care providers and hospitals. What happens after the procedure? Your blood pressure, heart rate, breathing rate, and blood oxygen level will be monitored until you leave the hospital or clinic. Do not drive for 24 hours if you were given a sedative during your procedure. When your throat is no longer numb, you may be given some fluids to drink. It is up to you to get the results of your procedure. Ask your health care provider, or the department that is doing the procedure, when your results will be ready. Summary Upper endoscopy is a procedure to look inside the upper GI tract. During the procedure, an IV will be inserted into one of your veins. You may be given a medicine to help you relax. A medicine will be used to numb your throat. The endoscope will be passed through your mouth and down your esophagus. This information is not intended to replace advice given to you by your health care provider. Make sure you discuss any questions you have with your health care provider. Document Revised: 11/01/2017 Document Reviewed: 10/09/2017 Elsevier Patient Education  2020 [employer]vier [employer].                                                                                                                                      EndoscopyCare After  Please read the instructions outlined below and refer to this sheet in the next few weeks. These discharge instructions provide you with general information on caring for yourself after you leave the hospital. Your doctor may also give you specific instructions. While your treatment has been planned  according to the most current medical practices available, unavoidable complications occasionally occur. If you have any problems or questions after discharge, please call your doctor. HOME CARE INSTRUCTIONS Activity You may resume your regular activity but move at a slower pace for the next 24 hours.  Take frequent rest periods for the next 24 hours.  Walking will help expel (get rid of) the air and reduce the bloated feeling in your abdomen.  No driving for 24 hours (because of the anesthesia (medicine) used during the test).  You  may shower.  Do not sign any important legal documents or operate any machinery for 24 hours (because of the anesthesia used during the test).  Nutrition Drink plenty of fluids.  You may resume your normal diet.  Begin with a light meal and progress to your normal diet.  Avoid alcoholic beverages for 24 hours or as instructed by your caregiver.  Medications You may resume your normal medications unless your caregiver tells you otherwise. What you can expect today You may experience abdominal discomfort such as a feeling of fullness or "gas" pains.  You may experience a sore throat for 2 to 3 days. This is normal. Gargling with salt water may help this.  Follow-up Your doctor will discuss the results of your test with you. SEEK IMMEDIATE MEDICAL CARE IF: You have excessive nausea (feeling sick to your stomach) and/or vomiting.  You have severe abdominal pain and distention (swelling).  You have trouble swallowing.  You have a temperature over 100 F (37.8 C).  You have rectal bleeding or vomiting of blood.  Document Released: 12/22/2003 Document Revised: 04/28/2011 Document Reviewed: 07/04/2007

## 2022-11-18 ENCOUNTER — Other Ambulatory Visit: Payer: Self-pay

## 2022-11-18 ENCOUNTER — Other Ambulatory Visit: Payer: Self-pay | Admitting: Internal Medicine

## 2022-11-18 ENCOUNTER — Encounter (HOSPITAL_COMMUNITY)
Admission: RE | Admit: 2022-11-18 | Discharge: 2022-11-18 | Disposition: A | Discharge status: Home / Self Care | Payer: Medicare Other | Source: Ambulatory Visit | Attending: Gastroenterology | Admitting: Gastroenterology

## 2022-11-18 ENCOUNTER — Encounter (HOSPITAL_COMMUNITY): Payer: Self-pay

## 2022-11-18 VITALS — BP 182/64 | HR 68 | Temp 97.6°F | Resp 18 | Ht 62.0 in | Wt 115.0 lb

## 2022-11-18 DIAGNOSIS — I10 Essential (primary) hypertension: Secondary | ICD-10-CM | POA: Insufficient documentation

## 2022-11-18 HISTORY — DX: Tremor, unspecified: R25.1

## 2022-11-18 HISTORY — DX: Other complications of anesthesia, initial encounter: T88.59XA

## 2022-11-21 ENCOUNTER — Telehealth: Payer: Self-pay | Admitting: Internal Medicine

## 2022-11-21 ENCOUNTER — Other Ambulatory Visit: Payer: Self-pay | Admitting: Internal Medicine

## 2022-11-21 DIAGNOSIS — J449 Chronic obstructive pulmonary disease, unspecified: Secondary | ICD-10-CM

## 2022-11-21 NOTE — Telephone Encounter (Signed)
Patient states needs refill for Brovana. Pharmacy is [facility] order. Phone number is [phone]. Patient phone number is [phone].

## 2022-11-22 ENCOUNTER — Ambulatory Visit (HOSPITAL_COMMUNITY): Payer: Medicare Other | Admitting: Certified Registered"

## 2022-11-22 ENCOUNTER — Encounter (HOSPITAL_COMMUNITY): Payer: Self-pay | Admitting: Gastroenterology

## 2022-11-22 ENCOUNTER — Ambulatory Visit (HOSPITAL_COMMUNITY)
Admission: RE | Admit: 2022-11-22 | Discharge: 2022-11-22 | Disposition: A | Discharge status: Home / Self Care | Payer: Medicare Other | Source: Ambulatory Visit | Attending: Gastroenterology | Admitting: Gastroenterology

## 2022-11-22 ENCOUNTER — Encounter (HOSPITAL_COMMUNITY)
Admission: RE | Disposition: A | Discharge status: Home / Self Care | Payer: Self-pay | Source: Ambulatory Visit | Attending: Gastroenterology

## 2022-11-22 ENCOUNTER — Telehealth (INDEPENDENT_AMBULATORY_CARE_PROVIDER_SITE_OTHER): Payer: Self-pay

## 2022-11-22 DIAGNOSIS — I10 Essential (primary) hypertension: Secondary | ICD-10-CM | POA: Insufficient documentation

## 2022-11-22 DIAGNOSIS — K222 Esophageal obstruction: Secondary | ICD-10-CM | POA: Insufficient documentation

## 2022-11-22 DIAGNOSIS — G4700 Insomnia, unspecified: Secondary | ICD-10-CM | POA: Diagnosis not present

## 2022-11-22 DIAGNOSIS — F32A Depression, unspecified: Secondary | ICD-10-CM | POA: Diagnosis not present

## 2022-11-22 DIAGNOSIS — Z87891 Personal history of nicotine dependence: Secondary | ICD-10-CM | POA: Diagnosis not present

## 2022-11-22 DIAGNOSIS — K2100 Gastro-esophageal reflux disease with esophagitis, without bleeding: Secondary | ICD-10-CM

## 2022-11-22 DIAGNOSIS — Z95828 Presence of other vascular implants and grafts: Secondary | ICD-10-CM | POA: Insufficient documentation

## 2022-11-22 DIAGNOSIS — M810 Age-related osteoporosis without current pathological fracture: Secondary | ICD-10-CM | POA: Insufficient documentation

## 2022-11-22 DIAGNOSIS — K449 Diaphragmatic hernia without obstruction or gangrene: Secondary | ICD-10-CM | POA: Insufficient documentation

## 2022-11-22 DIAGNOSIS — M069 Rheumatoid arthritis, unspecified: Secondary | ICD-10-CM | POA: Diagnosis not present

## 2022-11-22 DIAGNOSIS — K219 Gastro-esophageal reflux disease without esophagitis: Secondary | ICD-10-CM | POA: Diagnosis not present

## 2022-11-22 DIAGNOSIS — F411 Generalized anxiety disorder: Secondary | ICD-10-CM | POA: Diagnosis not present

## 2022-11-22 DIAGNOSIS — R1310 Dysphagia, unspecified: Secondary | ICD-10-CM | POA: Diagnosis present

## 2022-11-22 DIAGNOSIS — Z85118 Personal history of other malignant neoplasm of bronchus and lung: Secondary | ICD-10-CM | POA: Insufficient documentation

## 2022-11-22 DIAGNOSIS — Z923 Personal history of irradiation: Secondary | ICD-10-CM | POA: Diagnosis not present

## 2022-11-22 DIAGNOSIS — E039 Hypothyroidism, unspecified: Secondary | ICD-10-CM | POA: Insufficient documentation

## 2022-11-22 DIAGNOSIS — Z9221 Personal history of antineoplastic chemotherapy: Secondary | ICD-10-CM | POA: Diagnosis not present

## 2022-11-22 DIAGNOSIS — I739 Peripheral vascular disease, unspecified: Secondary | ICD-10-CM | POA: Insufficient documentation

## 2022-11-22 DIAGNOSIS — K2289 Other specified disease of esophagus: Secondary | ICD-10-CM | POA: Insufficient documentation

## 2022-11-22 DIAGNOSIS — J439 Emphysema, unspecified: Secondary | ICD-10-CM | POA: Insufficient documentation

## 2022-11-22 DIAGNOSIS — E785 Hyperlipidemia, unspecified: Secondary | ICD-10-CM | POA: Diagnosis not present

## 2022-11-22 DIAGNOSIS — C50912 Malignant neoplasm of unspecified site of left female breast: Secondary | ICD-10-CM | POA: Insufficient documentation

## 2022-11-22 HISTORY — PX: ESOPHAGOGASTRODUODENOSCOPY (EGD) WITH PROPOFOL: SHX5813

## 2022-11-22 HISTORY — PX: BIOPSY: SHX5522

## 2022-11-22 HISTORY — PX: ESOPHAGEAL DILATION: SHX303

## 2022-11-22 SURGERY — ESOPHAGOGASTRODUODENOSCOPY (EGD) WITH PROPOFOL
Anesthesia: General

## 2022-11-22 MED ORDER — LACTATED RINGERS IV SOLN: INTRAVENOUS | Status: DC

## 2022-11-22 MED ORDER — LACTATED RINGERS IV SOLN: INTRAVENOUS | Status: DC | PRN

## 2022-11-22 MED ORDER — ESOMEPRAZOLE MAGNESIUM 20 MG PO CPDR: 20.0000 mg | DELAYED_RELEASE_CAPSULE | Freq: Every evening | ORAL | 3 refills | Status: DC

## 2022-11-22 MED ORDER — LIDOCAINE HCL (CARDIAC) PF 100 MG/5ML IV SOSY
PREFILLED_SYRINGE | INTRAVENOUS | Status: DC | PRN
  Administered 2022-11-22: 50 mg via INTRAVENOUS

## 2022-11-22 MED ORDER — PROPOFOL 10 MG/ML IV BOLUS
INTRAVENOUS | Status: DC | PRN
  Administered 2022-11-22: 20 mg via INTRAVENOUS
  Administered 2022-11-22: 50 mg via INTRAVENOUS
  Administered 2022-11-22: 100 mg via INTRAVENOUS

## 2022-11-22 NOTE — Telephone Encounter (Signed)
Can she buy the over the counter ? Its the same dosage

## 2022-11-22 NOTE — Anesthesia Postprocedure Evaluation (Signed)
Anesthesia Post Note  Patient: Breshay Gleba  Procedure(s) Performed: ESOPHAGOGASTRODUODENOSCOPY (EGD) WITH PROPOFOL ESOPHAGEAL DILATION (Left) BIOPSY  Patient location during evaluation: Short Stay Anesthesia Type: General Level of consciousness: awake and alert Pain management: pain level controlled Vital Signs Assessment: post-procedure vital signs reviewed and stable Respiratory status: spontaneous breathing Cardiovascular status: blood pressure returned to baseline and stable Postop Assessment: no apparent nausea or vomiting Anesthetic complications: no   No notable events documented.   Last Vitals:  Vitals:   11/22/22 0948  BP: (!) 171/60  Pulse: 73  Resp: 15  Temp: 36.4 C  SpO2: 96%    Last Pain:  Vitals:   11/22/22 1132  TempSrc:   PainSc: 0-No pain                 Dariann Huckaba

## 2022-11-22 NOTE — Op Note (Signed)
[facility] Patient Name: Christy Bennett Procedure Date: 11/22/2022 11:12 AM MRN: [id number] Date of Birth: 1955/11/28 Attending MD: Katrinka Blazing , , [id number] CSN: [id number] Age: 67 Admit Type: Outpatient Procedure:                Upper GI endoscopy Indications:              Dysphagia Providers:                Katrinka Blazing, Sheran Fava Dyann Ruddle Referring MD:              Medicines:                Monitored Anesthesia Care Complications:            No immediate complications. Estimated Blood Loss:     Estimated blood loss: none. Procedure:                Pre-Anesthesia Assessment:                           - Prior to the procedure, a History and Physical                            was performed, and patient medications, allergies                            and sensitivities were reviewed. The patient's                            tolerance of previous anesthesia was reviewed.                           - The risks and benefits of the procedure and the                            sedation options and risks were discussed with the                            patient. All questions were answered and informed                            consent was obtained.                           - ASA Grade Assessment: II - A patient with mild                            systemic disease.                           After obtaining informed consent, the endoscope was                            passed under direct vision. Throughout the                            procedure, the patient's blood pressure, pulse, and  oxygen saturations were monitored continuously. The                            GIF-H190 ([id number]) scope was introduced through the                            mouth, and advanced to the second part of duodenum.                            The upper GI endoscopy was accomplished without                            difficulty. The patient tolerated  the procedure                            well. Scope In: 11:36:25 AM Scope Out: 11:43:23 AM Total Procedure Duration: 0 hours 6 minutes 58 seconds  Findings:      One benign-appearing, intrinsic moderate (circumferential scarring or       stenosis; an endoscope may pass) stenosis was found at the       gastroesophageal junction. This stenosis measured 1.5 cm (inner       diameter) x less than one cm (in length). The stenosis was traversed.       Biopsies were taken with a cold forceps for histology. A TTS dilator was       passed through the scope. Dilation with a 15-16.5-18 mm balloon dilator       was performed to 18 mm. The dilation site was examined and showed       moderate mucosal disruption.      A 1 cm hiatal hernia was present.      The stomach was normal.      The examined duodenum was normal. Impression:               - Benign-appearing esophageal stenosis. Biopsied.                            Dilated.                           - 1 cm hiatal hernia.                           - Normal stomach.                           - Normal examined duodenum. Moderate Sedation:      Per Anesthesia Care Recommendation:           - Discharge patient to home (ambulatory).                           - Resume previous diet.                           - Await pathology results.                           - Continue Nexium 40  mg at night and take 20 mg at                            night.                           - Patient should take medication in the morning                            30-45 minutes before eating breakfast and super.                            Avoid eating within 2 hours of lying down to sleep                            and benefit of blocks to elevate head of bed. Also,                            will benefit from avoiding carbonated drinks/sodas                            or food that has tomatoes, spicy or greasy food. Procedure Code(s):        --- Professional ---                            [phone], Esophagogastroduodenoscopy, flexible,                            transoral; with transendoscopic balloon dilation of                            esophagus (less than 30 mm diameter)                           43239, 59, Esophagogastroduodenoscopy, flexible,                            transoral; with biopsy, single or multiple Diagnosis Code(s):        --- Professional ---                           K22.2, Esophageal obstruction                           K44.9, Diaphragmatic hernia without obstruction or                            gangrene                           R13.10, Dysphagia, unspecified CPT copyright 2022 American Medical Association. All rights reserved. The codes documented in this report are preliminary and upon coder review may  be revised to meet current compliance requirements. Katrinka Blazing, MD Katrinka Blazing,  11/22/2022 11:50:40 AM This report has been signed electronically. Number of Addenda: 0

## 2022-11-22 NOTE — Telephone Encounter (Signed)
Sent Prior Auth to patient the patient insurance company.

## 2022-11-22 NOTE — Telephone Encounter (Signed)
She was previously taking the 40 in am, which she should continue taking. I added the 20 mg dosage for night. Hence, 40 mg AM and 20 mg PM

## 2022-11-22 NOTE — Telephone Encounter (Signed)
You can appeal if you like. I have the forms on your desk.   Date: 11/22/2022  Enrollee's Name: LOLETIA HARMELINK Member Number: [id number] Your request was denied   We have denied coverage or payment under your Medicare Part D benefit for the following prescription drug(s) that you or your prescriber requested: ESOMEPRAZOLE MAGNESIUM Capsule DR   Why did we deny your request? We denied this request under Medicare Part D because: The requested drug is not on your plan's formulary (list of covered drugs).   Your Medicare Part D drug plan was asked to cover a drug that is not on the formulary (this is called a formulary exception). Your prescriber did not provide the detailed information that is required in order to approve the request. To receive a formulary exception, per the Part D Coverage Determination guidance Section 40.5.2 and 40.5.3, your prescriber must provide information that documents at least one of the following has occurred: - You have tried the formulary drugs for the treatment of your condition and they did not work for you. OR - The formulary drugs could cause adverse effects. OR - The formulary drugs would be less effective for your condition than the requested drug. Talk to your prescriber to see if the following covered alternative(s) would be right for you: Lansoprazole DR capsule 15mg , 30mg  (Different quantity limits apply depending on the strength of the medication prescribed. Please consult the Medicare Part D plan's formulary for the specific quantity limit.) Omeprazole DR capsule 10mg , 20mg , 40mg  (Different quantity limits apply depending on the strength of the medication prescribed. Please consult the Medicare Part D plan's formulary for the specific quantity limit.) Form [id number] OMB Approval No. [id number] (Expires 05/23/2023) Pantoprazole Sodium DR tablet 20mg , 40mg  (Different quantity limits apply depending on the strength of the medication prescribed.  Please consult the Medicare Part D plan's formulary for the specific quantity limit.) Rabeprazole Sodium DR tablet 20mg  (Quantity limit of 30 tablets per 30 days.) Dexlansoprazole DR capsule (Quantity limit of 30 capsules per 30 days.) You should share a copy of this decision with your prescriber so you and your prescriber can discuss next steps. If your prescriber requested coverage on your behalf, we have shared this decision with your prescriber. What If I Don't Agree With This Decision? You have the right to appeal. If you want to appeal, you must request your appeal within 60 calendar days after the date of this notice. We can give you more time if you have a good reason for missing the deadline. You have the right to ask [place] for a formulary exception if you believe you need a drug that is not on our list of covered drugs (formulary). You have the right to ask [place] for a coverage rule exception if you believe a rule such as prior authorization or a quantity limit should not apply to you. You can either provide information that shows that you meet the coverage rule that applies to the drug you are requesting or you can ask for a coverage rule exception. You can ask for a tiering exception if you believe you should get a drug at a lower cost-sharing amount. Your prescriber must provide a statement to support your exception request. Who May Request an Appeal? You, your prescriber, or your representative may request an expedited (fast) or standard appeal. You can name a relative, friend, advocate, attorney, doctor, or someone else to be your representative. Others may already be authorized under State law to be your representative.  You can call us at: [phone] to learn how to appoint a representative. If you have a hearing or speech impairment, please call us at TTY: [phone]. IMPORTANT INFORMATION ABOUT YOUR APPEAL RIGHTS

## 2022-11-22 NOTE — H&P (Signed)
Christy Bennett is an 67 y.o. female.   Chief Complaint: dysphagia HPI: Christy Bennett is a 67 y.o. 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  Patient reports recurrent episode of dysphagia for the last year to solids.  Not presenting dysphagia to liquids.  No weight loss, heartburn or odynophagia.  Past Medical History:  Diagnosis Date   Allergy    Anxiety    Clotting disorder (HCC)    Im on a blood thinner and bruise easily   Complication of anesthesia    difficulty waking up after anesthesia   COPD (chronic obstructive pulmonary disease) (HCC)    Depression    Emphysema of lung (HCC)    GERD (gastroesophageal reflux disease)    Hyperlipemia    Hypertension    Hypothyroid    Lung cancer (HCC) 2009   Neuromuscular disorder (HCC)    Osteoporosis    PVD (peripheral vascular disease) (HCC)    RA (rheumatoid arthritis) (HCC)    Tremors of nervous system     Past Surgical History:  Procedure Laterality Date   BREAST LUMPECTOMY Left 04/23/2019   BREAST LUMPECTOMY WITH RADIOACTIVE SEED AND SENTINEL LYMPH NODE BIOPSY Left 04/23/2019   Procedure: LEFT BREAST LUMPECTOMY WITH RADIOACTIVE SEED AND LEFT AXILLARY SENTINEL LYMPH NODE BIOPSY;  Surgeon: Emelia Loron, MD;  Location: [facility];  Service: General;  Laterality: Left;   CATARACT EXTRACTION, BILATERAL     EYE SURGERY     Cataract surgery x2   FEMORAL BYPASS Right 2017   x3   KIDNEY STONE SURGERY      Family History  Problem Relation Age of Onset   Diabetes Mother    Hyperlipidemia Mother    Arthritis Mother    Heart  disease Mother    Hypertension Mother    Kidney disease Mother    Lung cancer Maternal Aunt    Social History:  reports that she quit smoking about 7 years ago. Her smoking use included cigarettes. She has a 43.00 pack-year smoking history. She has been exposed to tobacco smoke. She has never used smokeless tobacco. She reports that she does not currently use alcohol. She reports that she does not use drugs.  Allergies:  Allergies  Allergen Reactions   Amoxicillin-Pot Clavulanate Diarrhea, Nausea And Vomiting and Nausea Only    abdominal pain    Medications Prior to Admission  Medication Sig Dispense Refill   anastrozole (ARIMIDEX) 1 MG tablet Take 1 tablet by mouth once daily 90 tablet 1   arformoterol (BROVANA) 15 MCG/2ML NEBU Take 15 mcg by nebulization in the morning and at bedtime.     ascorbic acid (VITAMIN C) 500 MG tablet Take 500 mg by mouth daily.     aspirin EC 81 MG tablet Take 81 mg by mouth daily.     b complex vitamins capsule Take 1 capsule by mouth daily.     buPROPion (WELLBUTRIN SR) 150 MG 12 hr tablet Take 1 tablet (150 mg total) by mouth 2 (two) times daily. 180 tablet 0   Calcium Carb-Cholecalciferol (CALCIUM 600/VITAMIN D PO) Take 1 tablet by mouth in the morning and at bedtime.  cetirizine (ZYRTEC) 10 MG tablet Take 10 mg by mouth daily.     clopidogrel (PLAVIX) 75 MG tablet Take 1 tablet (75 mg total) by mouth daily. 90 tablet 0   esomeprazole (NEXIUM) 40 MG capsule Take 40 mg by mouth daily.     hydroxychloroquine (PLAQUENIL) 200 MG tablet Take 300 mg by mouth daily.     hydroxypropyl methylcellulose / hypromellose (ISOPTO TEARS / GONIOVISC) 2.5 % ophthalmic solution Place 1 drop into both eyes as needed for dry eyes.     ibuprofen (ADVIL) 600 MG tablet Take 600 mg by mouth every 6 (six) hours as needed for moderate pain.     leflunomide (ARAVA) 20 MG tablet Take 20 mg by mouth daily.     levothyroxine (SYNTHROID) 137 MCG tablet Take 1 tablet (137 mcg total)  by mouth daily. 90 tablet 0   losartan (COZAAR) 25 MG tablet Take 25 mg by mouth daily.     revefenacin (YUPELRI) 175 MCG/3ML nebulizer solution Take 175 mcg by nebulization daily.     simvastatin (ZOCOR) 10 MG tablet Take 10 mg by mouth daily.     sulfaSALAzine (AZULFIDINE) 500 MG tablet Take 500-1,000 mg by mouth See admin instructions. Taking 500 mg by mouth in the morning and 1000 mg in the evening     traZODone (DESYREL) 100 MG tablet Take 1 tablet (100 mg total) by mouth at bedtime. 90 tablet 0   valACYclovir (VALTREX) 500 MG tablet Take 2,000 mg by mouth 2 (two) times daily as needed (fever blisters).     verapamil (CALAN-SR) 240 MG CR tablet Take 1 tablet (240 mg total) by mouth daily. 90 tablet 3   Vitamin E 268 MG (400 UNIT) TABS Take 400 Units by mouth daily.     albuterol (PROAIR HFA) 108 (90 Base) MCG/ACT inhaler Inhale 2 puffs into the lungs every 4 (four) hours as needed for shortness of breath or wheezing.     baclofen (LIORESAL) 20 MG tablet Take 20 mg by mouth 3 (three) times daily as needed for muscle spasms.     denosumab (PROLIA) 60 MG/ML SOSY injection Inject 60 mg into the skin every 6 (six) months.     diclofenac Sodium (VOLTAREN) 1 % GEL Apply 2 g topically 3 (three) times daily as needed (pain).     docusate sodium (COLACE) 50 MG capsule Take 50 mg by mouth as needed for mild constipation.     meclizine (ANTIVERT) 25 MG tablet Take 25 mg by mouth 3 (three) times daily as needed for dizziness.      No results found for this or any previous visit (from the past 48 hour(s)). No results found.  Review of Systems  HENT:  Positive for trouble swallowing.   All other systems reviewed and are negative.   Blood pressure (!) 171/60, pulse 73, temperature 97.6 F (36.4 C), temperature source Oral, resp. rate 15, height 5\' 2"  (1.575 m), weight 52.1 kg, SpO2 96 %. Physical Exam  GENERAL: The patient is AO x3, in no acute distress. HEENT: Head is normocephalic and atraumatic.  EOMI are intact. Mouth is well hydrated and without lesions. NECK: Supple. No masses LUNGS: Clear to auscultation. No presence of rhonchi/wheezing/rales. Adequate chest expansion HEART: RRR, normal s1 and s2. ABDOMEN: Soft, nontender, no guarding, no peritoneal signs, and nondistended. BS +. No masses. EXTREMITIES: Without any cyanosis, clubbing, rash, lesions or edema. NEUROLOGIC: AOx3, no focal motor deficit. SKIN: no jaundice, no rashes  Assessment/Plan  Christy Bennett is  a 67 y.o. 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  Will proceed with EGD.  Dolores Frame, MD 11/22/2022, 10:55 AM

## 2022-11-22 NOTE — Anesthesia Preprocedure Evaluation (Signed)
Anesthesia Evaluation  Patient identified by MRN, date of birth, ID band Patient awake    Reviewed: Allergy & Precautions, H&P , NPO status , Patient's Chart, lab work & pertinent test results, reviewed documented beta blocker date and time   History of Anesthesia Complications (+) history of anesthetic complications  Airway Mallampati: II  TM Distance: >3 FB Neck ROM: full    Dental no notable dental hx.    Pulmonary neg pulmonary ROS, COPD, former smoker   Pulmonary exam normal breath sounds clear to auscultation       Cardiovascular Exercise Tolerance: Good hypertension, + Peripheral Vascular Disease  negative cardio ROS  Rhythm:regular Rate:Normal     Neuro/Psych  PSYCHIATRIC DISORDERS Anxiety Depression     Neuromuscular disease negative neurological ROS  negative psych ROS   GI/Hepatic negative GI ROS, Neg liver ROS,GERD  ,,  Endo/Other  negative endocrine ROSHypothyroidism    Renal/GU negative Renal ROS  negative genitourinary   Musculoskeletal   Abdominal   Peds  Hematology negative hematology ROS (+)   Anesthesia Other Findings   Reproductive/Obstetrics negative OB ROS                             Anesthesia Physical Anesthesia Plan  ASA: 2  Anesthesia Plan: General   Post-op Pain Management:    Induction:   PONV Risk Score and Plan: Propofol infusion  Airway Management Planned:   Additional Equipment:   Intra-op Plan:   Post-operative Plan:   Informed Consent: I have reviewed the patients History and Physical, chart, labs and discussed the procedure including the risks, benefits and alternatives for the proposed anesthesia with the patient or authorized representative who has indicated his/her understanding and acceptance.     Dental Advisory Given  Plan Discussed with: CRNA  Anesthesia Plan Comments:        Anesthesia Quick Evaluation

## 2022-11-22 NOTE — Discharge Instructions (Addendum)
You are being discharged to home.  Resume your previous diet.  We are waiting for your pathology results.  Continue Nexium 40 mg at morning and take 20 mg at night. Patient should take medication in the morning 30-45 minutes before eating breakfast and super. Avoid eating within 2 hours of lying down to sleep and benefit of blocks to elevate head of bed. Also, will benefit from avoiding carbonated drinks/sodas or food that has tomatoes, spicy or greasy food. Restart Plavix tomorrow

## 2022-11-22 NOTE — Transfer of Care (Signed)
Immediate Anesthesia Transfer of Care Note  Patient: Christy Bennett  Procedure(s) Performed: ESOPHAGOGASTRODUODENOSCOPY (EGD) WITH PROPOFOL ESOPHAGEAL DILATION (Left) BIOPSY  Patient Location: Short Stay  Anesthesia Type:General  Level of Consciousness: awake  Airway & Oxygen Therapy: Patient Spontanous Breathing  Post-op Assessment: Report given to RN  Post vital signs: Reviewed and stable  Last Vitals:  Vitals Value Taken Time  BP    Temp    Pulse    Resp    SpO2      Last Pain:  Vitals:   11/22/22 1132  TempSrc:   PainSc: 0-No pain         Complications: No notable events documented.

## 2022-11-22 NOTE — Telephone Encounter (Signed)
How is patient supposed to take esomeprazole ? I have to get a prior Auth. Is it 40 mg in the am and 20 at bedtime? Only 20 mg at bedtime was sent in today, but she has another script for 40 mg once per day that was written on 11/26/2019. Please advise/clarify. Thanks   Recommendation:           - Discharge patient to home (ambulatory).                           - Resume previous diet.                           - Await pathology results.                           - Continue Nexium 40 mg at night and take 20 mg at                            night.

## 2022-11-23 LAB — SURGICAL PATHOLOGY

## 2022-11-23 NOTE — Telephone Encounter (Signed)
I called left a message asked that the patient please return cal.

## 2022-11-23 NOTE — Telephone Encounter (Signed)
Per Dr. Levon Hedger ask patient if she has tried omeprazole or Dexlansoprazole when she calls back.

## 2022-11-25 MED ORDER — ARFORMOTEROL TARTRATE 15 MCG/2ML IN NEBU
15.0000 ug | INHALATION_SOLUTION | Freq: Two times a day (BID) | RESPIRATORY_TRACT | 4 refills | Status: DC
Start: 2022-11-25 — End: 2023-12-06

## 2022-11-25 NOTE — Telephone Encounter (Signed)
Called and spoke w/ pt let her know that I was able to refill her medication through [facility] per her request. NFN att.

## 2022-11-28 ENCOUNTER — Other Ambulatory Visit: Payer: Self-pay

## 2022-11-28 ENCOUNTER — Encounter (INDEPENDENT_AMBULATORY_CARE_PROVIDER_SITE_OTHER): Payer: Self-pay | Admitting: *Deleted

## 2022-11-28 MED ORDER — ESOMEPRAZOLE MAGNESIUM 20 MG PO CPDR
DELAYED_RELEASE_CAPSULE | ORAL | 3 refills | Status: DC
Start: 2022-11-28 — End: 2023-01-26

## 2022-11-28 MED ORDER — LOSARTAN POTASSIUM 25 MG PO TABS: 25.0000 mg | ORAL_TABLET | Freq: Every day | ORAL | 3 refills | Status: DC

## 2022-11-28 NOTE — Telephone Encounter (Signed)
I called and left a message asked that the patient please return call to the office. 

## 2022-11-28 NOTE — Telephone Encounter (Signed)
Patient says she has tried omeprazole and has not tried dexilant. Patient wants [place] to send in a script for the Esomeprazole two 20 mg tablets in am and one 20 mg at bedtime. She says to send in this way to [facility] in [place], [place] and she will use a good rx card for the cost as this is the only medication she says that works.   I spoke with the patient and made her aware The biopsies showed mild inflammation of the area where your esophagus connects to the stomach. No precancerous changes were noted.  There is no need to repeat an EGD unless you are trouble swallowing recurs.  You should take your medications as directed.

## 2022-11-28 NOTE — Telephone Encounter (Signed)
Medication sent to pharmacy (two 20 mg pills in the morning and 20 mg at night)

## 2022-11-29 ENCOUNTER — Encounter (HOSPITAL_COMMUNITY): Payer: Self-pay | Admitting: Gastroenterology

## 2022-11-29 NOTE — Telephone Encounter (Signed)
Noted  

## 2023-01-04 ENCOUNTER — Encounter: Payer: Self-pay | Admitting: Internal Medicine

## 2023-01-04 ENCOUNTER — Ambulatory Visit (INDEPENDENT_AMBULATORY_CARE_PROVIDER_SITE_OTHER): Payer: Medicare Other | Admitting: Internal Medicine

## 2023-01-04 VITALS — BP 115/79 | HR 84 | Ht 62.0 in | Wt 121.2 lb

## 2023-01-04 DIAGNOSIS — M810 Age-related osteoporosis without current pathological fracture: Secondary | ICD-10-CM | POA: Diagnosis not present

## 2023-01-04 DIAGNOSIS — I10 Essential (primary) hypertension: Secondary | ICD-10-CM | POA: Diagnosis not present

## 2023-01-04 DIAGNOSIS — M069 Rheumatoid arthritis, unspecified: Secondary | ICD-10-CM

## 2023-01-04 DIAGNOSIS — Z131 Encounter for screening for diabetes mellitus: Secondary | ICD-10-CM

## 2023-01-04 DIAGNOSIS — E039 Hypothyroidism, unspecified: Secondary | ICD-10-CM | POA: Diagnosis not present

## 2023-01-04 DIAGNOSIS — Z1159 Encounter for screening for other viral diseases: Secondary | ICD-10-CM

## 2023-01-04 DIAGNOSIS — E785 Hyperlipidemia, unspecified: Secondary | ICD-10-CM

## 2023-01-04 DIAGNOSIS — Z0001 Encounter for general adult medical examination with abnormal findings: Secondary | ICD-10-CM

## 2023-01-04 HISTORY — DX: Encounter for general adult medical examination with abnormal findings: Z00.01

## 2023-01-04 MED ORDER — VALACYCLOVIR HCL 500 MG PO TABS: 2000.0000 mg | ORAL_TABLET | Freq: Two times a day (BID) | ORAL | 1 refills | Status: AC | PRN

## 2023-01-04 NOTE — Patient Instructions (Signed)
It was a pleasure to see you today.  Thank you for giving [place] the opportunity to be involved in your care.  Below is a brief recap of your visit and next steps.  We will plan to see you again in 6 months.  Summary Annual exam completed today No medication changes made Repeat labs ordered Follow up in 6 months I recommend getting your flu shot this fall

## 2023-01-04 NOTE — Progress Notes (Signed)
Complete physical exam  Patient: Christy Bennett   DOB: Oct 16, 1955   68 y.o. Female  MRN: [id number]  Subjective:    Chief Complaint  Patient presents with   Annual Exam    Christy Bennett is a 67 y.o. female who presents today for a complete physical exam. She reports consuming a general diet. Home exercise routine includes working in yard, stationary bike, tracking steps every day. She generally feels fairly well. She reports sleeping well. She does not have additional problems to discuss today.    Most recent fall risk assessment:    01/04/2023    1:14 PM  Fall Risk   Falls in the past year? 1  Number falls in past yr: 0  Injury with Fall? 0  Risk for fall due to : No Fall Risks  Follow up Falls evaluation completed     Most recent depression screenings:    01/04/2023    1:14 PM 10/20/2022    2:33 PM  PHQ 2/9 Scores  PHQ - 2 Score 0 1  PHQ- 9 Score 0 1    Vision:Within last year and Dental: Current dental problems and Receives regular dental care  Past Medical History:  Diagnosis Date   Allergy    Anxiety    Clotting disorder (HCC)    Im on a blood thinner and bruise easily   Complication of anesthesia    difficulty waking up after anesthesia   COPD (chronic obstructive pulmonary disease) (HCC)    Depression    Emphysema of lung (HCC)    Encounter for well adult exam with abnormal findings 01/04/2023   GERD (gastroesophageal reflux disease)    Hyperlipemia    Hypertension    Hypothyroid    Lung cancer (HCC) 2009   Neuromuscular disorder (HCC)    Osteoporosis    PVD (peripheral vascular disease) (HCC)    RA (rheumatoid arthritis) (HCC)    Tremors of nervous system    Past Surgical History:  Procedure Laterality Date   BIOPSY  11/22/2022   Procedure: BIOPSY;  Surgeon: Dolores Frame, MD;  Location: [facility];  Service: Gastroenterology;;   BREAST LUMPECTOMY Left 04/23/2019   BREAST LUMPECTOMY WITH RADIOACTIVE SEED AND SENTINEL LYMPH NODE  BIOPSY Left 04/23/2019   Procedure: LEFT BREAST LUMPECTOMY WITH RADIOACTIVE SEED AND LEFT AXILLARY SENTINEL LYMPH NODE BIOPSY;  Surgeon: Emelia Loron, MD;  Location: [facility];  Service: General;  Laterality: Left;   CATARACT EXTRACTION, BILATERAL     ESOPHAGEAL DILATION Left 11/22/2022   Procedure: ESOPHAGEAL DILATION;  Surgeon: Dolores Frame, MD;  Location: [facility];  Service: Gastroenterology;  Laterality: Left;  11:15am;asa 3   ESOPHAGOGASTRODUODENOSCOPY (EGD) WITH PROPOFOL N/A 11/22/2022   Procedure: ESOPHAGOGASTRODUODENOSCOPY (EGD) WITH PROPOFOL;  Surgeon: Dolores Frame, MD;  Location: [facility];  Service: Gastroenterology;  Laterality: N/A;  11:15am;asa 3   EYE SURGERY     Cataract surgery x2   FEMORAL BYPASS Right 2017   x3   KIDNEY STONE SURGERY     Social History   Tobacco Use   Smoking status: Former    Current packs/day: 0.00    Average packs/day: 1 pack/day for 43.0 years (43.0 ttl pk-yrs)    Types: Cigarettes    Start date: 04/11/1972    Quit date: 04/12/2015    Years since quitting: 7.7    Passive exposure: Past   Smokeless tobacco: Never  Substance Use Topics   Alcohol use: Not Currently   Drug  use: Never   Family History  Problem Relation Age of Onset   Diabetes Mother    Hyperlipidemia Mother    Arthritis Mother    Heart disease Mother    Hypertension Mother    Kidney disease Mother    Lung cancer Maternal Aunt    Allergies  Allergen Reactions   Amoxicillin-Pot Clavulanate Diarrhea, Nausea And Vomiting and Nausea Only    abdominal pain      Patient Care Team: Billie Lade, MD as PCP - General (Internal Medicine) Doreatha Massed, MD as Medical Oncologist (Medical Oncology)   Outpatient Medications Prior to Visit  Medication Sig   albuterol (PROAIR HFA) 108 (90 Base) MCG/ACT inhaler Inhale 2 puffs into the lungs every 4 (four) hours as needed for shortness of breath or wheezing.    anastrozole (ARIMIDEX) 1 MG tablet Take 1 tablet by mouth once daily   arformoterol (BROVANA) 15 MCG/2ML NEBU Take 2 mLs (15 mcg total) by nebulization in the morning and at bedtime.   ascorbic acid (VITAMIN C) 500 MG tablet Take 500 mg by mouth daily.   aspirin EC 81 MG tablet Take 81 mg by mouth daily.   b complex vitamins capsule Take 1 capsule by mouth daily.   baclofen (LIORESAL) 20 MG tablet Take 20 mg by mouth 3 (three) times daily as needed for muscle spasms.   buPROPion (WELLBUTRIN SR) 150 MG 12 hr tablet Take 1 tablet (150 mg total) by mouth 2 (two) times daily.   Calcium Carb-Cholecalciferol (CALCIUM 600/VITAMIN D PO) Take 1 tablet by mouth in the morning and at bedtime.   cetirizine (ZYRTEC) 10 MG tablet Take 10 mg by mouth daily.   clopidogrel (PLAVIX) 75 MG tablet Take 1 tablet (75 mg total) by mouth daily.   denosumab (PROLIA) 60 MG/ML SOSY injection Inject 60 mg into the skin every 6 (six) months.   diclofenac Sodium (VOLTAREN) 1 % GEL Apply 2 g topically 3 (three) times daily as needed (pain).   docusate sodium (COLACE) 50 MG capsule Take 50 mg by mouth as needed for mild constipation.   esomeprazole (NEXIUM) 20 MG capsule Take 2 capsules (40 mg total) by mouth daily at 12 noon AND 1 capsule (20 mg total) at bedtime.   hydroxychloroquine (PLAQUENIL) 200 MG tablet Take 300 mg by mouth daily.   hydroxypropyl methylcellulose / hypromellose (ISOPTO TEARS / GONIOVISC) 2.5 % ophthalmic solution Place 1 drop into both eyes as needed for dry eyes.   ibuprofen (ADVIL) 600 MG tablet Take 600 mg by mouth every 6 (six) hours as needed for moderate pain.   leflunomide (ARAVA) 20 MG tablet Take 20 mg by mouth daily.   levothyroxine (SYNTHROID) 137 MCG tablet Take 1 tablet (137 mcg total) by mouth daily.   losartan (COZAAR) 25 MG tablet Take 1 tablet (25 mg total) by mouth daily.   meclizine (ANTIVERT) 25 MG tablet Take 25 mg by mouth 3 (three) times daily as needed for dizziness.    revefenacin (YUPELRI) 175 MCG/3ML nebulizer solution Take 175 mcg by nebulization daily.   simvastatin (ZOCOR) 10 MG tablet Take 10 mg by mouth daily.   sulfaSALAzine (AZULFIDINE) 500 MG tablet Take 500-1,000 mg by mouth See admin instructions. Taking 500 mg by mouth in the morning and 1000 mg in the evening   traZODone (DESYREL) 100 MG tablet Take 1 tablet (100 mg total) by mouth at bedtime.   verapamil (CALAN-SR) 240 MG CR tablet Take 1 tablet (240 mg total) by mouth  daily.   Vitamin E 268 MG (400 UNIT) TABS Take 400 Units by mouth daily.   [DISCONTINUED] valACYclovir (VALTREX) 500 MG tablet Take 2,000 mg by mouth 2 (two) times daily as needed (fever blisters).   No facility-administered medications prior to visit.   Review of Systems  Constitutional:  Negative for chills and fever.  HENT:  Negative for sore throat.   Respiratory:  Negative for cough and shortness of breath.   Cardiovascular:  Negative for chest pain, palpitations and leg swelling.  Gastrointestinal:  Negative for abdominal pain, blood in stool, constipation, diarrhea, nausea and vomiting.  Genitourinary:  Negative for dysuria and hematuria.  Musculoskeletal:  Negative for myalgias.  Skin:  Negative for itching and rash.  Neurological:  Negative for dizziness and headaches.  Psychiatric/Behavioral:  Negative for depression and suicidal ideas.       Objective:     BP 115/79   Pulse 84   Ht 5\' 2"  (1.575 m)   Wt 121 lb 3.2 oz (55 kg)   SpO2 93%   BMI 22.17 kg/m  BP Readings from Last 3 Encounters:  01/04/23 115/79  11/22/22 120/70  11/18/22 (!) 182/64   Physical Exam Vitals reviewed.  Constitutional:      General: She is not in acute distress.    Appearance: Normal appearance. She is not toxic-appearing.  HENT:     Head: Normocephalic and atraumatic.     Right Ear: External ear normal.     Left Ear: External ear normal.     Nose: Nose normal. No congestion or rhinorrhea.     Mouth/Throat:     Mouth:  Mucous membranes are moist.     Pharynx: Oropharynx is clear. No oropharyngeal exudate or posterior oropharyngeal erythema.  Eyes:     General: No scleral icterus.    Extraocular Movements: Extraocular movements intact.     Conjunctiva/sclera: Conjunctivae normal.     Pupils: Pupils are equal, round, and reactive to light.  Cardiovascular:     Rate and Rhythm: Normal rate and regular rhythm.     Pulses: Normal pulses.     Heart sounds: Murmur heard.     No friction rub. No gallop.  Pulmonary:     Effort: Pulmonary effort is normal.     Breath sounds: Normal breath sounds. No wheezing, rhonchi or rales.  Abdominal:     General: Abdomen is flat. Bowel sounds are normal. There is no distension.     Palpations: Abdomen is soft.     Tenderness: There is no abdominal tenderness.  Musculoskeletal:        General: Deformity (Bunion of left foot with significant lateral deviation of first digit of left foot) present. No swelling. Normal range of motion.     Cervical back: Normal range of motion.     Right lower leg: No edema.     Left lower leg: No edema.  Lymphadenopathy:     Cervical: No cervical adenopathy.  Skin:    General: Skin is warm and dry.     Capillary Refill: Capillary refill takes less than 2 seconds.     Coloration: Skin is not jaundiced.  Neurological:     General: No focal deficit present.     Mental Status: She is alert and oriented to person, place, and time.  Psychiatric:        Mood and Affect: Mood normal.        Behavior: Behavior normal.      Last CBC Lab Results  Component Value Date   WBC 4.6 09/08/2022   HGB 11.0 (L) 09/08/2022   HCT 34.1 (L) 09/08/2022   MCV 99.1 09/08/2022   MCH 32.0 09/08/2022   RDW 14.0 09/08/2022   PLT 220 09/08/2022   Last metabolic panel Lab Results  Component Value Date   GLUCOSE 85 09/08/2022   NA 137 09/08/2022   K 3.8 09/08/2022   CL 102 09/08/2022   CO2 26 09/08/2022   BUN 24 (H) 09/08/2022   CREATININE 0.80  09/08/2022   GFRNONAA >60 09/08/2022   CALCIUM 9.3 09/08/2022   PROT 4.8 (L) 09/08/2022   ALBUMIN 4.0 09/08/2022   BILITOT 0.7 09/08/2022   ALKPHOS 67 09/08/2022   AST 25 09/08/2022   ALT 25 09/08/2022   ANIONGAP 9 09/08/2022   Last vitamin D Lab Results  Component Value Date   VD25OH 64.24 09/08/2022       Assessment & Plan:    Routine Health Maintenance and Physical Exam  Immunization History  Administered Date(s) Administered   Fluad Quad(high Dose 65+) 02/20/2022   Influenza Split 03/20/2009, 03/03/2016, 02/10/2017, 02/21/2019   Influenza Whole 06/25/2010, 03/29/2011, 02/21/2012   Influenza, Seasonal, Injecte, Preservative Fre 06/25/2010, 03/29/2011, 02/21/2012, 03/15/2012, 01/28/2013, 03/19/2015, 02/06/2019   Influenza,inj,Quad PF,6+ Mos 01/28/2013, 02/14/2017, 02/20/2018, 03/03/2020, 02/18/2021   Influenza-Unspecified 02/21/2007, 02/21/2007   Moderna Sars-Covid-2 Vaccination 07/23/2019, 08/20/2019, 04/15/2020, 10/14/2020   Pneumococcal Conjugate PCV 7 01/30/2008   Pneumococcal Conjugate,unspecified 01/30/2008   Pneumococcal Conjugate-13 05/29/2013, 08/12/2015   Pneumococcal Polysaccharide-23 01/30/2008, 03/18/2015, 06/01/2020   Tdap 02/21/2012, 02/27/2016   Zoster Recombinant(Shingrix) 08/18/2020    Health Maintenance  Topic Date Due   Hepatitis C Screening  Never done   Zoster Vaccines- Shingrix (2 of 2) 10/13/2020   COVID-19 Vaccine (5 - 2023-24 season) 01/21/2022   INFLUENZA VACCINE  12/22/2022   Medicare Annual Wellness (AWV)  10/20/2023   MAMMOGRAM  03/08/2024   DTaP/Tdap/Td (3 - Td or Tdap) 02/26/2026   Colonoscopy  11/03/2030   Pneumonia Vaccine 77+ Years old  Completed   DEXA SCAN  Completed   HPV VACCINES  Aged Out   Lung Cancer Screening  Discontinued    Discussed health benefits of physical activity, and encouraged her to engage in regular exercise appropriate for her age and condition.  Problem List Items Addressed This Visit     Encounter  for well adult exam with abnormal findings    Annual exam completed today.  Available records and labs have been reviewed. -Repeat labs have been ordered today, including one-time HCV screening -Vaccinations are largely up-to-date.  I have recommended that she receive her influenza vaccine this fall. -We will tentatively plan for follow-up in 6 months for routine care.      Return in about 6 months (around 07/07/2023).  Billie Lade, MD

## 2023-01-04 NOTE — Assessment & Plan Note (Signed)
Annual exam completed today.  Available records and labs have been reviewed. -Repeat labs have been ordered today, including one-time HCV screening -Vaccinations are largely up-to-date.  I have recommended that she receive her influenza vaccine this fall. -We will tentatively plan for follow-up in 6 months for routine care.

## 2023-01-06 LAB — HCV AB W REFLEX TO QUANT PCR: HCV Ab: NONREACTIVE

## 2023-01-06 LAB — CMP14+EGFR
ALT: 19 IU/L (ref 0–32)
AST: 19 IU/L (ref 0–40)
Albumin: 4.4 g/dL (ref 3.9–4.9)
Alkaline Phosphatase: 84 IU/L (ref 44–121)
BUN/Creatinine Ratio: 19 (ref 12–28)
BUN: 13 mg/dL (ref 8–27)
Bilirubin Total: 0.7 mg/dL (ref 0.0–1.2)
CO2: 22 mmol/L (ref 20–29)
Calcium: 9.1 mg/dL (ref 8.7–10.3)
Chloride: 104 mmol/L (ref 96–106)
Creatinine, Ser: 0.67 mg/dL (ref 0.57–1.00)
Globulin, Total: 1.5 g/dL (ref 1.5–4.5)
Glucose: 68 mg/dL — ABNORMAL LOW (ref 70–99)
Potassium: 4.1 mmol/L (ref 3.5–5.2)
Sodium: 140 mmol/L (ref 134–144)
Total Protein: 5.9 g/dL — ABNORMAL LOW (ref 6.0–8.5)
eGFR: 95 mL/min/{1.73_m2} (ref >59)

## 2023-01-06 LAB — HCV INTERPRETATION

## 2023-01-06 LAB — CBC WITH DIFFERENTIAL/PLATELET
Basophils Absolute: 0.1 10*3/uL (ref 0.0–0.2)
Basos: 1 %
EOS (ABSOLUTE): 0.2 10*3/uL (ref 0.0–0.4)
Eos: 4 %
Hematocrit: 32.9 % — ABNORMAL LOW (ref 34.0–46.6)
Hemoglobin: 10.7 g/dL — ABNORMAL LOW (ref 11.1–15.9)
Immature Grans (Abs): 0 10*3/uL (ref 0.0–0.1)
Immature Granulocytes: 0 %
Lymphocytes Absolute: 1 10*3/uL (ref 0.7–3.1)
Lymphs: 17 %
MCH: 30.8 pg (ref 26.6–33.0)
MCHC: 32.5 g/dL (ref 31.5–35.7)
MCV: 95 fL (ref 79–97)
Monocytes Absolute: 0.7 10*3/uL (ref 0.1–0.9)
Monocytes: 11 %
Neutrophils Absolute: 4 10*3/uL (ref 1.4–7.0)
Neutrophils: 67 %
Platelets: 273 10*3/uL (ref 150–450)
RBC: 3.47 x10E6/uL — ABNORMAL LOW (ref 3.77–5.28)
RDW: 13.2 % (ref 11.7–15.4)
WBC: 6 10*3/uL (ref 3.4–10.8)

## 2023-01-06 LAB — LIPID PANEL
Chol/HDL Ratio: 2.4 ratio (ref 0.0–4.4)
Cholesterol, Total: 172 mg/dL (ref 100–199)
HDL: 73 mg/dL (ref >39)
LDL Chol Calc (NIH): 87 mg/dL (ref 0–99)
Triglycerides: 63 mg/dL (ref 0–149)
VLDL Cholesterol Cal: 12 mg/dL (ref 5–40)

## 2023-01-06 LAB — VITAMIN D 25 HYDROXY (VIT D DEFICIENCY, FRACTURES): Vit D, 25-Hydroxy: 62.3 ng/mL (ref 30.0–100.0)

## 2023-01-06 LAB — HEMOGLOBIN A1C
Est. average glucose Bld gHb Est-mCnc: <74 mg/dL
Hgb A1c MFr Bld: <4.2 % — ABNORMAL LOW (ref 4.8–5.6)

## 2023-01-06 LAB — B12 AND FOLATE PANEL
Folate: 18.1 ng/mL (ref >3.0)
Vitamin B-12: 1120 pg/mL (ref 232–1245)

## 2023-01-06 LAB — TSH+FREE T4
Free T4: 2.09 ng/dL — ABNORMAL HIGH (ref 0.82–1.77)
TSH: 0.601 u[IU]/mL (ref 0.450–4.500)

## 2023-01-23 ENCOUNTER — Other Ambulatory Visit: Payer: Self-pay | Admitting: Internal Medicine

## 2023-01-26 ENCOUNTER — Ambulatory Visit (INDEPENDENT_AMBULATORY_CARE_PROVIDER_SITE_OTHER): Payer: Medicare Other | Admitting: Gastroenterology

## 2023-01-26 ENCOUNTER — Encounter (INDEPENDENT_AMBULATORY_CARE_PROVIDER_SITE_OTHER): Payer: Self-pay | Admitting: Gastroenterology

## 2023-01-26 VITALS — BP 160/79 | HR 66 | Temp 97.5°F | Ht 62.0 in | Wt 121.5 lb

## 2023-01-26 DIAGNOSIS — K2100 Gastro-esophageal reflux disease with esophagitis, without bleeding: Secondary | ICD-10-CM | POA: Diagnosis not present

## 2023-01-26 MED ORDER — ESOMEPRAZOLE MAGNESIUM 20 MG PO CPDR
DELAYED_RELEASE_CAPSULE | ORAL | 3 refills | Status: DC
Start: 2023-01-26 — End: 2023-09-19

## 2023-01-26 NOTE — Patient Instructions (Signed)
Please continue with nexium 40mg  in the morning and 20mg  in the evening Be mindful of greasy, spicy, fried, citrus foods, caffeine, carbonated drinks, chocolate and alcohol as these can increase reflux symptoms Stay upright 2-3 hours after eating, prior to lying down and avoid eating late in the evenings.  Follow up 6 months  It was a pleasure to see you today. I want to create trusting relationships with patients and provide genuine, compassionate, and quality care. I truly value your feedback! please be on the lookout for a survey regarding your visit with me today. I appreciate your input about our visit and your time in completing this!    Christy Bennett L. Christy Hubert, MSN, APRN, AGNP-C Adult-Gerontology Nurse Practitioner [facility]

## 2023-01-26 NOTE — Progress Notes (Addendum)
Referring Provider: Billie Lade, MD Primary Care Physician:  Billie Lade, MD Primary GI Physician: Dr. Levon Hedger   Chief Complaint  Patient presents with   Follow-up    Patient here today for a follow up appointment. Patient says she is not having any issues with dysphagia. She is taking Esomeprazole 60 mg total per day. May be interested in the TIF.   HPI:   Christy Bennett is a 67 y.o. female with past medical history of  stage III NSCLC of the RUL s/p chemotherapy and radiation, stage I invasive lobular carcinoma of the left breast s/p lumpectomy and sentinel lymph node biopsy in December 2020, PAD s/p femoropopliteal bypass x 4 of the right lower extremity, hypothyroidism, HTN, HLD, rheumatoid arthritis, COPD, GERD, osteoporosis, insomnia, and generalized anxiety disorder.   Patient presenting today for follow up of dysphagia and GERD  Last seen may 2024, at that time having dysphagia, last EGD in 2022 taking nexium 40mg  daily with good control.   Recommended continue nexium 40mg  daily, schedule EGD  Present:  She is doing nexium 60mg  in the morning, was unaware she needed to split dose to 40mg  and 20mg  in morning and evening. She has no heartburn or acid regurgitation. Had some hoarseness previously but improved with increase in nexium dosing. Dysphagia has resolved. She is able to eat whatever she wants a this time.   No red flag symptoms. Patient denies melena, hematochezia, nausea, vomiting, diarrhea, constipation, dysphagia, odyonophagia, early satiety or weight loss.   Last Colonoscopy: 10/2020 years ago per [facility] in [place], no polyps  Last Endoscopy:11/2022 Benign-appearing esophageal stenosis. Biopsied.( Esophageal squamous and cardiac mucosa with mild chronic nonspecific carditis) - Negative for intestinal metaplasia or dysplasia                           Dilated.                           - 1 cm hiatal hernia.                           - Normal  stomach.                           - Normal examined duodenum.  Recommendations:    Past Medical History:  Diagnosis Date   Allergy    Anxiety    Clotting disorder (HCC)    Im on a blood thinner and bruise easily   Complication of anesthesia    difficulty waking up after anesthesia   COPD (chronic obstructive pulmonary disease) (HCC)    Depression    Emphysema of lung (HCC)    Encounter for well adult exam with abnormal findings 01/04/2023   GERD (gastroesophageal reflux disease)    Hyperlipemia    Hypertension    Hypothyroid    Lung cancer (HCC) 2009   Neuromuscular disorder (HCC)    Osteoporosis    PVD (peripheral vascular disease) (HCC)    RA (rheumatoid arthritis) (HCC)    Tremors of nervous system     Past Surgical History:  Procedure Laterality Date   BIOPSY  11/22/2022   Procedure: BIOPSY;  Surgeon: Dolores Frame, MD;  [facility];  Service: Gastroenterology;;   BREAST LUMPECTOMY Left 04/23/2019   BREAST LUMPECTOMY WITH RADIOACTIVE SEED AND SENTINEL LYMPH  NODE BIOPSY Left 04/23/2019   Procedure: LEFT BREAST LUMPECTOMY WITH RADIOACTIVE SEED AND LEFT AXILLARY SENTINEL LYMPH NODE BIOPSY;  Surgeon: Emelia Loron, MD;  Location: [facility];  Service: General;  Laterality: Left;   CATARACT EXTRACTION, BILATERAL     ESOPHAGEAL DILATION Left 11/22/2022   Procedure: ESOPHAGEAL DILATION;  Surgeon: Dolores Frame, MD;  [facility]: [facility];  Service: Gastroenterology;  Laterality: Left;  11:15am;asa 3   ESOPHAGOGASTRODUODENOSCOPY (EGD) WITH PROPOFOL N/A 11/22/2022   Procedure: ESOPHAGOGASTRODUODENOSCOPY (EGD) WITH PROPOFOL;  Surgeon: Dolores Frame, MD;  [facility];  Service: Gastroenterology;  Laterality: N/A;  11:15am;asa 3   EYE SURGERY     Cataract surgery x2   FEMORAL BYPASS Right 2017   x3   KIDNEY STONE SURGERY      Current Outpatient Medications  Medication Sig Dispense Refill   albuterol  (PROAIR HFA) 108 (90 Base) MCG/ACT inhaler Inhale 2 puffs into the lungs every 4 (four) hours as needed for shortness of breath or wheezing.     anastrozole (ARIMIDEX) 1 MG tablet Take 1 tablet by mouth once daily 90 tablet 1   arformoterol (BROVANA) 15 MCG/2ML NEBU Take 2 mLs (15 mcg total) by nebulization in the morning and at bedtime. 120 mL 4   ascorbic acid (VITAMIN C) 500 MG tablet Take 500 mg by mouth daily.     aspirin EC 81 MG tablet Take 81 mg by mouth daily.     b complex vitamins capsule Take 1 capsule by mouth daily.     baclofen (LIORESAL) 20 MG tablet Take 20 mg by mouth 3 (three) times daily as needed for muscle spasms.     buPROPion (WELLBUTRIN SR) 150 MG 12 hr tablet Take 1 tablet (150 mg total) by mouth 2 (two) times daily. 180 tablet 0   Calcium Carb-Cholecalciferol (CALCIUM 600/VITAMIN D PO) Take 1 tablet by mouth in the morning and at bedtime.     cetirizine (ZYRTEC) 10 MG tablet Take 10 mg by mouth daily.     clopidogrel (PLAVIX) 75 MG tablet Take 1 tablet (75 mg total) by mouth daily. 90 tablet 0   denosumab (PROLIA) 60 MG/ML SOSY injection Inject 60 mg into the skin every 6 (six) months.     diclofenac Sodium (VOLTAREN) 1 % GEL Apply 2 g topically 3 (three) times daily as needed (pain).     docusate sodium (COLACE) 50 MG capsule Take 50 mg by mouth as needed for mild constipation.     esomeprazole (NEXIUM) 20 MG capsule Take 2 capsules (40 mg total) by mouth daily at 12 noon AND 1 capsule (20 mg total) at bedtime. 270 capsule 3   hydroxychloroquine (PLAQUENIL) 200 MG tablet Take 300 mg by mouth daily.     hydroxypropyl methylcellulose / hypromellose (ISOPTO TEARS / GONIOVISC) 2.5 % ophthalmic solution Place 1 drop into both eyes as needed for dry eyes.     ibuprofen (ADVIL) 600 MG tablet Take 600 mg by mouth every 6 (six) hours as needed for moderate pain.     leflunomide (ARAVA) 20 MG tablet Take 20 mg by mouth daily.     levothyroxine (SYNTHROID) 137 MCG tablet Take 1  tablet by mouth once daily 90 tablet 0   losartan (COZAAR) 25 MG tablet Take 1 tablet (25 mg total) by mouth daily. 90 tablet 3   meclizine (ANTIVERT) 25 MG tablet Take 25 mg by mouth 3 (three) times daily as needed for dizziness.  revefenacin (YUPELRI) 175 MCG/3ML nebulizer solution Take 175 mcg by nebulization daily.     simvastatin (ZOCOR) 10 MG tablet Take 10 mg by mouth daily.     sulfaSALAzine (AZULFIDINE) 500 MG tablet Take 500-1,000 mg by mouth See admin instructions. Taking 500 mg by mouth in the morning and 1000 mg in the evening     traZODone (DESYREL) 100 MG tablet Take 1 tablet (100 mg total) by mouth at bedtime. 90 tablet 0   valACYclovir (VALTREX) 500 MG tablet Take 4 tablets (2,000 mg total) by mouth 2 (two) times daily as needed (fever blisters). 20 tablet 1   verapamil (CALAN-SR) 240 MG CR tablet Take 1 tablet (240 mg total) by mouth daily. 90 tablet 3   Vitamin E 268 MG (400 UNIT) TABS Take 400 Units by mouth daily.     No current facility-administered medications for this visit.    Allergies as of 01/26/2023 - Review Complete 01/04/2023  Allergen Reaction Noted   Amoxicillin-pot clavulanate Diarrhea, Nausea And Vomiting, and Nausea Only 09/02/2016    Family History  Problem Relation Age of Onset   Diabetes Mother    Hyperlipidemia Mother    Arthritis Mother    Heart disease Mother    Hypertension Mother    Kidney disease Mother    Lung cancer Maternal Aunt     Social History   Socioeconomic History   Marital status: Married    Spouse name: Tinnie Gens   Number of children: Not on file   Years of education: Not on file   Highest education level: Associate degree: occupational, Scientist, product/process development, or vocational program  Occupational History   Occupation: retires  Tobacco Use   Smoking status: Former    Current packs/day: 0.00    Average packs/day: 1 pack/day for 43.0 years (43.0 ttl pk-yrs)    Types: Cigarettes    Start date: 04/11/1972    Quit date: 04/12/2015     Years since quitting: 7.7    Passive exposure: Past   Smokeless tobacco: Never  Substance and Sexual Activity   Alcohol use: Not Currently   Drug use: Never   Sexual activity: Yes    Birth control/protection: Post-menopausal  Other Topics Concern   Not on file  Social History Narrative   Lives with husband.   Social Determinants of Health   Financial Resource Strain: Low Risk  (01/03/2023)   Overall Financial Resource Strain (CARDIA)    Difficulty of Paying Living Expenses: Not very hard  Food Insecurity: No Food Insecurity (01/03/2023)   Hunger Vital Sign    Worried About Running Out of Food in the Last Year: Never true    Ran Out of Food in the Last Year: Never true  Transportation Needs: No Transportation Needs (01/03/2023)   PRAPARE - Administrator, Civil Service (Medical): No    Lack of Transportation (Non-Medical): No  Physical Activity: Insufficiently Active (01/03/2023)   Exercise Vital Sign    Days of Exercise per Week: 7 days    Minutes of Exercise per Session: 20 min  Stress: No Stress Concern Present (01/03/2023)   [employer] of Occupational Health - Occupational Stress Questionnaire    Feeling of Stress : Only a little  Social Connections: Socially Integrated (01/03/2023)   Social Connection and Isolation Panel [NHANES]    Frequency of Communication with Friends and Family: More than three times a week    Frequency of Social Gatherings with Friends and Family: Once a week    Attends  Religious Services: More than 4 times per year    Active Member of Clubs or Organizations: Yes    Attends Banker Meetings: More than 4 times per year    Marital Status: Married    Review of systems General: negative for malaise, night sweats, fever, chills, weight loss Neck: Negative for lumps, goiter, pain and significant neck swelling Resp: Negative for cough, wheezing, dyspnea at rest CV: Negative for chest pain, leg swelling, palpitations,  orthopnea GI: denies melena, hematochezia, nausea, vomiting, diarrhea, constipation, dysphagia, odyonophagia, early satiety or unintentional weight loss.  MSK: Negative for joint pain or swelling, back pain, and muscle pain. Derm: Negative for itching or rash Psych: Denies depression, anxiety, memory loss, confusion. No homicidal or suicidal ideation.  Heme: Negative for prolonged bleeding, bruising easily, and swollen nodes. Endocrine: Negative for cold or heat intolerance, polyuria, polydipsia and goiter. Neuro: negative for tremor, gait imbalance, syncope and seizures. The remainder of the review of systems is noncontributory.  Physical Exam: BP (!) 165/78 (BP Location: Left Arm, Patient Position: Sitting, Cuff Size: Normal)   Pulse 98   Temp (!) 97.5 F (36.4 C) (Temporal)   Ht 5\' 2"  (1.575 m)   Wt 121 lb 8 oz (55.1 kg)   BMI 22.22 kg/m  General:   Alert and oriented. No distress noted. Pleasant and cooperative.  Head:  Normocephalic and atraumatic. Eyes:  Conjuctiva clear without scleral icterus. Mouth:  Oral mucosa pink and moist. Good dentition. No lesions. Heart: Normal rate and rhythm, s1 and s2 heart sounds present.  Lungs: Clear lung sounds in all lobes. Respirations equal and unlabored. Abdomen:  +BS, soft, non-tender and non-distended. No rebound or guarding. No HSM or masses noted. Derm: No palmar erythema or jaundice Msk:  Symmetrical without gross deformities. Normal posture. Extremities:  Without edema. Neurologic:  Alert and  oriented x4 Psych:  Alert and cooperative. Normal mood and affect.  Invalid input(s): "6 MONTHS"   ASSESSMENT: Shalonna Wiedel is a 67 y.o. female presenting today for follow up of dysphagia and GERD  GERD/Dysphagia: GERD well managed, she increased nexium after EGD as recommended to 60mg  total per day though misunderstood to divide doses to 40mg /20mg . This is working well. No breakthrough. No hoarseness or sore throat. Recommended patient  take 40mg  nexium in the morning and 20mg  at night. Should implement good reflux precautions. She had some concerns about higher dosing of PPI, however, Patient was re-assured regarding PPI use and safety of when appropriate indications in question. Most recent studies on PPI therapy that association of symptoms is not equivalent to causation and overall association with for example osteoporosis is weak and based on observational studies. When PPI use is indicated, it is safe to proceed with therapy and titrate dosing/use based on symptom response. She is possibly interested in TIF which I discussed with her briefly, will provide a brochure and she will reach out if she would like to discuss further with Dr. Levon Hedger.   PLAN:  Continue with nexium 40mg  in the morning/20mg  in the evening  2. Reflux precautions  3.  TIF brochure/pt to let us know if she would like to discuss further   All questions were answered, patient verbalized understanding and is in agreement with plan as outlined above.   Follow Up: 3 months   Derreon Consalvo L. Jeanmarie Hubert, MSN, APRN, AGNP-C Adult-Gerontology Nurse Practitioner [facility]  I have reviewed the note and agree with the APP's assessment as described in this progress  note  Katrinka Blazing, MD Gastroenterology and Hepatology [facility]

## 2023-01-27 ENCOUNTER — Other Ambulatory Visit: Payer: Self-pay | Admitting: Internal Medicine

## 2023-01-27 MED ORDER — SIMVASTATIN 10 MG PO TABS: 10.0000 mg | ORAL_TABLET | Freq: Every day | ORAL | 0 refills | Status: DC

## 2023-02-03 ENCOUNTER — Other Ambulatory Visit: Payer: Self-pay | Admitting: Internal Medicine

## 2023-02-06 ENCOUNTER — Encounter: Payer: Self-pay | Admitting: Internal Medicine

## 2023-02-06 ENCOUNTER — Other Ambulatory Visit: Payer: Self-pay

## 2023-02-06 MED ORDER — TRAZODONE HCL 100 MG PO TABS: 100.0000 mg | ORAL_TABLET | Freq: Every day | ORAL | 0 refills | Status: DC

## 2023-02-13 ENCOUNTER — Other Ambulatory Visit: Payer: Self-pay | Admitting: Internal Medicine

## 2023-03-08 ENCOUNTER — Telehealth: Payer: Self-pay | Admitting: Internal Medicine

## 2023-03-08 NOTE — Telephone Encounter (Signed)
Answering Service:  Patient needs a refill for albuterol sulfate sent to pharmacy.

## 2023-03-09 MED ORDER — ALBUTEROL SULFATE HFA 108 (90 BASE) MCG/ACT IN AERS: 2.0000 | INHALATION_SPRAY | RESPIRATORY_TRACT | 1 refills | Status: DC | PRN

## 2023-03-09 NOTE — Telephone Encounter (Signed)
Rx sent to pharmacy   

## 2023-03-12 NOTE — Progress Notes (Unsigned)
Christy Bennett, female    DOB: 21-Aug-1955   MRN: [id number]   Brief patient profile:  1 yowf quit smoking 2016  self referred to pulmonary clinic 07/28/2022  with doe/fatigue post covid       Dx with lung ca 2009 RT to Right  and chemo in [place] with scarring RUL on last cxrs from 2022    History of Present Illness  07/28/2022  Pulmonary/ 1st office eval/Leronda Lewers  Chief Complaint  Patient presents with   Consult    Self referral, sob with exertion, decreased energy, had Covid 1 mth. ago  Baseline= yardwork / back and forth to [place] of [place] stop twice walking up to a mile in 30 min slt inclines  then covid Jun 24 2022 fatigue, cough, st, green mucus from nose and after a few weeks main problem fatigue   Dyspnea:  back doing yardwork / hasn't tried [place]/  short of breath but not stopping at top of steps   Cough: some cough at hs min white no green/ some am mucus p few min Sleep: level bed with pillows. SABA use: on neb brovana/ yupelri  but some better p pred from [facility] Rec Try yupelri and brovana 1st thing in am and about 12 hours later use the brovana  Also  Ok to try albuterol 15 min before an activity (on alternating days)  that you know would usually make you short of breath    If not satisfied try Prednisone 10 mg 2 daily x 5 days, 1 daily x 5 days and stop   Please schedule a follow up office visit in 6 weeks, call sooner if needed in  [place] - bring inhaler    09/08/2022  f/u ov/[place] office/Deantae Shackleton re: doe/ fatigue post covid maint on lama/laba bid neb   Chief Complaint  Patient presents with   Follow-up    Pt f/u states that her breathing has been good recently    Dyspnea:  yard work is fine  Cough: minimal  Sleeping: level bed / 2 pillows SABA use: rarely  - doe not help prior to ex  02: none  Rec No change in medications except ok to leave off the evening nebulizer if doing great  Bring your PFTs with you to next office visit and your inhaler   Please schedule a follow up visit in 6  months but call sooner if needed - hopefully with PFTs same day     03/13/2023  f/u ov/[place] office/Jaedyn Lard re: *** maint on *** needs pfts***  No chief complaint on file.   Dyspnea:  *** Cough: *** Sleeping: ***   resp cc  SABA use: *** 02: ***  Lung cancer screening: ***   No obvious day to day or daytime variability or assoc excess/ purulent sputum or mucus plugs or hemoptysis or cp or chest tightness, subjective wheeze or overt sinus or hb symptoms.    Also denies any obvious fluctuation of symptoms with weather or environmental changes or other aggravating or alleviating factors except as outlined above   No unusual exposure hx or h/o childhood pna/ asthma or knowledge of premature birth.  Current Allergies, Complete Past Medical History, Past Surgical History, Family History, and Social History were reviewed in [employer] record.  ROS  The following are not active complaints unless bolded Hoarseness, sore throat, dysphagia, dental problems, itching, sneezing,  nasal congestion or discharge of excess mucus or purulent secretions, ear ache,  fever, chills, sweats, unintended wt loss or wt gain, classically pleuritic or exertional cp,  orthopnea pnd or arm/hand swelling  or leg swelling, presyncope, palpitations, abdominal pain, anorexia, nausea, vomiting, diarrhea  or change in bowel habits or change in bladder habits, change in stools or change in urine, dysuria, hematuria,  rash, arthralgias, visual complaints, headache, numbness, weakness or ataxia or problems with walking or coordination,  change in mood or  memory.        No outpatient medications have been marked as taking for the 03/13/23 encounter (Appointment) with Nyoka Cowden, MD.             Past Medical History:  Diagnosis Date   Allergy    COPD (chronic obstructive pulmonary disease) (HCC)    Depression    GERD (gastroesophageal reflux  disease)    Hyperlipemia    Hypertension    Hypothyroid    Lung cancer (HCC) 2009   Osteoporosis    PVD (peripheral vascular disease) (HCC)    RA (rheumatoid arthritis) (HCC)       Objective:    Wts  03/13/2023      ***  09/08/2022       121   07/28/22 118 lb 6.4 oz (53.7 kg)  03/15/22 125 lb 8 oz (56.9 kg)  09/16/21 121 lb 6.4 oz (55.1 kg)  Vital signs reviewed  03/13/2023  - Note at rest 02 sats  ***% on ***   General appearance:    ***    Min bar***       Assessment

## 2023-03-13 ENCOUNTER — Encounter (HOSPITAL_COMMUNITY): Payer: Self-pay

## 2023-03-13 ENCOUNTER — Ambulatory Visit (INDEPENDENT_AMBULATORY_CARE_PROVIDER_SITE_OTHER): Payer: Medicare Other | Admitting: Internal Medicine

## 2023-03-13 ENCOUNTER — Ambulatory Visit (HOSPITAL_COMMUNITY)
Admission: RE | Admit: 2023-03-13 | Discharge: 2023-03-13 | Disposition: A | Discharge status: Home / Self Care | Payer: Medicare Other | Source: Ambulatory Visit | Attending: Hematology | Admitting: Hematology

## 2023-03-13 ENCOUNTER — Inpatient Hospital Stay: Payer: Medicare Other | Attending: Hematology

## 2023-03-13 ENCOUNTER — Encounter: Payer: Self-pay | Admitting: Internal Medicine

## 2023-03-13 VITALS — BP 171/79 | HR 85 | Ht 62.0 in | Wt 119.0 lb

## 2023-03-13 DIAGNOSIS — Z79631 Long term (current) use of antimetabolite agent: Secondary | ICD-10-CM | POA: Diagnosis not present

## 2023-03-13 DIAGNOSIS — Z79811 Long term (current) use of aromatase inhibitors: Secondary | ICD-10-CM | POA: Insufficient documentation

## 2023-03-13 DIAGNOSIS — Z923 Personal history of irradiation: Secondary | ICD-10-CM | POA: Insufficient documentation

## 2023-03-13 DIAGNOSIS — M21619 Bunion of unspecified foot: Secondary | ICD-10-CM | POA: Insufficient documentation

## 2023-03-13 DIAGNOSIS — Z85118 Personal history of other malignant neoplasm of bronchus and lung: Secondary | ICD-10-CM | POA: Insufficient documentation

## 2023-03-13 DIAGNOSIS — C50912 Malignant neoplasm of unspecified site of left female breast: Secondary | ICD-10-CM | POA: Diagnosis present

## 2023-03-13 DIAGNOSIS — Z170 Estrogen receptor positive status [ER+]: Secondary | ICD-10-CM | POA: Diagnosis present

## 2023-03-13 DIAGNOSIS — Z1231 Encounter for screening mammogram for malignant neoplasm of breast: Secondary | ICD-10-CM | POA: Diagnosis present

## 2023-03-13 DIAGNOSIS — F419 Anxiety disorder, unspecified: Secondary | ICD-10-CM | POA: Insufficient documentation

## 2023-03-13 DIAGNOSIS — Z79899 Other long term (current) drug therapy: Secondary | ICD-10-CM | POA: Insufficient documentation

## 2023-03-13 DIAGNOSIS — F32A Depression, unspecified: Secondary | ICD-10-CM | POA: Insufficient documentation

## 2023-03-13 DIAGNOSIS — M810 Age-related osteoporosis without current pathological fracture: Secondary | ICD-10-CM | POA: Diagnosis not present

## 2023-03-13 DIAGNOSIS — E876 Hypokalemia: Secondary | ICD-10-CM | POA: Insufficient documentation

## 2023-03-13 DIAGNOSIS — J449 Chronic obstructive pulmonary disease, unspecified: Secondary | ICD-10-CM

## 2023-03-13 DIAGNOSIS — M069 Rheumatoid arthritis, unspecified: Secondary | ICD-10-CM | POA: Insufficient documentation

## 2023-03-13 DIAGNOSIS — Z720 Tobacco use: Secondary | ICD-10-CM | POA: Insufficient documentation

## 2023-03-13 DIAGNOSIS — C50412 Malignant neoplasm of upper-outer quadrant of left female breast: Secondary | ICD-10-CM | POA: Insufficient documentation

## 2023-03-13 LAB — COMPREHENSIVE METABOLIC PANEL
ALT: 27 U/L (ref 0–44)
AST: 24 U/L (ref 15–41)
Albumin: 3.9 g/dL (ref 3.5–5.0)
Alkaline Phosphatase: 86 U/L (ref 38–126)
Anion gap: 9 (ref 5–15)
BUN: 15 mg/dL (ref 8–23)
CO2: 25 mmol/L (ref 22–32)
Calcium: 8.9 mg/dL (ref 8.9–10.3)
Chloride: 104 mmol/L (ref 98–111)
Creatinine, Ser: 0.73 mg/dL (ref 0.44–1.00)
GFR, Estimated: >60 mL/min (ref >60)
Glucose, Bld: 124 mg/dL — ABNORMAL HIGH (ref 70–99)
Potassium: 3.5 mmol/L (ref 3.5–5.1)
Sodium: 138 mmol/L (ref 135–145)
Total Bilirubin: 0.6 mg/dL (ref 0.3–1.2)
Total Protein: 5.2 g/dL — ABNORMAL LOW (ref 6.5–8.1)

## 2023-03-13 LAB — CBC WITH DIFFERENTIAL/PLATELET
Abs Immature Granulocytes: 0.02 10*3/uL (ref 0.00–0.07)
Basophils Absolute: 0.1 10*3/uL (ref 0.0–0.1)
Basophils Relative: 2 %
Eosinophils Absolute: 0.2 10*3/uL (ref 0.0–0.5)
Eosinophils Relative: 4 %
HCT: 33.3 % — ABNORMAL LOW (ref 36.0–46.0)
Hemoglobin: 10.8 g/dL — ABNORMAL LOW (ref 12.0–15.0)
Immature Granulocytes: 0 %
Lymphocytes Relative: 21 %
Lymphs Abs: 1.1 10*3/uL (ref 0.7–4.0)
MCH: 32 pg (ref 26.0–34.0)
MCHC: 32.4 g/dL (ref 30.0–36.0)
MCV: 98.8 fL (ref 80.0–100.0)
Monocytes Absolute: 0.7 10*3/uL (ref 0.1–1.0)
Monocytes Relative: 14 %
Neutro Abs: 3 10*3/uL (ref 1.7–7.7)
Neutrophils Relative %: 59 %
Platelets: 244 10*3/uL (ref 150–400)
RBC: 3.37 MIL/uL — ABNORMAL LOW (ref 3.87–5.11)
RDW: 13.3 % (ref 11.5–15.5)
WBC: 5.1 10*3/uL (ref 4.0–10.5)
nRBC: 0 % (ref 0.0–0.2)

## 2023-03-13 LAB — MAGNESIUM: Magnesium: 1.8 mg/dL (ref 1.7–2.4)

## 2023-03-13 NOTE — Patient Instructions (Addendum)
Nexium should be Take 30- 60 min before your first and last meals of the day   GERD (REFLUX)  is an extremely common cause of respiratory symptoms just like yours , many times with no obvious heartburn at all.    It can be treated with medication, but also with lifestyle changes including elevation of the head of your bed (ideally with 6 -8inch blocks under the headboard of your bed),  Smoking cessation, avoidance of late meals, excessive alcohol, and avoid fatty foods, chocolate, peppermint, colas, red wine, and acidic juices such as orange juice.  NO MINT OR MENTHOL PRODUCTS SO NO COUGH DROPS  USE SUGARLESS CANDY INSTEAD (Jolley ranchers or Stover's or Life Savers) or even ice chips will also do - the key is to swallow to prevent all throat clearing. NO OIL BASED VITAMINS - use powdered substitutes.  Avoid fish oil when coughing.   For cough/ congestion >   Mucinex dm  up to maximum of  1200 mg every 12 hours as needed    Brovana 1st thing in am and chase it with yupelri and the pm brovana dose 12 hours later  is ok to leave off if doing great.    Please schedule a follow up visit in 12  months but call sooner if needed

## 2023-03-14 ENCOUNTER — Encounter: Payer: Self-pay | Admitting: Internal Medicine

## 2023-03-14 LAB — CANCER ANTIGEN 15-3: CA 15-3: 21.9 U/mL (ref 0.0–25.0)

## 2023-03-14 NOTE — Assessment & Plan Note (Addendum)
Quit smoking 2016 - PFT's  08/05/22  FEV1 1.38 (62 % ) ratio 0.63  p 8 % improvement from saba p ? prior to study with DLCO  13.6 (67%)   and FV curve min curved  and ERV 37%  at wt 138  - 07/28/2022   Walked on RA  x  3  lap(s) =  approx 750  ft  @ avg pace, stopped due to end of study with lowest 02 sats 96% s sob      Pt is Group B in terms of symptom/risk and laba/lama therefore appropriate rx at this point >>>  lama/laba approp if having limiting doe, which she is not at present and even doing steps s stopping so could wean these down to just brovana or yupelri as long as this continues to be issue and she is not having aecopd, in which either case needs to maintain on brovana / yupelri q am and brovana repeated 12 h later.  For the CB component > mucinex dm 1200 mg bid  F/u q 12 m, sooner if needed         Each maintenance medication was reviewed in detail including emphasizing most importantly the difference between maintenance and prns and under what circumstances the prns are to be triggered using an action plan format where appropriate.  Total time for H and P, chart review, counseling, reviewing neb device(s) and generating customized AVS unique to this office visit / same day charting = 32 min summary f/u ov

## 2023-03-21 ENCOUNTER — Inpatient Hospital Stay (HOSPITAL_BASED_OUTPATIENT_CLINIC_OR_DEPARTMENT_OTHER): Payer: Medicare Other | Admitting: Hematology

## 2023-03-21 ENCOUNTER — Inpatient Hospital Stay: Payer: Medicare Other

## 2023-03-21 VITALS — BP 136/74 | HR 76 | Temp 97.8°F | Resp 18 | Ht 62.0 in | Wt 118.8 lb

## 2023-03-21 DIAGNOSIS — C50912 Malignant neoplasm of unspecified site of left female breast: Secondary | ICD-10-CM | POA: Diagnosis not present

## 2023-03-21 DIAGNOSIS — C50412 Malignant neoplasm of upper-outer quadrant of left female breast: Secondary | ICD-10-CM

## 2023-03-21 DIAGNOSIS — E559 Vitamin D deficiency, unspecified: Secondary | ICD-10-CM

## 2023-03-21 DIAGNOSIS — Z170 Estrogen receptor positive status [ER+]: Secondary | ICD-10-CM

## 2023-03-21 LAB — IRON AND TIBC
Iron: 56 ug/dL (ref 28–170)
Saturation Ratios: 17 % (ref 10.4–31.8)
TIBC: 328 ug/dL (ref 250–450)
UIBC: 272 ug/dL

## 2023-03-21 LAB — FOLATE: Folate: 21.4 ng/mL (ref >5.9)

## 2023-03-21 LAB — VITAMIN B12: Vitamin B-12: 1076 pg/mL — ABNORMAL HIGH (ref 180–914)

## 2023-03-21 LAB — FERRITIN: Ferritin: 85 ng/mL (ref 11–307)

## 2023-03-21 NOTE — Patient Instructions (Addendum)
[facility]  Discharge Instructions  You were seen and examined today by Dr. Ellin Saba.  Dr. Ellin Saba discussed your most recent lab work and CT scan which revealed that everything looks good and stable.  Get the disk for your last CT scan from [place]. Dr. Ellin Saba is ordering further labs to be done today.   Follow-up as scheduled in 6 months.     Thank you for choosing [facility] to provide your oncology and hematology care.   To afford each patient quality time with our provider, please arrive at least 15 minutes before your scheduled appointment time. You may need to reschedule your appointment if you arrive late (10 or more minutes). Arriving late affects you and other patients whose appointments are after yours.  Also, if you miss three or more appointments without notifying the office, you may be dismissed from the clinic at the provider's discretion.    Again, thank you for choosing [facility].  Our hope is that these requests will decrease the amount of time that you wait before being seen by our physicians.   If you have a lab appointment with the Cancer Center - please note that after April 8th, all labs will be drawn in the cancer center.  You do not have to check in or register with the main entrance as you have in the past but will complete your check-in at the cancer center.            _____________________________________________________________  Should you have questions after your visit to [facility], please contact our office at [phone] and follow the prompts.  Our office hours are 8:00 a.m. to 4:30 p.m. Monday - Thursday and 8:00 a.m. to 2:30 p.m. Friday.  Please note that voicemails left after 4:00 p.m. may not be returned until the following business day.  We are closed weekends and all major holidays.  You do have access to a nurse 24-7, just call the main number to the  clinic [phone] and do not press any options, hold on the line and a nurse will answer the phone.    For prescription refill requests, have your pharmacy contact our office and allow 72 hours.    Masks are no longer required in the cancer centers. If you would like for your care team to wear a mask while they are taking care of you, please let them know. You may have one support person who is at least 69 years old accompany you for your appointments.

## 2023-03-21 NOTE — Progress Notes (Signed)
[facility] [address] [place] [address]    Clinic Day:  03/21/2023  Referring physician: Virl Diamond, MD  Patient Care Team: Billie Lade, MD as PCP - General (Internal Medicine) Doreatha Massed, MD as Medical Oncologist (Medical Oncology)   ASSESSMENT & PLAN:   Assessment: 1.  Stage I (T1CN0) invasive lobular carcinoma of the left breast: -Biopsy on 02/28/2019 [facility] consistent with invasive lobular carcinoma 12:30 position, grade 1, ER positive, PR negative and HER-2 negative by FISH. -Lumpectomy and sentinel lymph node biopsy on 04/23/2019 by Dr. Dwain Sarna shows grade 1, 1.5 cm invasive lobular carcinoma, margins negative, 1 sentinel lymph node negative, ER more than 90%, PR negative, HER-2 negative, Ki-67 not done.  PT1CPN0. -Anastrozole started around 05/14/2019. -She was seen by radiation oncology in [place].  As the absolute reduction in local recurrence was low at 3% with radiation, patient opted to not to undergo XRT.   2.  Stage III (T3 N2 M0) non-small cell lung cancer, adenocarcinoma type: -She was treated with chemoradiation therapy with cisplatin VP-16 from 02/18/2008 through 04/25/2008. -CT chest on 02/25/2019 showed scarring in the right upper medial lung consistent with history of prior treatment.  3 cm left lower lobe lung cyst is unchanged.  No adenopathy. - CT chest (09/08/2020): Done at [facility] did not show any evidence of recurrence.  Bilateral patchy infiltrates suggestive of infectious process.   3.  Osteoporosis: -Started on Prolia in September 2020, receives in [place].  This was prescribed by her rheumatologist in [place]. -She will continue calcium and vitamin D supplements.    Plan: 1.  Stage I (T1CN0) invasive lobular carcinoma of the left breast: - Physical exam: No palpable masses in bilateral breast.  Left lumpectomy scar in the upper outer quadrant around the areola is  within normal limits. - Mammogram on 03/13/2023: BI-RADS Category 2. - Labs from 03/13/2023: Normal LFTs.  CBC grossly normal with mild anemia with hemoglobin 10.8. - Continue anastrozole daily. - We checked B12 and folic acid which were normal today.  Ferritin was 85 and percent saturation 17.  Mild normocytic anemia likely from rheumatoid arthritis medication. - RTC 6 months for follow-up with repeat labs.   2.  Stage III (T3 N2 M0) non-small cell lung cancer, adenocarcinoma type: - Last CT chest from 09/08/2020 at [facility] did not show any evidence of recurrence. - We reviewed CT chest without contrast on 03/13/2023: Radiation fibrosis in the right upper lobe with no obvious mass.  Scattered pulmonary nodules in both lungs are indeterminate.  No mediastinal or hilar adenopathy.  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 - I have recommended that she obtain the disc of CT scan from [place] so we can compare it.   3.  Osteoporosis: - Continue Prolia in [place] under the direction of Dr. Orson Aloe of rheumatology in [place].  Last vitamin D level was normal.   4.  Rheumatoid arthritis: - Continue Arava, Plaquenil and sulfasalazine.    Orders Placed This Encounter  Procedures   Ferritin    Standing Status:   Future    Number of Occurrences:   1    Standing Expiration Date:   03/20/2024    Order Specific Question:   Release to patient    Answer:   Immediate   Iron and TIBC    Standing Status:   Future  Number of Occurrences:   1    Standing Expiration Date:   03/20/2024    Order Specific Question:   Release to patient    Answer:   Immediate   Vitamin B12    Standing Status:   Future    Number of Occurrences:   1    Standing Expiration Date:   03/20/2024    Order Specific Question:   Release to patient    Answer:   Immediate   Folate    Standing  Status:   Future    Number of Occurrences:   1    Standing Expiration Date:   03/20/2024    Order Specific Question:   Release to patient    Answer:   Immediate   CBC with Differential/Platelet    Standing Status:   Future    Standing Expiration Date:   03/20/2024    Order Specific Question:   Release to patient    Answer:   Immediate   Comprehensive metabolic panel    Standing Status:   Future    Standing Expiration Date:   03/20/2024    Order Specific Question:   Release to patient    Answer:   Immediate   VITAMIN D 25 Hydroxy (Vit-D Deficiency, Fractures)    Standing Status:   Future    Standing Expiration Date:   03/20/2024    Order Specific Question:   Release to patient    Answer:   Immediate   Cancer antigen 15-3    Standing Status:   Future    Standing Expiration Date:   03/20/2024      Mikeal Hawthorne R Teague,acting as a scribe for Doreatha Massed, MD.,have documented all relevant documentation on the behalf of Doreatha Massed, MD,as directed by  Doreatha Massed, MD while in the presence of Doreatha Massed, MD.  I, Doreatha Massed MD, have reviewed the above documentation for accuracy and completeness, and I agree with the above.    Doreatha Massed, MD   10/29/20245:37 PM  CHIEF COMPLAINT:   Diagnosis: left breast cancer    Cancer Staging  Breast cancer, left [facility]) Staging form: Breast, AJCC 8th Edition - Clinical stage from 05/14/2019: Stage IA (cT1c, cN0, cM0, G1, ER+, PR-, HER2-) - Signed by Doreatha Massed, MD on 05/14/2019    Prior Therapy: Lumpectomy and SLNB on 04/23/2019  Current Therapy:  anastrozole   HISTORY OF PRESENT ILLNESS:   Oncology History  Breast cancer, left (HCC)  04/01/2019 Initial Diagnosis   Breast cancer, left (HCC)   05/14/2019 Cancer Staging   Staging form: Breast, AJCC 8th Edition - Clinical stage from 05/14/2019: Stage IA (cT1c, cN0, cM0, G1, ER+, PR-, HER2-) - Signed by Doreatha Massed, MD on  05/14/2019      INTERVAL HISTORY:   Tomoe is a 67 y.o. female presenting to clinic today for follow up of left breast cancer. She was last seen by me on 09/19/22.  Since her last visit, she had a bilateral MM on 03/13/23 that found: no mammographic evidence of malignancy. She also had a CT chest on 03/13/23 that found: extensive airspace consolidation and right upper lobe bronchiectasis likely radiation fibrosis; no obvious pulmonary mass; scattered pulmonary nodules in both lungs, indeterminate; no mediastinal or hilar mass or overt adenopathy; mixed lytic and sclerotic changes involving the upper right ribs, the medial right clavicle and the medial right scapula; cholelithiasis; and right renal calculi.   She underwent an EGD on 11/22/22 with Dr. Levon Hedger. Pathology of the GE  junction biopsy revealed: esophageal squamous and cardiac mucosa with mild chronic nonspecific carditis, negative for intestinal metaplasia or dysplasia   Today, she states that she is doing well overall. Her appetite level is at 100%. Her energy level is at 70%.  She is taking Arava, Plaquenil and sulfasalazine as prescribed. She had a cold with congestion and a cough in January/February 2024. Her cold and congestion have resolved, though she still has an intermittent mild cough.   PAST MEDICAL HISTORY:   Past Medical History: Past Medical History:  Diagnosis Date   Allergy    Anxiety    Breast cancer (HCC) 04/2019   Clotting disorder (HCC)    Im on a blood thinner and bruise easily   Complication of anesthesia    difficulty waking up after anesthesia   COPD (chronic obstructive pulmonary disease) (HCC)    Depression    Emphysema of lung (HCC)    Encounter for well adult exam with abnormal findings 01/04/2023   GERD (gastroesophageal reflux disease)    Hyperlipemia    Hypertension    Hypothyroid    Lung cancer (HCC) 2009   Neuromuscular disorder (HCC)    Osteoporosis    PVD (peripheral vascular disease)  (HCC)    RA (rheumatoid arthritis) (HCC)    Tremors of nervous system     Surgical History: Past Surgical History:  Procedure Laterality Date   BIOPSY  11/22/2022   Procedure: BIOPSY;  Surgeon: Dolores Frame, MD;  [facility];  Service: Gastroenterology;;   BREAST LUMPECTOMY Left 04/23/2019   Invasive lobular carcinoma/Calcifications associated with carcinoma/ Margins uninvolved   BREAST LUMPECTOMY WITH RADIOACTIVE SEED AND SENTINEL LYMPH NODE BIOPSY Left 04/23/2019   Procedure: LEFT BREAST LUMPECTOMY WITH RADIOACTIVE SEED AND LEFT AXILLARY SENTINEL LYMPH NODE BIOPSY;  Surgeon: Emelia Loron, MD;  Location: [facility];  Service: General;  Laterality: Left;   CATARACT EXTRACTION, BILATERAL     ESOPHAGEAL DILATION Left 11/22/2022   Procedure: ESOPHAGEAL DILATION;  Surgeon: Dolores Frame, MD;  [facility];  Service: Gastroenterology;  Laterality: Left;  11:15am;asa 3   ESOPHAGOGASTRODUODENOSCOPY (EGD) WITH PROPOFOL N/A 11/22/2022   Procedure: ESOPHAGOGASTRODUODENOSCOPY (EGD) WITH PROPOFOL;  Surgeon: Dolores Frame, MD;  [facility];  Service: Gastroenterology;  Laterality: N/A;  11:15am;asa 3   EYE SURGERY     Cataract surgery x2   FEMORAL BYPASS Right 2017   x3   KIDNEY STONE SURGERY      Social History: Social History   Socioeconomic History   Marital status: Married    Spouse name: Tinnie Gens   Number of children: Not on file   Years of education: Not on file   Highest education level: Associate degree: occupational, Scientist, product/process development, or vocational program  Occupational History   Occupation: retires  Tobacco Use   Smoking status: Former    Current packs/day: 0.00    Average packs/day: 1 pack/day for 43.0 years (43.0 ttl pk-yrs)    Types: Cigarettes    Start date: 04/11/1972    Quit date: 04/12/2015    Years since quitting: 7.9    Passive exposure: Past   Smokeless tobacco: Never  Vaping Use    Vaping status: Never Used  Substance and Sexual Activity   Alcohol use: Not Currently    Comment: occasionally   Drug use: Never   Sexual activity: Yes    Birth control/protection: Post-menopausal  Other Topics Concern   Not on file  Social History Narrative  Lives with husband.   Social Determinants of Health   Financial Resource Strain: Low Risk  (01/03/2023)   Overall Financial Resource Strain (CARDIA)    Difficulty of Paying Living Expenses: Not very hard  Food Insecurity: No Food Insecurity (01/03/2023)   Hunger Vital Sign    Worried About Running Out of Food in the Last Year: Never true    Ran Out of Food in the Last Year: Never true  Transportation Needs: No Transportation Needs (01/03/2023)   PRAPARE - Administrator, Civil Service (Medical): No    Lack of Transportation (Non-Medical): No  Physical Activity: Insufficiently Active (01/03/2023)   Exercise Vital Sign    Days of Exercise per Week: 7 days    Minutes of Exercise per Session: 20 min  Stress: No Stress Concern Present (01/03/2023)   [employer] of Occupational Health - Occupational Stress Questionnaire    Feeling of Stress : Only a little  Social Connections: Socially Integrated (01/03/2023)   Social Connection and Isolation Panel [NHANES]    Frequency of Communication with Friends and Family: More than three times a week    Frequency of Social Gatherings with Friends and Family: Once a week    Attends Religious Services: More than 4 times per year    Active Member of [employer] or Organizations: Yes    Attends Engineer, structural: More than 4 times per year    Marital Status: Married  Catering manager Violence: Not At Risk (10/20/2022)   Humiliation, Afraid, Rape, and Kick questionnaire    Fear of Current or Ex-Partner: No    Emotionally Abused: No    Physically Abused: No    Sexually Abused: No    Family History: Family History  Problem Relation Age of Onset   Diabetes Mother     Hyperlipidemia Mother    Arthritis Mother    Heart disease Mother    Hypertension Mother    Kidney disease Mother    Lung cancer Maternal Aunt     Current Medications:  Current Outpatient Medications:    albuterol (PROAIR HFA) 108 (90 Base) MCG/ACT inhaler, Inhale 2 puffs into the lungs every 4 (four) hours as needed for shortness of breath or wheezing., Disp: 18 g, Rfl: 1   anastrozole (ARIMIDEX) 1 MG tablet, Take 1 tablet by mouth once daily, Disp: 90 tablet, Rfl: 1   arformoterol (BROVANA) 15 MCG/2ML NEBU, Take 2 mLs (15 mcg total) by nebulization in the morning and at bedtime., Disp: 120 mL, Rfl: 4   ascorbic acid (VITAMIN C) 500 MG tablet, Take 500 mg by mouth daily., Disp: , Rfl:    aspirin EC 81 MG tablet, Take 81 mg by mouth daily., Disp: , Rfl:    b complex vitamins capsule, Take 1 capsule by mouth daily., Disp: , Rfl:    baclofen (LIORESAL) 20 MG tablet, Take 20 mg by mouth 3 (three) times daily as needed for muscle spasms., Disp: , Rfl:    buPROPion (WELLBUTRIN SR) 150 MG 12 hr tablet, Take 1 tablet by mouth twice daily, Disp: 180 tablet, Rfl: 0   Calcium Carb-Cholecalciferol (CALCIUM 600/VITAMIN D PO), Take 1 tablet by mouth in the morning and at bedtime., Disp: , Rfl:    cetirizine (ZYRTEC) 10 MG tablet, Take 10 mg by mouth daily., Disp: , Rfl:    clopidogrel (PLAVIX) 75 MG tablet, Take 1 tablet by mouth once daily, Disp: 90 tablet, Rfl: 0   denosumab (PROLIA) 60 MG/ML SOSY injection, Inject  60 mg into the skin every 6 (six) months., Disp: , Rfl:    diclofenac Sodium (VOLTAREN) 1 % GEL, Apply 2 g topically 3 (three) times daily as needed (pain)., Disp: , Rfl:    docusate sodium (COLACE) 50 MG capsule, Take 50 mg by mouth as needed for mild constipation., Disp: , Rfl:    esomeprazole (NEXIUM) 20 MG capsule, Take 2 capsules (40 mg total) by mouth daily AND 1 capsule (20 mg total) at bedtime. (Patient taking differently: Take 2 capsules (40 mg total) by mouth in morning AND 1  capsule (20 mg total) at bedtime.), Disp: 270 capsule, Rfl: 3   hydroxychloroquine (PLAQUENIL) 200 MG tablet, Take 300 mg by mouth daily., Disp: , Rfl:    hydroxypropyl methylcellulose / hypromellose (ISOPTO TEARS / GONIOVISC) 2.5 % ophthalmic solution, Place 1 drop into both eyes as needed for dry eyes., Disp: , Rfl:    ibuprofen (ADVIL) 600 MG tablet, Take 600 mg by mouth every 6 (six) hours as needed for moderate pain., Disp: , Rfl:    leflunomide (ARAVA) 20 MG tablet, Take 20 mg by mouth daily., Disp: , Rfl:    levothyroxine (SYNTHROID) 137 MCG tablet, Take 1 tablet by mouth once daily, Disp: 90 tablet, Rfl: 0   losartan (COZAAR) 25 MG tablet, Take 1 tablet (25 mg total) by mouth daily., Disp: 90 tablet, Rfl: 3   meclizine (ANTIVERT) 25 MG tablet, Take 25 mg by mouth 3 (three) times daily as needed for dizziness., Disp: , Rfl:    Omega-3 Fatty Acids (FISH OIL) 1000 MG CPDR, Take by mouth daily at 6 (six) AM., Disp: , Rfl:    revefenacin (YUPELRI) 175 MCG/3ML nebulizer solution, Take 175 mcg by nebulization daily., Disp: , Rfl:    simvastatin (ZOCOR) 10 MG tablet, Take 1 tablet (10 mg total) by mouth daily., Disp: 90 tablet, Rfl: 0   sulfaSALAzine (AZULFIDINE) 500 MG tablet, Take 500-1,000 mg by mouth See admin instructions. Taking 500 mg by mouth in the morning and 1000 mg in the evening, Disp: , Rfl:    traZODone (DESYREL) 100 MG tablet, Take 1 tablet (100 mg total) by mouth at bedtime., Disp: 90 tablet, Rfl: 0   valACYclovir (VALTREX) 500 MG tablet, Take 4 tablets (2,000 mg total) by mouth 2 (two) times daily as needed (fever blisters)., Disp: 20 tablet, Rfl: 1   verapamil (VERELAN) 240 MG 24 hr capsule, Take 240 mg by mouth daily., Disp: , Rfl:    Vitamin E 268 MG (400 UNIT) TABS, Take 400 Units by mouth daily., Disp: , Rfl:    Allergies: Allergies  Allergen Reactions   Amoxicillin-Pot Clavulanate Diarrhea, Nausea And Vomiting and Nausea Only    abdominal pain    REVIEW OF SYSTEMS:    Review of Systems  Constitutional:  Negative for chills, fatigue and fever.  HENT:   Negative for lump/mass, mouth sores, nosebleeds, sore throat and trouble swallowing.   Eyes:  Negative for eye problems.  Respiratory:  Positive for cough and shortness of breath.   Cardiovascular:  Negative for chest pain, leg swelling and palpitations.  Gastrointestinal:  Negative for abdominal pain, constipation, diarrhea, nausea and vomiting.  Genitourinary:  Negative for bladder incontinence, difficulty urinating, dysuria, frequency, hematuria and nocturia.   Musculoskeletal:  Negative for arthralgias, back pain, flank pain, myalgias and neck pain.  Skin:  Negative for itching and rash.  Neurological:  Negative for dizziness, headaches and numbness.  Hematological:  Does not bruise/bleed easily.  Psychiatric/Behavioral:  Negative for depression, sleep disturbance and suicidal ideas. The patient is not nervous/anxious.   All other systems reviewed and are negative.    VITALS:   Blood pressure 136/74, pulse 76, temperature 97.8 F (36.6 C), temperature source Oral, resp. rate 18, height 5\' 2"  (1.575 m), weight 118 lb 12.8 oz (53.9 kg), SpO2 96%.  Wt Readings from Last 3 Encounters:  03/21/23 118 lb 12.8 oz (53.9 kg)  03/13/23 119 lb (54 kg)  01/26/23 121 lb 8 oz (55.1 kg)    Body mass index is 21.73 kg/m.  Performance status (ECOG): 1 - Symptomatic but completely ambulatory  PHYSICAL EXAM:   Physical Exam Vitals and nursing note reviewed. Exam conducted with a chaperone present.  Constitutional:      Appearance: Normal appearance.  Cardiovascular:     Rate and Rhythm: Normal rate and regular rhythm.     Pulses: Normal pulses.     Heart sounds: Normal heart sounds.  Pulmonary:     Effort: Pulmonary effort is normal.     Breath sounds: Normal breath sounds.  Chest:     Comments: +left lumpectomy scar outside the areola is within normal limits Abdominal:     Palpations: Abdomen is  soft. There is no hepatomegaly, splenomegaly or mass.     Tenderness: There is no abdominal tenderness.  Musculoskeletal:     Right lower leg: No edema.     Left lower leg: No edema.  Lymphadenopathy:     Cervical: No cervical adenopathy.     Right cervical: No superficial, deep or posterior cervical adenopathy.    Left cervical: No superficial, deep or posterior cervical adenopathy.     Upper Body:     Right upper body: No supraclavicular or axillary adenopathy.     Left upper body: No supraclavicular or axillary adenopathy.  Neurological:     General: No focal deficit present.     Mental Status: She is alert and oriented to person, place, and time.  Psychiatric:        Mood and Affect: Mood normal.        Behavior: Behavior normal.   Breast Exam Chaperone: Anne Fu, LPN   LABS:      Latest Ref Rng & Units 03/13/2023    1:52 PM 01/04/2023    1:42 PM 09/08/2022    8:17 AM  CBC  WBC 4.0 - 10.5 K/uL 5.1  6.0  4.6   Hemoglobin 12.0 - 15.0 g/dL 16.1  09.6  04.5   Hematocrit 36.0 - 46.0 % 33.3  32.9  34.1   Platelets 150 - 400 K/uL 244  273  220       Latest Ref Rng & Units 03/13/2023    1:52 PM 01/04/2023    1:42 PM 09/08/2022    8:17 AM  CMP  Glucose 70 - 99 mg/dL 409  68  85   BUN 8 - 23 mg/dL 15  13  24    Creatinine 0.44 - 1.00 mg/dL 8.11  9.14  7.82   Sodium 135 - 145 mmol/L 138  140  137   Potassium 3.5 - 5.1 mmol/L 3.5  4.1  3.8   Chloride 98 - 111 mmol/L 104  104  102   CO2 22 - 32 mmol/L 25  22  26    Calcium 8.9 - 10.3 mg/dL 8.9  9.1  9.3   Total Protein 6.5 - 8.1 g/dL 5.2  5.9  4.8   Total Bilirubin 0.3 - 1.2 mg/dL 0.6  0.7  0.7   Alkaline Phos 38 - 126 U/L 86  84  67   AST 15 - 41 U/L 24  19  25    ALT 0 - 44 U/L 27  19  25       No results found for: "CEA1", "CEA" / No results found for: "CEA1", "CEA" No results found for: "PSA1" No results found for: "ATF573" Lab Results  Component Value Date   CAN125 39.0 (H) 09/16/2020    No results found for:  "TOTALPROTELP", "ALBUMINELP", "A1GS", "A2GS", "BETS", "BETA2SER", "GAMS", "MSPIKE", "SPEI" Lab Results  Component Value Date   TIBC 328 03/21/2023   FERRITIN 85 03/21/2023   IRONPCTSAT 17 03/21/2023   No results found for: "LDH"   STUDIES:   CT Chest Wo Contrast  Result Date: 03/21/2023 CLINICAL DATA:  Restaging non-small cell lung cancer. Prior history of breast cancer. * Tracking Code: BO * EXAM: CT CHEST WITHOUT CONTRAST TECHNIQUE: Multidetector CT imaging of the chest was performed following the standard protocol without IV contrast. RADIATION DOSE REDUCTION: This exam was performed according to the departmental dose-optimization program which includes automated exposure control, adjustment of the mA and/or kV according to patient size and/or use of iterative reconstruction technique. COMPARISON:  None Available. FINDINGS: Cardiovascular: The heart is normal in size. No pericardial effusion. Age advanced atherosclerotic calcifications involving the aorta and coronary arteries. No aortic aneurysm. Mediastinum/Nodes: No mediastinal or hilar mass or overt adenopathy. The esophagus is grossly normal. Lungs/Pleura: Extensive airspace consolidation and right upper lobe bronchiectasis likely radiation fibrosis. No obvious pulmonary mass. Scattered pulmonary nodules in both lungs are and indeterminate finding without prior studies. Could not exclude metastatic disease. 4 mm right lower lobe nodule on image number 44/3. 5 mm right lower lobe nodule on image number 49/3. 4 mm left upper lobe nodule on image number 67/3. 8 mm subpleural nodule at the right lung base on image number 120/3. Several other smaller bilateral nodules. Underlying emphysematous changes and pulmonary scarring. The central tracheobronchial tree is. No pleural effusions pleural nodules. Upper Abdomen: No findings for upper abdominal metastatic disease. No obvious hepatic or adrenal gland lesions. Cholelithiasis noted with a 2 cm rim  calcified gallstone. Right renal calculi. Age advanced vascular calcifications. Musculoskeletal: Surgical changes involving the left breast. No breast masses, supraclavicular or axillary adenopathy. Mixed lytic and sclerotic changes involving the upper right ribs, the medial right clavicle and the medial right scapula. These findings are most likely due to radiation induced osteonecrosis. Comparison with prior CT scans may be helpful to document stability. I do not see any worrisome bone lesions elsewhere to suggest osseous metastatic disease. IMPRESSION: 1. Extensive airspace consolidation and right upper lobe bronchiectasis likely radiation fibrosis. No obvious pulmonary mass. 2. Scattered pulmonary nodules in both lungs are an indeterminate finding without prior studies. Could not exclude metastatic disease. Correlation with any recent prior chest CTs would be helpful. 3. No mediastinal or hilar mass or overt adenopathy. 4. Mixed lytic and sclerotic changes involving the upper right ribs, the medial right clavicle and the medial right scapula. These findings are most likely due to radiation induced osteonecrosis. Comparison with prior CT scans may be helpful to document stability. 5. Cholelithiasis. 6. Right renal calculi. Aortic Atherosclerosis (ICD10-I70.0) and Emphysema (ICD10-J43.9). Electronically Signed   By: Rudie Meyer M.D.   On: 03/21/2023 13:57   MM 3D SCREENING MAMMOGRAM BILATERAL BREAST  Result Date: 03/15/2023 CLINICAL DATA:  Screening. EXAM: DIGITAL SCREENING BILATERAL MAMMOGRAM WITH TOMOSYNTHESIS AND CAD TECHNIQUE:  Bilateral screening digital craniocaudal and mediolateral oblique mammograms were obtained. Bilateral screening digital breast tomosynthesis was performed. The images were evaluated with computer-aided detection. COMPARISON:  Previous exam(s). ACR Breast Density Category c: The breasts are heterogeneously dense, which may obscure small masses. FINDINGS: There are no findings  suspicious for malignancy. LEFT breast surgical changes again noted. IMPRESSION: No mammographic evidence of malignancy. A result letter of this screening mammogram will be mailed directly to the patient. RECOMMENDATION: Screening mammogram in one year. (Code:[id number]) BI-RADS CATEGORY  2: Benign. Electronically Signed   By: Harmon Pier M.D.   On: 03/15/2023 12:15

## 2023-03-22 ENCOUNTER — Other Ambulatory Visit: Payer: Self-pay | Admitting: Hematology

## 2023-03-22 DIAGNOSIS — C50412 Malignant neoplasm of upper-outer quadrant of left female breast: Secondary | ICD-10-CM

## 2023-04-19 ENCOUNTER — Other Ambulatory Visit: Payer: Self-pay | Admitting: Internal Medicine

## 2023-05-01 ENCOUNTER — Other Ambulatory Visit: Payer: Self-pay | Admitting: Internal Medicine

## 2023-05-04 ENCOUNTER — Other Ambulatory Visit: Payer: Self-pay | Admitting: Internal Medicine

## 2023-06-19 ENCOUNTER — Other Ambulatory Visit: Payer: Self-pay | Admitting: *Deleted

## 2023-06-19 DIAGNOSIS — Z170 Estrogen receptor positive status [ER+]: Secondary | ICD-10-CM

## 2023-06-19 MED ORDER — ANASTROZOLE 1 MG PO TABS: 1.0000 mg | ORAL_TABLET | Freq: Every day | ORAL | 3 refills | Status: DC

## 2023-06-19 NOTE — Telephone Encounter (Signed)
Per last office note, okay to refill Anastrozole.

## 2023-06-28 ENCOUNTER — Other Ambulatory Visit: Payer: Self-pay | Admitting: *Deleted

## 2023-06-28 ENCOUNTER — Encounter: Payer: Self-pay | Admitting: *Deleted

## 2023-06-28 DIAGNOSIS — Z85118 Personal history of other malignant neoplasm of bronchus and lung: Secondary | ICD-10-CM

## 2023-06-28 NOTE — Progress Notes (Signed)
 Per Dr. Katragadda, order entered for CT Chest w/o contrast ordered to be done prior to follow up in April.  Patient aware.

## 2023-07-10 ENCOUNTER — Ambulatory Visit (INDEPENDENT_AMBULATORY_CARE_PROVIDER_SITE_OTHER): Payer: Medicare Other | Admitting: Internal Medicine

## 2023-07-10 VITALS — BP 111/67 | HR 86 | Ht 62.0 in | Wt 118.8 lb

## 2023-07-10 DIAGNOSIS — I10 Essential (primary) hypertension: Secondary | ICD-10-CM

## 2023-07-10 DIAGNOSIS — R1314 Dysphagia, pharyngoesophageal phase: Secondary | ICD-10-CM

## 2023-07-10 DIAGNOSIS — F5104 Psychophysiologic insomnia: Secondary | ICD-10-CM | POA: Diagnosis not present

## 2023-07-10 DIAGNOSIS — J449 Chronic obstructive pulmonary disease, unspecified: Secondary | ICD-10-CM | POA: Diagnosis not present

## 2023-07-10 DIAGNOSIS — C3491 Malignant neoplasm of unspecified part of right bronchus or lung: Secondary | ICD-10-CM

## 2023-07-10 DIAGNOSIS — E039 Hypothyroidism, unspecified: Secondary | ICD-10-CM

## 2023-07-10 DIAGNOSIS — C50412 Malignant neoplasm of upper-outer quadrant of left female breast: Secondary | ICD-10-CM

## 2023-07-10 DIAGNOSIS — M069 Rheumatoid arthritis, unspecified: Secondary | ICD-10-CM

## 2023-07-10 DIAGNOSIS — Z170 Estrogen receptor positive status [ER+]: Secondary | ICD-10-CM

## 2023-07-10 MED ORDER — TRAZODONE HCL 150 MG PO TABS: 150.0000 mg | ORAL_TABLET | Freq: Every day | ORAL | 1 refills | Status: DC

## 2023-07-10 NOTE — Assessment & Plan Note (Signed)
 Currently prescribed trazodone 100 mg nightly, which she feels is partially effective.  She still endorses difficulty with insomnia and would like to increase the dose of trazodone to 150 mg nightly. -Increase trazodone 150 mg nightly.

## 2023-07-10 NOTE — Assessment & Plan Note (Signed)
 Seen by oncology for follow-up in October 2024.  Remains on anastrozole.  Mammogram updated last fall.

## 2023-07-10 NOTE — Assessment & Plan Note (Signed)
 Asymptomatic currently.  Pulmonary exam unremarkable.  Remains on [place].  Seen by pulmonology for follow-up in October 2024.

## 2023-07-10 NOTE — Assessment & Plan Note (Signed)
 Remains adequately controlled on current antihypertensive regimen.  No medication changes are indicated today.

## 2023-07-10 NOTE — Assessment & Plan Note (Signed)
 Followed by rheumatology at the [facility] in [place].  Remains on Plaquenil, leflunomide, and sulfasalazine.

## 2023-07-10 NOTE — Progress Notes (Signed)
 Established Patient Office Visit  Subjective   Patient ID: Christy Bennett, female    DOB: 11/17/1955  Age: 68 y.o. MRN: [id number]  Chief Complaint  Patient presents with   Care Management    Six month follow up    Christy Bennett returns to care today for routine follow-up.  She was last evaluated by me in August 2024 for her annual exam.  In the interim she has been evaluated by gastroenterology, pulmonology, oncology, rheumatology, and orthopedic surgery. Christy Bennett reports feeling well today.  She is asymptomatic and has no acute concerns to discuss.  Past Medical History:  Diagnosis Date   Allergy    Anxiety    Breast cancer (HCC) 04/2019   Clotting disorder (HCC)    Im on a blood thinner and bruise easily   Complication of anesthesia    difficulty waking up after anesthesia   COPD (chronic obstructive pulmonary disease) (HCC)    Depression    Emphysema of lung (HCC)    Encounter for well adult exam with abnormal findings 01/04/2023   GERD (gastroesophageal reflux disease)    Hyperlipemia    Hypertension    Hypothyroid    Lung cancer (HCC) 2009   Neuromuscular disorder (HCC)    Osteoporosis    PVD (peripheral vascular disease) (HCC)    RA (rheumatoid arthritis) (HCC)    Tremors of nervous system    Past Surgical History:  Procedure Laterality Date   BIOPSY  11/22/2022   Procedure: BIOPSY;  Surgeon: Dolores Frame, MD;  [facility];  Service: Gastroenterology;;   BREAST LUMPECTOMY Left 04/23/2019   Invasive lobular carcinoma/Calcifications associated with carcinoma/ Margins uninvolved   BREAST LUMPECTOMY WITH RADIOACTIVE SEED AND SENTINEL LYMPH NODE BIOPSY Left 04/23/2019   Procedure: LEFT BREAST LUMPECTOMY WITH RADIOACTIVE SEED AND LEFT AXILLARY SENTINEL LYMPH NODE BIOPSY;  Surgeon: Emelia Loron, MD;  Location: [facility];  Service: General;  Laterality: Left;   CATARACT EXTRACTION, BILATERAL     ESOPHAGEAL DILATION Left  11/22/2022   Procedure: ESOPHAGEAL DILATION;  Surgeon: Dolores Frame, MD;  [facility];  Service: Gastroenterology;  Laterality: Left;  11:15am;asa 3   ESOPHAGOGASTRODUODENOSCOPY (EGD) WITH PROPOFOL N/A 11/22/2022   Procedure: ESOPHAGOGASTRODUODENOSCOPY (EGD) WITH PROPOFOL;  Surgeon: Dolores Frame, MD;  [facility];  Service: Gastroenterology;  Laterality: N/A;  11:15am;asa 3   EYE SURGERY     Cataract surgery x2   FEMORAL BYPASS Right 2017   x3   KIDNEY STONE SURGERY     Social History   Tobacco Use   Smoking status: Former    Current packs/day: 0.00    Average packs/day: 1 pack/day for 43.0 years (43.0 ttl pk-yrs)    Types: Cigarettes    Start date: 04/11/1972    Quit date: 04/12/2015    Years since quitting: 8.2    Passive exposure: Past   Smokeless tobacco: Never  Vaping Use   Vaping status: Never Used  Substance Use Topics   Alcohol use: Not Currently    Comment: occasionally   Drug use: Never   Family History  Problem Relation Age of Onset   Diabetes Mother    Hyperlipidemia Mother    Arthritis Mother    Heart disease Mother    Hypertension Mother    Kidney disease Mother    Lung cancer Maternal Aunt    Allergies  Allergen Reactions   Amoxicillin-Pot Clavulanate Diarrhea, Nausea And Vomiting and Nausea Only  abdominal pain   Review of Systems  Constitutional:  Negative for chills and fever.  HENT:  Negative for sore throat.   Respiratory:  Negative for cough and shortness of breath.   Cardiovascular:  Negative for chest pain, palpitations and leg swelling.  Gastrointestinal:  Negative for abdominal pain, blood in stool, constipation, diarrhea, nausea and vomiting.  Genitourinary:  Negative for dysuria and hematuria.  Musculoskeletal:  Negative for myalgias.  Skin:  Negative for itching and rash.  Neurological:  Negative for dizziness and headaches.  Psychiatric/Behavioral:  Negative for depression and  suicidal ideas.      Objective:     BP 111/67 (BP Location: Right Arm, Patient Position: Sitting, Cuff Size: Normal)   Pulse 86   Ht 5\' 2"  (1.575 m)   Wt 118 lb 12.8 oz (53.9 kg)   SpO2 92%   BMI 21.73 kg/m  BP Readings from Last 3 Encounters:  07/10/23 111/67  03/21/23 136/74  03/13/23 (!) 171/79   Physical Exam Vitals reviewed.  Constitutional:      General: She is not in acute distress.    Appearance: Normal appearance. She is not toxic-appearing.  HENT:     Head: Normocephalic and atraumatic.     Right Ear: External ear normal.     Left Ear: External ear normal.     Nose: Nose normal. No congestion or rhinorrhea.     Mouth/Throat:     Mouth: Mucous membranes are moist.     Pharynx: Oropharynx is clear. No oropharyngeal exudate or posterior oropharyngeal erythema.  Eyes:     General: No scleral icterus.    Extraocular Movements: Extraocular movements intact.     Conjunctiva/sclera: Conjunctivae normal.     Pupils: Pupils are equal, round, and reactive to light.  Cardiovascular:     Rate and Rhythm: Normal rate and regular rhythm.     Pulses: Normal pulses.     Heart sounds: Murmur heard.     No friction rub. No gallop.  Pulmonary:     Effort: Pulmonary effort is normal.     Breath sounds: Normal breath sounds. No wheezing, rhonchi or rales.  Abdominal:     General: Abdomen is flat. Bowel sounds are normal. There is no distension.     Palpations: Abdomen is soft.     Tenderness: There is no abdominal tenderness.  Musculoskeletal:        General: No swelling. Normal range of motion.     Cervical back: Normal range of motion.     Right lower leg: No edema.     Left lower leg: No edema.  Lymphadenopathy:     Cervical: No cervical adenopathy.  Skin:    General: Skin is warm and dry.     Capillary Refill: Capillary refill takes less than 2 seconds.     Coloration: Skin is not jaundiced.  Neurological:     General: No focal deficit present.     Mental Status:  She is alert and oriented to person, place, and time.  Psychiatric:        Mood and Affect: Mood normal.        Behavior: Behavior normal.   Last CBC Lab Results  Component Value Date   WBC 5.1 03/13/2023   HGB 10.8 (L) 03/13/2023   HCT 33.3 (L) 03/13/2023   MCV 98.8 03/13/2023   MCH 32.0 03/13/2023   RDW 13.3 03/13/2023   PLT 244 03/13/2023   Last metabolic panel Lab Results  Component Value Date  GLUCOSE 124 (H) 03/13/2023   NA 138 03/13/2023   K 3.5 03/13/2023   CL 104 03/13/2023   CO2 25 03/13/2023   BUN 15 03/13/2023   CREATININE 0.73 03/13/2023   GFRNONAA >60 03/13/2023   CALCIUM 8.9 03/13/2023   PROT 5.2 (L) 03/13/2023   ALBUMIN 3.9 03/13/2023   LABGLOB 1.5 01/04/2023   BILITOT 0.6 03/13/2023   ALKPHOS 86 03/13/2023   AST 24 03/13/2023   ALT 27 03/13/2023   ANIONGAP 9 03/13/2023   Last lipids Lab Results  Component Value Date   CHOL 172 01/04/2023   HDL 73 01/04/2023   LDLCALC 87 01/04/2023   TRIG 63 01/04/2023   CHOLHDL 2.4 01/04/2023   Last hemoglobin A1c Lab Results  Component Value Date   HGBA1C <4.2 (L) 01/04/2023   Last thyroid functions Lab Results  Component Value Date   TSH 0.601 01/04/2023   Last vitamin D Lab Results  Component Value Date   VD25OH 62.3 01/04/2023   Last vitamin B12 and Folate Lab Results  Component Value Date   VITAMINB12 1,076 (H) 03/21/2023   FOLATE 21.4 03/21/2023   The 10-year ASCVD risk score (Arnett DK, et al., 2019) is: 7.7%    Assessment & Plan:   Problem List Items Addressed This Visit       Essential hypertension   Remains adequately controlled on current antihypertensive regimen.  No medication changes are indicated today.      COPD  GOLD ?   Asymptomatic currently.  Pulmonary exam unremarkable.  Remains on [place].  Seen by pulmonology for follow-up in October 2024.      NSCLC of right lung [facility])   Seen by oncology for follow-up in October 2024.  Repeat CT chest notable for  scattered pulmonary nodules with metastatic disease not excluded.  Correlation with prior CT chest needed.  Repeat CT chest scheduled for early April.      Dysphagia   Seen by gastroenterology for follow-up in September 2024.  Remains on Nexium.  S/p EGD with dilation in July 2024.      Hypothyroidism   She remains on levothyroxine 137 mcg daily.  Thyroid studies within normal limits when updated in August 2024.      Rheumatoid arthritis (HCC)   Followed by rheumatology at the [facility] in [place].  Remains on Plaquenil, leflunomide, and sulfasalazine.      Breast cancer, left [facility])   Seen by oncology for follow-up in October 2024.  Remains on anastrozole.  Mammogram updated last fall.      Insomnia - Primary   Currently prescribed trazodone 100 mg nightly, which she feels is partially effective.  She still endorses difficulty with insomnia and would like to increase the dose of trazodone to 150 mg nightly. -Increase trazodone 150 mg nightly.      Return in about 6 months (around 01/07/2024) for CPE.   Billie Lade, MD

## 2023-07-10 NOTE — Assessment & Plan Note (Signed)
 Seen by oncology for follow-up in October 2024.  Repeat CT chest notable for scattered pulmonary nodules with metastatic disease not excluded.  Correlation with prior CT chest needed.  Repeat CT chest scheduled for early April.

## 2023-07-10 NOTE — Assessment & Plan Note (Signed)
 She remains on levothyroxine 137 mcg daily.  Thyroid studies within normal limits when updated in August 2024.

## 2023-07-10 NOTE — Patient Instructions (Signed)
 It was a pleasure to see you today.  Thank you for giving [place] the opportunity to be involved in your care.  Below is a brief recap of your visit and next steps.  We will plan to see you again in 6 months.  Summary Increase trazodone to 150 mg daily No additional medication changes Follow up in 6 months

## 2023-07-10 NOTE — Assessment & Plan Note (Signed)
 Seen by gastroenterology for follow-up in September 2024.  Remains on Nexium.  S/p EGD with dilation in July 2024.

## 2023-07-17 ENCOUNTER — Other Ambulatory Visit: Payer: Self-pay | Admitting: Internal Medicine

## 2023-07-18 ENCOUNTER — Other Ambulatory Visit: Payer: Self-pay | Admitting: Internal Medicine

## 2023-07-27 ENCOUNTER — Ambulatory Visit (INDEPENDENT_AMBULATORY_CARE_PROVIDER_SITE_OTHER): Payer: Medicare Other | Admitting: Gastroenterology

## 2023-07-27 ENCOUNTER — Encounter (INDEPENDENT_AMBULATORY_CARE_PROVIDER_SITE_OTHER): Payer: Self-pay | Admitting: Gastroenterology

## 2023-07-27 VITALS — BP 114/71 | HR 77 | Temp 98.1°F | Ht 62.0 in | Wt 122.6 lb

## 2023-07-27 DIAGNOSIS — K219 Gastro-esophageal reflux disease without esophagitis: Secondary | ICD-10-CM

## 2023-07-27 DIAGNOSIS — K2100 Gastro-esophageal reflux disease with esophagitis, without bleeding: Secondary | ICD-10-CM

## 2023-07-27 MED ORDER — ESOMEPRAZOLE MAGNESIUM 20 MG PO CPDR: 20.0000 mg | DELAYED_RELEASE_CAPSULE | Freq: Two times a day (BID) | ORAL | 1 refills | Status: DC

## 2023-07-27 NOTE — Progress Notes (Addendum)
 Referring Provider: Billie Lade, MD Primary Care Physician:  Billie Lade, MD Primary GI Physician: Dr. Levon Hedger   Chief Complaint  Patient presents with   Gastroesophageal Reflux    Follow up on GERD. Concerned about taking 40mg  in the morning and 20mg  int he evening and wonders if its too much.    HPI:   Christy Bennett is a 68 y.o. 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   Patient presenting today for follow up of: GERD  Last seen September 2024, at that time, patient reported she was doing nexium 60mg  in the morning, unaware she needed to split into a 40mg /20mg  dose. Has some hoarseness but this improved with nexium. Dysphagia resolved. No heartburn or acid regurgitation.    Recommended to continue with nexium, 40mg  in the morning and 20mg  in the evening, reflux precautions, TIF brochure provided  Present:  Patient states doing nexium 40mg  in the morning and 20mg  in the evenings. No dysphagia at this time. She denies breakthrough reflux symptoms. Denies nausea, vomiting, abdominal pain,  early satiety, weight loss. She did have a decline in her appetite back in the winter and was struggling to keep her weight up though this has improved, weight has increased some and appears stable. Her appetite has returned to normal. She denies rectal bleeding or melena. No changes in bowel habits s  Last Colonoscopy: 10/2020 years ago per [facility] in [place], no polyps  Last Endoscopy:11/2022 Benign-appearing esophageal stenosis. Biopsied.( Esophageal squamous and cardiac mucosa with mild chronic nonspecific carditis) - Negative  for intestinal metaplasia or dysplasia                           Dilated.                           - 1 cm hiatal hernia.                           - Normal stomach.                           - Normal examined duodenum.     Filed Weights   07/27/23 1312  Weight: 122 lb 9.6 oz (55.6 kg)     Past Medical History:  Diagnosis Date   Allergy    Anxiety    Breast cancer (HCC) 04/2019   Clotting disorder (HCC)    Im on a blood thinner and bruise easily   Complication of anesthesia    difficulty waking up after anesthesia   COPD (chronic obstructive pulmonary disease) (HCC)    Depression    Emphysema of lung (HCC)    Encounter for well adult exam with abnormal findings 01/04/2023   GERD (gastroesophageal reflux disease)    Hyperlipemia    Hypertension    Hypothyroid    Lung cancer (HCC) 2009   Neuromuscular disorder (HCC)    Osteoporosis    PVD (peripheral vascular disease) (HCC)    RA (rheumatoid arthritis) (HCC)    Tremors of nervous system  Past Surgical History:  Procedure Laterality Date   BIOPSY  11/22/2022   Procedure: BIOPSY;  Surgeon: Marguerita Merles, Reuel Boom, MD;  [facility];  Service: Gastroenterology;;   BREAST LUMPECTOMY Left 04/23/2019   Invasive lobular carcinoma/Calcifications associated with carcinoma/ Margins uninvolved   BREAST LUMPECTOMY WITH RADIOACTIVE SEED AND SENTINEL LYMPH NODE BIOPSY Left 04/23/2019   Procedure: LEFT BREAST LUMPECTOMY WITH RADIOACTIVE SEED AND LEFT AXILLARY SENTINEL LYMPH NODE BIOPSY;  Surgeon: Emelia Loron, MD;  Location: [facility];  Service: General;  Laterality: Left;   CATARACT EXTRACTION, BILATERAL     ESOPHAGEAL DILATION Left 11/22/2022   Procedure: ESOPHAGEAL DILATION;  Surgeon: Dolores Frame, MD;  [facility];  Service: Gastroenterology;  Laterality: Left;  11:15am;asa 3   ESOPHAGOGASTRODUODENOSCOPY (EGD) WITH PROPOFOL N/A 11/22/2022   Procedure:  ESOPHAGOGASTRODUODENOSCOPY (EGD) WITH PROPOFOL;  Surgeon: Dolores Frame, MD;  [facility];  Service: Gastroenterology;  Laterality: N/A;  11:15am;asa 3   EYE SURGERY     Cataract surgery x2   FEMORAL BYPASS Right 2017   x3   KIDNEY STONE SURGERY      Current Outpatient Medications  Medication Sig Dispense Refill   albuterol (PROAIR HFA) 108 (90 Base) MCG/ACT inhaler Inhale 2 puffs into the lungs every 4 (four) hours as needed for shortness of breath or wheezing. 18 g 1   anastrozole (ARIMIDEX) 1 MG tablet Take 1 tablet (1 mg total) by mouth daily. 90 tablet 3   arformoterol (BROVANA) 15 MCG/2ML NEBU Take 2 mLs (15 mcg total) by nebulization in the morning and at bedtime. 120 mL 4   ascorbic acid (VITAMIN C) 500 MG tablet Take 500 mg by mouth daily.     aspirin EC 81 MG tablet Take 81 mg by mouth daily.     b complex vitamins capsule Take 1 capsule by mouth daily.     buPROPion (WELLBUTRIN SR) 150 MG 12 hr tablet Take 1 tablet by mouth twice daily 180 tablet 0   Calcium Carb-Cholecalciferol (CALCIUM 600/VITAMIN D PO) Take 1 tablet by mouth. 1200 one daily     cetirizine (ZYRTEC) 10 MG tablet Take 10 mg by mouth daily.     clopidogrel (PLAVIX) 75 MG tablet Take 1 tablet by mouth once daily 90 tablet 0   denosumab (PROLIA) 60 MG/ML SOSY injection Inject 60 mg into the skin every 6 (six) months.     diclofenac Sodium (VOLTAREN) 1 % GEL Apply 2 g topically 3 (three) times daily as needed (pain).     docusate sodium (COLACE) 50 MG capsule Take 50 mg by mouth as needed for mild constipation.     esomeprazole (NEXIUM) 20 MG capsule Take 2 capsules (40 mg total) by mouth daily AND 1 capsule (20 mg total) at bedtime. (Patient taking differently: Take 2 capsules (40 mg total) by mouth in morning AND 1 capsule (20 mg total) at bedtime.) 270 capsule 3   hydroxychloroquine (PLAQUENIL) 200 MG tablet Take 300 mg by mouth daily.     hydroxypropyl methylcellulose / hypromellose (ISOPTO  TEARS / GONIOVISC) 2.5 % ophthalmic solution Place 1 drop into both eyes as needed for dry eyes.     ibuprofen (ADVIL) 600 MG tablet Take 600 mg by mouth every 6 (six) hours as needed for moderate pain.     leflunomide (ARAVA) 20 MG tablet Take 20 mg by mouth daily.     levothyroxine (SYNTHROID) 137 MCG tablet Take 1 tablet by mouth once daily 90  tablet 0   losartan (COZAAR) 25 MG tablet Take 1 tablet (25 mg total) by mouth daily. 90 tablet 3   meclizine (ANTIVERT) 25 MG tablet Take 25 mg by mouth 3 (three) times daily as needed for dizziness.     revefenacin (YUPELRI) 175 MCG/3ML nebulizer solution Take 175 mcg by nebulization daily.     simvastatin (ZOCOR) 10 MG tablet Take 1 tablet by mouth once daily 90 tablet 0   sulfaSALAzine (AZULFIDINE) 500 MG tablet Take 500-1,000 mg by mouth See admin instructions. Taking 500 mg by mouth in the morning and 1000 mg in the evening     traZODone (DESYREL) 150 MG tablet Take 1 tablet (150 mg total) by mouth at bedtime. 90 tablet 1   valACYclovir (VALTREX) 500 MG tablet Take 4 tablets (2,000 mg total) by mouth 2 (two) times daily as needed (fever blisters). 20 tablet 1   verapamil (VERELAN) 240 MG 24 hr capsule Take 240 mg by mouth daily.     Vitamin E 268 MG (400 UNIT) TABS Take 400 Units by mouth daily.     No current facility-administered medications for this visit.    Allergies as of 07/27/2023 - Review Complete 07/27/2023  Allergen Reaction Noted   Amoxicillin-pot clavulanate Diarrhea, Nausea And Vomiting, and Nausea Only 09/02/2016    Social History   Socioeconomic History   Marital status: Married    Spouse name: Christy Bennett   Number of children: Not on file   Years of education: Not on file   Highest education level: Some college, no degree  Occupational History   Occupation: retires  Tobacco Use   Smoking status: Former    Current packs/day: 0.00    Average packs/day: 1 pack/day for 43.0 years (43.0 ttl pk-yrs)    Types: Cigarettes     Start date: 04/11/1972    Quit date: 04/12/2015    Years since quitting: 8.2    Passive exposure: Past   Smokeless tobacco: Never  Vaping Use   Vaping status: Never Used  Substance and Sexual Activity   Alcohol use: Not Currently    Comment: occasionally   Drug use: Never   Sexual activity: Yes    Birth control/protection: Post-menopausal  Other Topics Concern   Not on file  Social History Narrative   Lives with husband.   Social Drivers of Corporate investment banker Strain: Low Risk  (07/10/2023)   Overall Financial Resource Strain (CARDIA)    Difficulty of Paying Living Expenses: Not very hard  Food Insecurity: No Food Insecurity (07/10/2023)   Hunger Vital Sign    Worried About Running Out of Food in the Last Year: Never true    Ran Out of Food in the Last Year: Never true  Transportation Needs: No Transportation Needs (07/10/2023)   PRAPARE - Administrator, Civil Service (Medical): No    Lack of Transportation (Non-Medical): No  Physical Activity: Insufficiently Active (07/10/2023)   Exercise Vital Sign    Days of Exercise per Week: 2 days    Minutes of Exercise per Session: 10 min  Stress: No Stress Concern Present (07/10/2023)   [employer] of Occupational Health - Occupational Stress Questionnaire    Feeling of Stress : Only a little  Social Connections: Socially Integrated (07/10/2023)   Social Connection and Isolation Panel [NHANES]    Frequency of Communication with Friends and Family: Twice a week    Frequency of Social Gatherings with Friends and Family: Once a week  Attends Religious Services: More than 4 times per year    Active Member of Clubs or Organizations: Yes    Attends Banker Meetings: 1 to 4 times per year    Marital Status: Married    Review of systems General: negative for malaise, night sweats, fever, chills, weight los Neck: Negative for lumps, goiter, pain and significant neck swelling Resp: Negative for  cough, wheezing, dyspnea at rest CV: Negative for chest pain, leg swelling, palpitations, orthopnea GI: denies melena, hematochezia, nausea, vomiting, diarrhea, constipation, dysphagia, odyonophagia, early satiety or unintentional weight loss.  The remainder of the review of systems is noncontributory.  Physical Exam: BP 114/71   Pulse 77   Temp 98.1 F (36.7 C) (Oral)   Ht 5\' 2"  (1.575 m)   Wt 122 lb 9.6 oz (55.6 kg)   BMI 22.42 kg/m  General:   Alert and oriented. No distress noted. Pleasant and cooperative.  Head:  Normocephalic and atraumatic. Eyes:  Conjuctiva clear without scleral icterus. Mouth:  Oral mucosa pink and moist. Good dentition. No lesions. Heart: Normal rate and rhythm, s1 and s2 heart sounds present.  Lungs: Clear lung sounds in all lobes. Respirations equal and unlabored. Abdomen:  +BS, soft, non-tender and non-distended. No rebound or guarding. No HSM or masses noted. Neurologic:  Alert and  oriented x4 Psych:  Alert and cooperative. Normal mood and affect.  Invalid input(s): "6 MONTHS"   ASSESSMENT: Christy Bennett is a 68 y.o. female presenting today for follow up of GERD  GERD well controlled on nexium 40mg  a.m/20mg  pm without any breakthrough symptoms. Denies dysphagia. She would like to try decreasing her PPI dose which I think is reasonable given her symptoms are well-controlled and with her history of osteoporosis.  Will try doing 20 mg of Nexium in the mornings and 20 mg of Nexium in the evenings as she has notably tended to have worsening reflux symptoms overnight in the past.  She should be mindful and implement good reflux precautions and let me know if symptoms are not well-controlled on decreasing PPI dose.   PLAN:  -decrease nexium to 20mg  in the morning and 20mg  in the evening (printed Rx at patient's request so she can find cheapest priced pharmacy to obtain this from)  -good reflux precautions -pt to make me aware if symptoms not well  controlled on decreased PPI dosing  All questions were answered, patient verbalized understanding and is in agreement with plan as outlined above.   Follow Up: 1 year   Eragon Hammond L. Jeanmarie Hubert, MSN, APRN, AGNP-C Adult-Gerontology Nurse Practitioner [facility]  I have reviewed the note and agree with the APP's assessment as described in this progress note  If interested in having TIF procedure in the future and trying to be off medication, can discuss this again in clinic.  Katrinka Blazing, MD Gastroenterology and Hepatology [facility]

## 2023-07-27 NOTE — Patient Instructions (Signed)
 As GERD is doing well we will decrease Nexium to 20 mg in the morning prior to breakfast and 20 mg in the evening prior to dinner Be mindful of greasy, spicy, tomato/citrus based foods, caffeine, chocolate and alcohol as these can increase reflux symptoms Stay upright 2- 3 hours after eating prior to lying down Avoiding late evening Please make me aware if decreasing Nexium dose does not control your symptoms.  Follow up 1 year  It was a pleasure to see you today. I want to create trusting relationships with patients and provide genuine, compassionate, and quality care. I truly value your feedback! please be on the lookout for a survey regarding your visit with me today. I appreciate your input about our visit and your time in completing this!    Tyris Eliot L. Jeanmarie Hubert, MSN, APRN, AGNP-C Adult-Gerontology Nurse Practitioner [facility] at [facility]

## 2023-08-12 ENCOUNTER — Other Ambulatory Visit: Payer: Self-pay | Admitting: Internal Medicine

## 2023-08-22 ENCOUNTER — Ambulatory Visit (HOSPITAL_COMMUNITY)
Admission: RE | Admit: 2023-08-22 | Discharge: 2023-08-22 | Disposition: A | Discharge status: Home / Self Care | Payer: Medicare Other | Source: Ambulatory Visit | Attending: Hematology | Admitting: Hematology

## 2023-08-22 ENCOUNTER — Inpatient Hospital Stay: Attending: Hematology

## 2023-08-22 DIAGNOSIS — D649 Anemia, unspecified: Secondary | ICD-10-CM | POA: Diagnosis not present

## 2023-08-22 DIAGNOSIS — E559 Vitamin D deficiency, unspecified: Secondary | ICD-10-CM

## 2023-08-22 DIAGNOSIS — M069 Rheumatoid arthritis, unspecified: Secondary | ICD-10-CM | POA: Insufficient documentation

## 2023-08-22 DIAGNOSIS — C50912 Malignant neoplasm of unspecified site of left female breast: Secondary | ICD-10-CM | POA: Insufficient documentation

## 2023-08-22 DIAGNOSIS — Z79631 Long term (current) use of antimetabolite agent: Secondary | ICD-10-CM | POA: Diagnosis not present

## 2023-08-22 DIAGNOSIS — Z85118 Personal history of other malignant neoplasm of bronchus and lung: Secondary | ICD-10-CM | POA: Insufficient documentation

## 2023-08-22 DIAGNOSIS — Z170 Estrogen receptor positive status [ER+]: Secondary | ICD-10-CM | POA: Diagnosis not present

## 2023-08-22 DIAGNOSIS — Z79811 Long term (current) use of aromatase inhibitors: Secondary | ICD-10-CM | POA: Insufficient documentation

## 2023-08-22 DIAGNOSIS — Z923 Personal history of irradiation: Secondary | ICD-10-CM | POA: Diagnosis not present

## 2023-08-22 DIAGNOSIS — Z79899 Other long term (current) drug therapy: Secondary | ICD-10-CM | POA: Insufficient documentation

## 2023-08-22 DIAGNOSIS — M810 Age-related osteoporosis without current pathological fracture: Secondary | ICD-10-CM | POA: Diagnosis not present

## 2023-08-22 DIAGNOSIS — Z801 Family history of malignant neoplasm of trachea, bronchus and lung: Secondary | ICD-10-CM | POA: Insufficient documentation

## 2023-08-22 LAB — COMPREHENSIVE METABOLIC PANEL WITH GFR
ALT: 28 U/L (ref 0–44)
AST: 20 U/L (ref 15–41)
Albumin: 3.7 g/dL (ref 3.5–5.0)
Alkaline Phosphatase: 89 U/L (ref 38–126)
Anion gap: 9 (ref 5–15)
BUN: 15 mg/dL (ref 8–23)
CO2: 27 mmol/L (ref 22–32)
Calcium: 9.7 mg/dL (ref 8.9–10.3)
Chloride: 102 mmol/L (ref 98–111)
Creatinine, Ser: 0.63 mg/dL (ref 0.44–1.00)
GFR, Estimated: >60 mL/min (ref >60)
Glucose, Bld: 99 mg/dL (ref 70–99)
Potassium: 4.1 mmol/L (ref 3.5–5.1)
Sodium: 138 mmol/L (ref 135–145)
Total Bilirubin: 0.9 mg/dL (ref 0.0–1.2)
Total Protein: 5.2 g/dL — ABNORMAL LOW (ref 6.5–8.1)

## 2023-08-22 LAB — CBC WITH DIFFERENTIAL/PLATELET
Abs Immature Granulocytes: 0.01 10*3/uL (ref 0.00–0.07)
Basophils Absolute: 0.1 10*3/uL (ref 0.0–0.1)
Basophils Relative: 1 %
Eosinophils Absolute: 0.3 10*3/uL (ref 0.0–0.5)
Eosinophils Relative: 4 %
HCT: 33 % — ABNORMAL LOW (ref 36.0–46.0)
Hemoglobin: 10.2 g/dL — ABNORMAL LOW (ref 12.0–15.0)
Immature Granulocytes: 0 %
Lymphocytes Relative: 13 %
Lymphs Abs: 0.8 10*3/uL (ref 0.7–4.0)
MCH: 30.3 pg (ref 26.0–34.0)
MCHC: 30.9 g/dL (ref 30.0–36.0)
MCV: 97.9 fL (ref 80.0–100.0)
Monocytes Absolute: 0.6 10*3/uL (ref 0.1–1.0)
Monocytes Relative: 10 %
Neutro Abs: 4.4 10*3/uL (ref 1.7–7.7)
Neutrophils Relative %: 72 %
Platelets: 251 10*3/uL (ref 150–400)
RBC: 3.37 MIL/uL — ABNORMAL LOW (ref 3.87–5.11)
RDW: 12.9 % (ref 11.5–15.5)
WBC: 6.2 10*3/uL (ref 4.0–10.5)
nRBC: 0 % (ref 0.0–0.2)

## 2023-08-22 LAB — VITAMIN D 25 HYDROXY (VIT D DEFICIENCY, FRACTURES): Vit D, 25-Hydroxy: 77.13 ng/mL (ref 30–100)

## 2023-08-23 LAB — CANCER ANTIGEN 15-3: CA 15-3: 22 U/mL (ref 0.0–25.0)

## 2023-09-12 ENCOUNTER — Other Ambulatory Visit

## 2023-09-12 ENCOUNTER — Inpatient Hospital Stay: Payer: Medicare Other

## 2023-09-19 ENCOUNTER — Inpatient Hospital Stay (HOSPITAL_BASED_OUTPATIENT_CLINIC_OR_DEPARTMENT_OTHER): Payer: Medicare Other | Admitting: Hematology

## 2023-09-19 VITALS — BP 132/82 | HR 72 | Temp 97.7°F | Resp 16 | Wt 119.3 lb

## 2023-09-19 DIAGNOSIS — E559 Vitamin D deficiency, unspecified: Secondary | ICD-10-CM | POA: Diagnosis not present

## 2023-09-19 DIAGNOSIS — C50412 Malignant neoplasm of upper-outer quadrant of left female breast: Secondary | ICD-10-CM

## 2023-09-19 DIAGNOSIS — Z1231 Encounter for screening mammogram for malignant neoplasm of breast: Secondary | ICD-10-CM | POA: Diagnosis not present

## 2023-09-19 DIAGNOSIS — Z170 Estrogen receptor positive status [ER+]: Secondary | ICD-10-CM

## 2023-09-19 DIAGNOSIS — C50912 Malignant neoplasm of unspecified site of left female breast: Secondary | ICD-10-CM | POA: Diagnosis not present

## 2023-09-19 NOTE — Patient Instructions (Signed)
 [facility] at [facility] Discharge Instructions   You were seen and examined today by Dr. Cheree Cords.  He reviewed the results of your lab work which are mostly normal/stable. Your hemoglobin is low. Dr. Linnell Richardson wants you to take an iron pill (325 mg) daily.   He reviewed the results of your CT scan which did no show any evidence of cancer.   We will see you back in October after the mammogram.   Return as scheduled.    Thank you for choosing [facility] at [facility] to provide your oncology and hematology care.  To afford each patient quality time with our provider, please arrive at least 15 minutes before your scheduled appointment time.   If you have a lab appointment with the Cancer Center please come in thru the Main Entrance and check in at the main information desk.  You need to re-schedule your appointment should you arrive 10 or more minutes late.  We strive to give you quality time with our providers, and arriving late affects you and other patients whose appointments are after yours.  Also, if you no show three or more times for appointments you may be dismissed from the clinic at the providers discretion.     Again, thank you for choosing [facility].  Our hope is that these requests will decrease the amount of time that you wait before being seen by our physicians.       _____________________________________________________________  Should you have questions after your visit to [facility], please contact our office at [phone] and follow the prompts.  Our office hours are 8:00 a.m. and 4:30 p.m. Monday - Friday.  Please note that voicemails left after 4:00 p.m. may not be returned until the following business day.  We are closed weekends and major holidays.  You do have access to a nurse 24-7, just call the main number to the clinic [phone] and do not press any options, hold on the line and a nurse  will answer the phone.    For prescription refill requests, have your pharmacy contact our office and allow 72 hours.    Due to Covid, you will need to wear a mask upon entering the hospital. If you do not have a mask, a mask will be given to you at the Main Entrance upon arrival. For doctor visits, patients may have 1 support person age 2 or older with them. For treatment visits, patients can not have anyone with them due to social distancing guidelines and our immunocompromised population.

## 2023-09-19 NOTE — Progress Notes (Signed)
 [facility] [address] [place] [address]    Clinic Day:  09/19/2023  Referring physician: Tobi Fortes, MD  Patient Care Team: Tobi Fortes, MD as PCP - General (Internal Medicine) Paulett Boros, MD as Medical Oncologist (Medical Oncology)   ASSESSMENT & PLAN:   Assessment: 1.  Stage I (T1CN0) invasive lobular carcinoma of the left breast: -Biopsy on 02/28/2019 [facility] consistent with invasive lobular carcinoma 12:30 position, grade 1, ER positive, PR negative and HER-2 negative by FISH. -Lumpectomy and sentinel lymph node biopsy on 04/23/2019 by Dr. Delane Fear shows grade 1, 1.5 cm invasive lobular carcinoma, margins negative, 1 sentinel lymph node negative, ER more than 90%, PR negative, HER-2 negative, Ki-67 not done.  PT1CPN0. -Anastrozole  started around 05/14/2019. -She was seen by radiation oncology in [place].  As the absolute reduction in local recurrence was low at 3% with radiation, patient opted to not to undergo XRT.   2.  Stage III (T3 N2 M0) non-small cell lung cancer, adenocarcinoma type: -She was treated with chemoradiation therapy with cisplatin VP-16 from 02/18/2008 through 04/25/2008. -CT chest on 02/25/2019 showed scarring in the right upper medial lung consistent with history of prior treatment.  3 cm left lower lobe lung cyst is unchanged.  No adenopathy. - CT chest (09/08/2020): Done at [facility] did not show any evidence of recurrence.  Bilateral patchy infiltrates suggestive of infectious process.   3.  Osteoporosis: -Started on Prolia in September 2020, receives in [place] .  This was prescribed by her rheumatologist in [place]. -She will continue calcium and vitamin D  supplements.    Plan: 1.  Stage I (T1CN0) invasive lobular carcinoma of the left breast: - She is tolerating anastrozole  very well. - Mammogram on 03/13/2023: BI-RADS Category 2. - Breast exam was not performed today.   Labs from 08/22/2023 reviewed by me. - Labs on 08/22/2023: Normal LFTs.  CBC shows mild anemia with hemoglobin 10.2.  Previous ferritin 6 months ago was 85.  I have recommended that she may start taking iron tablet 3 times a week. - Recommend follow-up in 6 months.  Talked about BCI testing to see if she needs extended duration antiestrogen therapy.  She is agreeable.  Will order the test prior to next visit.  She will have bilateral mammogram after 03/12/2024.   2.  Stage III (T3 N2 M0) non-small cell lung cancer, adenocarcinoma type: - We reviewed CT chest without contrast from 08/22/2023: Stable exam with stable radiation fibrosis in the right apex and suprahilar right medial lung.  Multiple small bilateral pulmonary nodules have not changed from 03/13/2023. - Will consider repeat imaging CT chest without contrast in 1 year.  If the lung nodules are stable at that point, no need for further imaging.   3.  Osteoporosis: - Continue Prolia and annual under the direction of Dr. Alva Jewels of rheumatology.  Last vitamin D  is normal.  Vitamin D  level is 77.   4.  Rheumatoid arthritis: - Continue Arava, Plaquenil and sulfasalazine.    Orders Placed This Encounter  Procedures   MM 3D SCREENING MAMMOGRAM BILATERAL BREAST    Per office: No implants NAS No devices    Standing Status:   Future    Expected Date:   03/05/2024    Expiration Date:   09/18/2024    Reason for Exam (SYMPTOM  OR DIAGNOSIS REQUIRED):   breast cancer screening    Preferred imaging location?:   [facility]   CBC  with Differential    Standing Status:   Future    Expected Date:   03/12/2024    Expiration Date:   09/18/2024   Comprehensive metabolic panel    Standing Status:   Future    Expected Date:   03/12/2024    Expiration Date:   09/18/2024   VITAMIN D  25 Hydroxy (Vit-D Deficiency, Fractures)    Standing Status:   Future    Expected Date:   03/12/2024    Expiration Date:   09/18/2024   Cancer antigen 15-3     Standing Status:   Future    Expected Date:   03/12/2024    Expiration Date:   09/18/2024     Hurman Maiden R Teague,acting as a scribe for Paulett Boros, MD.,have documented all relevant documentation on the behalf of Paulett Boros, MD,as directed by  Paulett Boros, MD while in the presence of Paulett Boros, MD.  I, Paulett Boros MD, have reviewed the above documentation for accuracy and completeness, and I agree with the above.    Paulett Boros, MD   4/29/20252:46 PM  CHIEF COMPLAINT:   Diagnosis: left breast cancer    Cancer Staging  Breast cancer, left [facility]) Staging form: Breast, AJCC 8th Edition - Clinical stage from 05/14/2019: Stage IA (cT1c, cN0, cM0, G1, ER+, PR-, HER2-) - Signed by Paulett Boros, MD on 05/14/2019    Prior Therapy: Lumpectomy and SLNB on 04/23/2019  Current Therapy:  anastrozole    HISTORY OF PRESENT ILLNESS:   Oncology History  Breast cancer, left (HCC)  04/01/2019 Initial Diagnosis   Breast cancer, left (HCC)   05/14/2019 Cancer Staging   Staging form: Breast, AJCC 8th Edition - Clinical stage from 05/14/2019: Stage IA (cT1c, cN0, cM0, G1, ER+, PR-, HER2-) - Signed by Paulett Boros, MD on 05/14/2019      INTERVAL HISTORY:   Christy Bennett is a 68 y.o. female presenting to clinic today for follow up of left breast cancer. She was last seen by me on 03/21/23.  Since her last visit, she underwent CT chest on 08/22/23 that found: Stable exam. No new or progressive findings. 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 Multiple bilateral pulmonary nodules are not substantially changed in the interval. Similar sclerotic changes in the right upper ribs and right scapula presumably treatment related. Bilateral nonobstructing nephrolithiasis.  Today, she states that she is doing well overall. Her appetite level is at 100%. Her energy level is at 75%. Myley is accompanied by her  wife.  She does not currently take iron supplements and has not taken any since she was in high school.   Erionna started Anastrozole  in 04/2019 and is tolerating it well. She is taking Arava, Plaquenil and sulfasalazine for rheumatoid arthritis as prescribed. She continues to take Prolia injections with her rheumatologist.   PAST MEDICAL HISTORY:   Past Medical History: Past Medical History:  Diagnosis Date   Allergy    Anxiety    Breast cancer (HCC) 04/2019   Clotting disorder (HCC)    Im on a blood thinner and bruise easily   Complication of anesthesia    difficulty waking up after anesthesia   COPD (chronic obstructive pulmonary disease) (HCC)    Depression    Emphysema of lung (HCC)    Encounter for well adult exam with abnormal findings 01/04/2023   GERD (gastroesophageal reflux disease)    Hyperlipemia    Hypertension    Hypothyroid    Lung cancer (HCC) 2009  Neuromuscular disorder (HCC)    Osteoporosis    PVD (peripheral vascular disease) (HCC)    RA (rheumatoid arthritis) (HCC)    Tremors of nervous system     Surgical History: Past Surgical History:  Procedure Laterality Date   BIOPSY  11/22/2022   Procedure: BIOPSY;  Surgeon: Urban Garden, MD;  [facility];  Service: Gastroenterology;;   BREAST LUMPECTOMY Left 04/23/2019   Invasive lobular carcinoma/Calcifications associated with carcinoma/ Margins uninvolved   BREAST LUMPECTOMY WITH RADIOACTIVE SEED AND SENTINEL LYMPH NODE BIOPSY Left 04/23/2019   Procedure: LEFT BREAST LUMPECTOMY WITH RADIOACTIVE SEED AND LEFT AXILLARY SENTINEL LYMPH NODE BIOPSY;  Surgeon: Enid Harry, MD;  Location: [facility];  Service: General;  Laterality: Left;   CATARACT EXTRACTION, BILATERAL     ESOPHAGEAL DILATION Left 11/22/2022   Procedure: ESOPHAGEAL DILATION;  Surgeon: Urban Garden, MD;  [facility]: [facility];  Service: Gastroenterology;  Laterality: Left;  11:15am;asa  3   ESOPHAGOGASTRODUODENOSCOPY (EGD) WITH PROPOFOL  N/A 11/22/2022   Procedure: ESOPHAGOGASTRODUODENOSCOPY (EGD) WITH PROPOFOL ;  Surgeon: Urban Garden, MD;  [facility];  Service: Gastroenterology;  Laterality: N/A;  11:15am;asa 3   EYE SURGERY     Cataract surgery x2   FEMORAL BYPASS Right 2017   x3   KIDNEY STONE SURGERY      Social History: Social History   Socioeconomic History   Marital status: Married    Spouse name: Susana Enter   Number of children: Not on file   Years of education: Not on file   Highest education level: Some college, no degree  Occupational History   Occupation: retires  Tobacco Use   Smoking status: Former    Current packs/day: 0.00    Average packs/day: 1 pack/day for 43.0 years (43.0 ttl pk-yrs)    Types: Cigarettes    Start date: 04/11/1972    Quit date: 04/12/2015    Years since quitting: 8.4    Passive exposure: Past   Smokeless tobacco: Never  Vaping Use   Vaping status: Never Used  Substance and Sexual Activity   Alcohol use: Not Currently    Comment: occasionally   Drug use: Never   Sexual activity: Yes    Birth control/protection: Post-menopausal  Other Topics Concern   Not on file  Social History Narrative   Lives with husband.   Social Drivers of Corporate investment banker Strain: Low Risk  (07/10/2023)   Overall Financial Resource Strain (CARDIA)    Difficulty of Paying Living Expenses: Not very hard  Food Insecurity: No Food Insecurity (07/10/2023)   Hunger Vital Sign    Worried About Running Out of Food in the Last Year: Never true    Ran Out of Food in the Last Year: Never true  Transportation Needs: No Transportation Needs (07/10/2023)   PRAPARE - Administrator, Civil Service (Medical): No    Lack of Transportation (Non-Medical): No  Physical Activity: Insufficiently Active (07/10/2023)   Exercise Vital Sign    Days of Exercise per Week: 2 days    Minutes of Exercise per Session: 10 min   Stress: No Stress Concern Present (07/10/2023)   [employer] of Occupational Health - Occupational Stress Questionnaire    Feeling of Stress : Only a little  Social Connections: Socially Integrated (07/10/2023)   Social Connection and Isolation Panel [NHANES]    Frequency of Communication with Friends and Family: Twice a week    Frequency of Social Gatherings with  Friends and Family: Once a week    Attends Religious Services: More than 4 times per year    Active Member of [employer] or Organizations: Yes    Attends Banker Meetings: 1 to 4 times per year    Marital Status: Married  Catering manager Violence: Not At Risk (10/20/2022)   Humiliation, Afraid, Rape, and Kick questionnaire    Fear of Current or Ex-Partner: No    Emotionally Abused: No    Physically Abused: No    Sexually Abused: No    Family History: Family History  Problem Relation Age of Onset   Diabetes Mother    Hyperlipidemia Mother    Arthritis Mother    Heart disease Mother    Hypertension Mother    Kidney disease Mother    Lung cancer Maternal Aunt     Current Medications:  Current Outpatient Medications:    albuterol  (PROAIR  HFA) 108 (90 Base) MCG/ACT inhaler, Inhale 2 puffs into the lungs every 4 (four) hours as needed for shortness of breath or wheezing., Disp: 18 g, Rfl: 1   anastrozole  (ARIMIDEX ) 1 MG tablet, Take 1 tablet (1 mg total) by mouth daily., Disp: 90 tablet, Rfl: 3   arformoterol  (BROVANA ) 15 MCG/2ML NEBU, Take 2 mLs (15 mcg total) by nebulization in the morning and at bedtime., Disp: 120 mL, Rfl: 4   ascorbic acid (VITAMIN C) 500 MG tablet, Take 500 mg by mouth daily., Disp: , Rfl:    aspirin EC 81 MG tablet, Take 81 mg by mouth daily., Disp: , Rfl:    b complex vitamins capsule, Take 1 capsule by mouth daily., Disp: , Rfl:    buPROPion  (WELLBUTRIN  SR) 150 MG 12 hr tablet, Take 1 tablet by mouth twice daily, Disp: 180 tablet, Rfl: 0   Calcium Carb-Cholecalciferol (CALCIUM  600/VITAMIN D  PO), Take 1 tablet by mouth. 1200 one daily, Disp: , Rfl:    cetirizine (ZYRTEC) 10 MG tablet, Take 10 mg by mouth daily., Disp: , Rfl:    clopidogrel  (PLAVIX ) 75 MG tablet, Take 1 tablet by mouth once daily, Disp: 90 tablet, Rfl: 0   denosumab (PROLIA) 60 MG/ML SOSY injection, Inject 60 mg into the skin every 6 (six) months., Disp: , Rfl:    diclofenac Sodium (VOLTAREN) 1 % GEL, Apply 2 g topically 3 (three) times daily as needed (pain)., Disp: , Rfl:    docusate sodium (COLACE) 50 MG capsule, Take 50 mg by mouth as needed for mild constipation., Disp: , Rfl:    hydroxychloroquine (PLAQUENIL) 200 MG tablet, Take 300 mg by mouth daily., Disp: , Rfl:    hydroxypropyl methylcellulose / hypromellose (ISOPTO TEARS / GONIOVISC) 2.5 % ophthalmic solution, Place 1 drop into both eyes as needed for dry eyes., Disp: , Rfl:    ibuprofen (ADVIL) 600 MG tablet, Take 600 mg by mouth every 6 (six) hours as needed for moderate pain., Disp: , Rfl:    leflunomide (ARAVA) 20 MG tablet, Take 20 mg by mouth daily., Disp: , Rfl:    levothyroxine  (SYNTHROID ) 137 MCG tablet, Take 1 tablet by mouth once daily, Disp: 90 tablet, Rfl: 0   losartan  (COZAAR ) 25 MG tablet, Take 1 tablet (25 mg total) by mouth daily., Disp: 90 tablet, Rfl: 3   meclizine (ANTIVERT) 25 MG tablet, Take 25 mg by mouth 3 (three) times daily as needed for dizziness., Disp: , Rfl:    revefenacin (YUPELRI) 175 MCG/3ML nebulizer solution, Take 175 mcg by nebulization daily., Disp: , Rfl:  simvastatin  (ZOCOR ) 10 MG tablet, Take 1 tablet by mouth once daily, Disp: 90 tablet, Rfl: 0   sulfaSALAzine (AZULFIDINE) 500 MG tablet, Take 500-1,000 mg by mouth See admin instructions. Taking 500 mg by mouth in the morning and 1000 mg in the evening, Disp: , Rfl:    traZODone  (DESYREL ) 150 MG tablet, Take 1 tablet (150 mg total) by mouth at bedtime., Disp: 90 tablet, Rfl: 1   valACYclovir  (VALTREX ) 500 MG tablet, Take 4 tablets (2,000 mg total) by  mouth 2 (two) times daily as needed (fever blisters)., Disp: 20 tablet, Rfl: 1   verapamil  (VERELAN ) 240 MG 24 hr capsule, Take 240 mg by mouth daily., Disp: , Rfl:    Vitamin E 268 MG (400 UNIT) TABS, Take 400 Units by mouth daily., Disp: , Rfl:    Allergies: Allergies  Allergen Reactions   Amoxicillin-Pot Clavulanate Diarrhea, Nausea And Vomiting and Nausea Only    abdominal pain    REVIEW OF SYSTEMS:   Review of Systems  Constitutional:  Negative for chills, fatigue and fever.  HENT:   Negative for lump/mass, mouth sores, nosebleeds, sore throat and trouble swallowing.   Eyes:  Negative for eye problems.  Respiratory:  Positive for cough. Negative for shortness of breath.   Cardiovascular:  Negative for chest pain, leg swelling and palpitations.  Gastrointestinal:  Positive for constipation. Negative for abdominal pain, diarrhea, nausea and vomiting.  Genitourinary:  Negative for bladder incontinence, difficulty urinating, dysuria, frequency, hematuria and nocturia.   Musculoskeletal:  Negative for arthralgias, back pain, flank pain, myalgias and neck pain.  Skin:  Negative for itching and rash.  Neurological:  Negative for dizziness, headaches and numbness.  Hematological:  Does not bruise/bleed easily.  Psychiatric/Behavioral:  Negative for depression, sleep disturbance and suicidal ideas. The patient is not nervous/anxious.   All other systems reviewed and are negative.    VITALS:   Blood pressure 132/82, pulse 72, temperature 97.7 F (36.5 C), temperature source Oral, resp. rate 16, weight 119 lb 4.3 oz (54.1 kg), SpO2 98%.  Wt Readings from Last 3 Encounters:  09/19/23 119 lb 4.3 oz (54.1 kg)  07/27/23 122 lb 9.6 oz (55.6 kg)  07/10/23 118 lb 12.8 oz (53.9 kg)    Body mass index is 21.81 kg/m.  Performance status (ECOG): 1 - Symptomatic but completely ambulatory  PHYSICAL EXAM:   Physical Exam Vitals and nursing note reviewed. Exam conducted with a chaperone  present.  Constitutional:      Appearance: Normal appearance.  Cardiovascular:     Rate and Rhythm: Normal rate and regular rhythm.     Pulses: Normal pulses.     Heart sounds: Normal heart sounds.  Pulmonary:     Effort: Pulmonary effort is normal.     Breath sounds: Normal breath sounds.  Abdominal:     Palpations: Abdomen is soft. There is no hepatomegaly, splenomegaly or mass.     Tenderness: There is no abdominal tenderness.  Musculoskeletal:     Right lower leg: No edema.     Left lower leg: No edema.  Lymphadenopathy:     Cervical: No cervical adenopathy.     Right cervical: No superficial, deep or posterior cervical adenopathy.    Left cervical: No superficial, deep or posterior cervical adenopathy.     Upper Body:     Right upper body: No supraclavicular or axillary adenopathy.     Left upper body: No supraclavicular or axillary adenopathy.  Neurological:     General: No  focal deficit present.     Mental Status: She is alert and oriented to person, place, and time.  Psychiatric:        Mood and Affect: Mood normal.        Behavior: Behavior normal.   Breast Exam Chaperone: Reed Canes, LPN   LABS:      Latest Ref Rng & Units 08/22/2023    9:16 AM 03/13/2023    1:52 PM 01/04/2023    1:42 PM  CBC  WBC 4.0 - 10.5 K/uL 6.2  5.1  6.0   Hemoglobin 12.0 - 15.0 g/dL 78.2  95.6  21.3   Hematocrit 36.0 - 46.0 % 33.0  33.3  32.9   Platelets 150 - 400 K/uL 251  244  273       Latest Ref Rng & Units 08/22/2023    9:16 AM 03/13/2023    1:52 PM 01/04/2023    1:42 PM  CMP  Glucose 70 - 99 mg/dL 99  086  68   BUN 8 - 23 mg/dL 15  15  13    Creatinine 0.44 - 1.00 mg/dL 5.78  4.69  6.29   Sodium 135 - 145 mmol/L 138  138  140   Potassium 3.5 - 5.1 mmol/L 4.1  3.5  4.1   Chloride 98 - 111 mmol/L 102  104  104   CO2 22 - 32 mmol/L 27  25  22    Calcium 8.9 - 10.3 mg/dL 9.7  8.9  9.1   Total Protein 6.5 - 8.1 g/dL 5.2  5.2  5.9   Total Bilirubin 0.0 - 1.2 mg/dL 0.9  0.6   0.7   Alkaline Phos 38 - 126 U/L 89  86  84   AST 15 - 41 U/L 20  24  19    ALT 0 - 44 U/L 28  27  19       No results found for: "CEA1", "CEA" / No results found for: "CEA1", "CEA" No results found for: "PSA1" No results found for: "BMW413" Lab Results  Component Value Date   CAN125 39.0 (H) 09/16/2020    No results found for: "TOTALPROTELP", "ALBUMINELP", "A1GS", "A2GS", "BETS", "BETA2SER", "GAMS", "MSPIKE", "SPEI" Lab Results  Component Value Date   TIBC 328 03/21/2023   FERRITIN 85 03/21/2023   IRONPCTSAT 17 03/21/2023   No results found for: "LDH"   STUDIES:   CT Chest Wo Contrast Result Date: 09/04/2023 CLINICAL DATA:  Lung cancer restaging.  * Tracking Code: BO * EXAM: CT CHEST WITHOUT CONTRAST TECHNIQUE: Multidetector CT imaging of the chest was performed following the standard protocol without IV contrast. RADIATION DOSE REDUCTION: This exam was performed according to the departmental dose-optimization program which includes automated exposure control, adjustment of the mA and/or kV according to patient size and/or use of iterative reconstruction technique. COMPARISON:  03/13/2023 FINDINGS: Cardiovascular: The heart size is normal. No substantial pericardial effusion. Coronary artery calcification is evident. Mild atherosclerotic calcification is noted in the wall of the thoracic aorta. Mediastinum/Nodes: No mediastinal lymphadenopathy. No evidence for gross hilar lymphadenopathy although assessment is limited by the lack of intravenous contrast on the current study. The esophagus has normal imaging features. There is no axillary lymphadenopathy. Lungs/Pleura: Centrilobular and paraseptal emphysema evident. Pleuroparenchymal scarring in the right apex and suprahilar scarring in the right medial lung are stable in the interval consistent with radiation fibrosis. Multiple bilateral pulmonary nodules are not substantially changed in the interval. Index posterior 4 mm right lung nodule  on 45/3  was 4 mm previously. 5 mm peripheral left upper lobe nodule on 64/3 was 5 mm previously (remeasured). 9 mm nodule posterior right lung base (119/3) was 9 mm previously. Numerous additional bilateral tiny pulmonary nodules are similar to prior. No focal airspace consolidation. No pleural effusion. Upper Abdomen: Multiple nonobstructing stones are seen in both kidneys although neither kidney has been incompletely visualized. Musculoskeletal: Sclerotic changes in the right first second third and fourth ribs are stable. Sclerotic change in the right scapula posterior to these ribs is also similar. IMPRESSION: 1. Stable exam. No new or progressive findings. 2. 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 3. Multiple bilateral pulmonary nodules are not substantially changed in the interval. Continued follow-up warranted. 4. Similar sclerotic changes in the right upper ribs and right scapula presumably treatment related. 5. Bilateral nonobstructing nephrolithiasis. Electronically Signed   By: Donnal Fusi M.D.   On: 09/04/2023 08:15

## 2023-10-13 ENCOUNTER — Other Ambulatory Visit: Payer: Self-pay | Admitting: Internal Medicine

## 2023-10-18 ENCOUNTER — Other Ambulatory Visit: Payer: Self-pay | Admitting: Internal Medicine

## 2023-10-23 ENCOUNTER — Ambulatory Visit (INDEPENDENT_AMBULATORY_CARE_PROVIDER_SITE_OTHER): Payer: Medicare Other

## 2023-10-23 VITALS — BP 102/64 | HR 69 | Ht 62.0 in | Wt 118.0 lb

## 2023-10-23 DIAGNOSIS — Z0000 Encounter for general adult medical examination without abnormal findings: Secondary | ICD-10-CM | POA: Diagnosis not present

## 2023-10-23 NOTE — Patient Instructions (Signed)
 Christy Bennett , Thank you for taking time out of your busy schedule to complete your Annual Wellness Visit with me. I enjoyed our conversation and look forward to speaking with you again next year. I, as well as your care team,  appreciate your ongoing commitment to your health goals. Please review the following plan we discussed and let me know if I can assist you in the future. Your Game plan/ To Do List    Referrals: If you haven't heard from the office you've been referred to, please reach out to them at the phone number provided.  An order for a bone density scan was placed for you today Please call the number below to schedule your appt. [facility] Phone: [phone] Make sure to wear two-piece clothing.  If you're having a mammogram, please remember: No lotions,powders, or deodorants the day of the appointment Make sure to bring picture ID and insurance card.  Bring list of medications you are currently taking including any supplements.  If you are having a bone density scan, please discontinue any medications that contain calcium at least 48 hours (2 days) prior to your scan.    Follow up Visits: Next Medicare AWV with our clinical staff:   October 23, 2024 at 9:20 am telephone visit Have you seen your provider in the last 6 months (3 months if uncontrolled diabetes)? Yes Next Office Visit with your provider:   January 09, 2024 at 2:20pm  Clinician Recommendations:   Aim for 30 minutes of exercise or brisk walking, 6-8 glasses of water, and 5 servings of fruits and vegetables each day.  I enjoyed our conversation today and look forward to talking with you again next year!! Have a wonderful and safe year. All the best, Zamora Colton This is a list of the screening recommended for you and due dates:  Health Maintenance  Topic Date Due   Zoster (Shingles) Vaccine (2 of 2) 10/13/2020   COVID-19 Vaccine (5 - 2024-25 season) 01/22/2023   Flu Shot  12/22/2023   DEXA scan  (bone density measurement)  03/11/2024   Medicare Annual Wellness Visit  10/22/2024   Mammogram  03/12/2025   DTaP/Tdap/Td vaccine (3 - Td or Tdap) 02/26/2026   Colon Cancer Screening  11/03/2030   Pneumonia Vaccine  Completed   Hepatitis C Screening  Completed   HPV Vaccine  Aged Out   Meningitis B Vaccine  Aged Out   Screening for Lung Cancer  Discontinued    Advanced directives: (Declined) Advance directive discussed with you today. Even though you declined this today, please call our office should you change your mind, and we can give you the proper paperwork for you to fill out. Advance Care Planning is important because it:  [x]  Makes sure you receive the medical care that is consistent with your values, goals, and preferences  [x]  It provides guidance to your family and loved ones and reduces their decisional burden about whether or not they are making the right decisions based on your wishes.  Follow the link provided in your after visit summary or read over the paperwork we have mailed to you to help you started getting your Advance Directives in place. If you need assistance in completing these, please reach out to us  so that we can help you! See attachments for Preventive Care and Fall Prevention Tips.  Understanding Your Risk for Falls Millions of people have serious injuries from falls each year. It is important to understand  your risk of falling. Talk with your health care provider about your risk and what you can do to lower it. If you do have a serious fall, make sure to tell your provider. Falling once raises your risk of falling again. How can falls affect me? Serious injuries from falls are common. These include: Broken bones, such as hip fractures. Head injuries, such as traumatic brain injuries (TBI) or concussions. A fear of falling can cause you to avoid activities and stay at home. This can make your muscles weaker and raise your risk for a fall. What can increase  my risk? There are a number of risk factors that increase your risk for falling. The more risk factors you have, the higher your risk of falling. Serious injuries from a fall happen most often to people who are older than 70 years old. Teenagers and young adults ages 39-29 are also at higher risk. Common risk factors include: Weakness in the lower body. Being generally weak or confused due to long-term (chronic) illness. Dizziness or balance problems. Poor vision. Medicines that cause dizziness or drowsiness. These may include: Medicines for your blood pressure, heart, anxiety, insomnia, or swelling (edema). Pain medicines. Muscle relaxants. Other risk factors include: Drinking alcohol. Having had a fall in the past. Having foot pain or wearing improper footwear. Working at a dangerous job. Having any of the following in your home: Tripping hazards, such as floor clutter or loose rugs. Poor lighting. Pets. Having dementia or memory loss. What actions can I take to lower my risk of falling?     Physical activity Stay physically fit. Do strength and balance exercises. Consider taking a regular class to build strength and balance. Yoga and tai chi are good options. Vision Have your eyes checked every year and your prescription for glasses or contacts updated as needed. Shoes and walking aids Wear non-skid shoes. Wear shoes that have rubber soles and low heels. Do not wear high heels. Do not walk around the house in socks or slippers. Use a cane or walker as told by your provider. Home safety Attach secure railings on both sides of your stairs. Install grab bars for your bathtub, shower, and toilet. Use a non-skid mat in your bathtub or shower. Attach bath mats securely with double-sided, non-slip rug tape. Use good lighting in all rooms. Keep a flashlight near your bed. Make sure there is a clear path from your bed to the bathroom. Use night-lights. Do not use throw rugs. Make  sure all carpeting is taped or tacked down securely. Remove all clutter from walkways and stairways, including extension cords. Repair uneven or broken steps and floors. Avoid walking on icy or slippery surfaces. Walk on the grass instead of on icy or slick sidewalks. Use ice melter to get rid of ice on walkways in the winter. Use a cordless phone. Questions to ask your health care provider Can you help me check my risk for a fall? Do any of my medicines make me more likely to fall? Should I take a vitamin D  supplement? What exercises can I do to improve my strength and balance? Should I make an appointment to have my vision checked? Do I need a bone density test to check for weak bones (osteoporosis)? Would it help to use a cane or a walker? Where to find more information Centers for Disease Control and Prevention, [employer]: TonerPromos.no Community-Based Fall Prevention Programs: TonerPromos.no [employer] on Aging: BaseRingTones.pl Contact a health care provider if: You fall at  home. You are afraid of falling at home. You feel weak, drowsy, or dizzy. This information is not intended to replace advice given to you by your health care provider. Make sure you discuss any questions you have with your health care provider. Document Revised: 01/10/2022 Document Reviewed: 01/10/2022 Elsevier Patient Education  2024 [employer].  Understanding Your Risk for Falls Millions of people have serious injuries from falls each year. It is important to understand your risk of falling. Talk with your health care provider about your risk and what you can do to lower it. If you do have a serious fall, make sure to tell your provider. Falling once raises your risk of falling again. How can falls affect me? Serious injuries from falls are common. These include: Broken bones, such as hip fractures. Head injuries, such as traumatic brain injuries (TBI) or concussions. A fear of falling can cause you to avoid  activities and stay at home. This can make your muscles weaker and raise your risk for a fall. What can increase my risk? There are a number of risk factors that increase your risk for falling. The more risk factors you have, the higher your risk of falling. Serious injuries from a fall happen most often to people who are older than 70 years old. Teenagers and young adults ages 30-29 are also at higher risk. Common risk factors include: Weakness in the lower body. Being generally weak or confused due to long-term (chronic) illness. Dizziness or balance problems. Poor vision. Medicines that cause dizziness or drowsiness. These may include: Medicines for your blood pressure, heart, anxiety, insomnia, or swelling (edema). Pain medicines. Muscle relaxants. Other risk factors include: Drinking alcohol. Having had a fall in the past. Having foot pain or wearing improper footwear. Working at a dangerous job. Having any of the following in your home: Tripping hazards, such as floor clutter or loose rugs. Poor lighting. Pets. Having dementia or memory loss. What actions can I take to lower my risk of falling?     Physical activity Stay physically fit. Do strength and balance exercises. Consider taking a regular class to build strength and balance. Yoga and tai chi are good options. Vision Have your eyes checked every year and your prescription for glasses or contacts updated as needed. Shoes and walking aids Wear non-skid shoes. Wear shoes that have rubber soles and low heels. Do not wear high heels. Do not walk around the house in socks or slippers. Use a cane or walker as told by your provider. Home safety Attach secure railings on both sides of your stairs. Install grab bars for your bathtub, shower, and toilet. Use a non-skid mat in your bathtub or shower. Attach bath mats securely with double-sided, non-slip rug tape. Use good lighting in all rooms. Keep a flashlight near your  bed. Make sure there is a clear path from your bed to the bathroom. Use night-lights. Do not use throw rugs. Make sure all carpeting is taped or tacked down securely. Remove all clutter from walkways and stairways, including extension cords. Repair uneven or broken steps and floors. Avoid walking on icy or slippery surfaces. Walk on the grass instead of on icy or slick sidewalks. Use ice melter to get rid of ice on walkways in the winter. Use a cordless phone. Questions to ask your health care provider Can you help me check my risk for a fall? Do any of my medicines make me more likely to fall? Should I take a vitamin D   supplement? What exercises can I do to improve my strength and balance? Should I make an appointment to have my vision checked? Do I need a bone density test to check for weak bones (osteoporosis)? Would it help to use a cane or a walker? Where to find more information Centers for Disease Control and Prevention, [employer]: TonerPromos.no Community-Based Fall Prevention Programs: TonerPromos.no [employer] on Aging: BaseRingTones.pl Contact a health care provider if: You fall at home. You are afraid of falling at home. You feel weak, drowsy, or dizzy. This information is not intended to replace advice given to you by your health care provider. Make sure you discuss any questions you have with your health care provider. Document Revised: 01/10/2022 Document Reviewed: 01/10/2022 Elsevier Patient Education  2024 [employer].

## 2023-10-23 NOTE — Progress Notes (Signed)
 Subjective:   Christy Bennett is a 68 y.o. who presents for a Medicare Wellness preventive visit.  As a reminder, Annual Wellness Visits don't include a physical exam, and some assessments may be limited, especially if this visit is performed virtually. We may recommend an in-person follow-up visit with your provider if needed.  Visit Complete: In person  Persons Participating in Visit: Patient.  AWV Questionnaire: No: Patient Medicare AWV questionnaire was not completed prior to this visit.  Cardiac Risk Factors include: advanced age (>61men, >3 women);hypertension     Objective:     Today's Vitals   10/23/23 1258  BP: 102/64  Pulse: 69  SpO2: 97%  Weight: 118 lb (53.5 kg)  Height: 5\' 2"  (1.575 m)   Body mass index is 21.58 kg/m.     10/23/2023    1:18 PM 09/19/2023   11:09 AM 03/21/2023    3:12 PM 11/22/2022    9:44 AM 11/18/2022    9:18 AM 10/20/2022    2:33 PM 09/19/2022    3:00 PM  Advanced Directives  Does Patient Have a Medical Advance Directive? No No No No No No No  Would patient like information on creating a medical advance directive? No - Patient declined No - Patient declined No - Patient declined No - Patient declined No - Patient declined No - Patient declined No - Patient declined    Current Medications (verified) Outpatient Encounter Medications as of 10/23/2023  Medication Sig   albuterol  (PROAIR  HFA) 108 (90 Base) MCG/ACT inhaler Inhale 2 puffs into the lungs every 4 (four) hours as needed for shortness of breath or wheezing.   anastrozole  (ARIMIDEX ) 1 MG tablet Take 1 tablet (1 mg total) by mouth daily.   arformoterol  (BROVANA ) 15 MCG/2ML NEBU Take 2 mLs (15 mcg total) by nebulization in the morning and at bedtime.   ascorbic acid (VITAMIN C) 500 MG tablet Take 500 mg by mouth daily.   aspirin EC 81 MG tablet Take 81 mg by mouth daily.   b complex vitamins capsule Take 1 capsule by mouth daily.   buPROPion  (WELLBUTRIN  SR) 150 MG 12 hr tablet Take 1  tablet by mouth twice daily   Calcium Carb-Cholecalciferol (CALCIUM 600/VITAMIN D  PO) Take 1 tablet by mouth. 1200 one daily   cetirizine (ZYRTEC) 10 MG tablet Take 10 mg by mouth daily.   clopidogrel  (PLAVIX ) 75 MG tablet Take 1 tablet by mouth once daily   denosumab (PROLIA) 60 MG/ML SOSY injection Inject 60 mg into the skin every 6 (six) months.   diclofenac Sodium (VOLTAREN) 1 % GEL Apply 2 g topically 3 (three) times daily as needed (pain).   docusate sodium (COLACE) 50 MG capsule Take 50 mg by mouth as needed for mild constipation.   hydroxychloroquine (PLAQUENIL) 200 MG tablet Take 300 mg by mouth daily.   hydroxypropyl methylcellulose / hypromellose (ISOPTO TEARS / GONIOVISC) 2.5 % ophthalmic solution Place 1 drop into both eyes as needed for dry eyes.   ibuprofen (ADVIL) 600 MG tablet Take 600 mg by mouth every 6 (six) hours as needed for moderate pain.   leflunomide (ARAVA) 20 MG tablet Take 20 mg by mouth daily.   levothyroxine  (SYNTHROID ) 137 MCG tablet Take 1 tablet by mouth once daily   losartan  (COZAAR ) 25 MG tablet Take 1 tablet (25 mg total) by mouth daily.   meclizine (ANTIVERT) 25 MG tablet Take 25 mg by mouth 3 (three) times daily as needed for dizziness.   revefenacin (YUPELRI) 175  MCG/3ML nebulizer solution Take 175 mcg by nebulization daily.   simvastatin  (ZOCOR ) 10 MG tablet Take 1 tablet by mouth once daily   sulfaSALAzine (AZULFIDINE) 500 MG tablet Take 500-1,000 mg by mouth See admin instructions. Taking 500 mg by mouth in the morning and 1000 mg in the evening   traZODone  (DESYREL ) 150 MG tablet Take 1 tablet (150 mg total) by mouth at bedtime.   valACYclovir  (VALTREX ) 500 MG tablet Take 4 tablets (2,000 mg total) by mouth 2 (two) times daily as needed (fever blisters).   verapamil  (VERELAN ) 240 MG 24 hr capsule Take 1 capsule by mouth once daily   Vitamin E 268 MG (400 UNIT) TABS Take 400 Units by mouth daily.   No facility-administered encounter medications on file  as of 10/23/2023.    Allergies (verified) Amoxicillin-pot clavulanate   History: Past Medical History:  Diagnosis Date   Allergy    Anxiety    Breast cancer (HCC) 04/2019   Clotting disorder (HCC)    Im on a blood thinner and bruise easily   Complication of anesthesia    difficulty waking up after anesthesia   COPD (chronic obstructive pulmonary disease) (HCC)    Depression    Emphysema of lung (HCC)    Encounter for well adult exam with abnormal findings 01/04/2023   GERD (gastroesophageal reflux disease)    Hyperlipemia    Hypertension    Hypothyroid    Lung cancer (HCC) 2009   Neuromuscular disorder (HCC)    Osteoporosis    PVD (peripheral vascular disease) (HCC)    RA (rheumatoid arthritis) (HCC)    Tremors of nervous system    Past Surgical History:  Procedure Laterality Date   BIOPSY  11/22/2022   Procedure: BIOPSY;  Surgeon: Urban Garden, MD;  [facility]: [facility];  Service: Gastroenterology;;   BREAST LUMPECTOMY Left 04/23/2019   Invasive lobular carcinoma/Calcifications associated with carcinoma/ Margins uninvolved   BREAST LUMPECTOMY WITH RADIOACTIVE SEED AND SENTINEL LYMPH NODE BIOPSY Left 04/23/2019   Procedure: LEFT BREAST LUMPECTOMY WITH RADIOACTIVE SEED AND LEFT AXILLARY SENTINEL LYMPH NODE BIOPSY;  Surgeon: Enid Harry, MD;  Location: [facility];  Service: General;  Laterality: Left;   CATARACT EXTRACTION, BILATERAL     ESOPHAGEAL DILATION Left 11/22/2022   Procedure: ESOPHAGEAL DILATION;  Surgeon: Urban Garden, MD;  Location: [facility];  Service: Gastroenterology;  Laterality: Left;  11:15am;asa 3   ESOPHAGOGASTRODUODENOSCOPY (EGD) WITH PROPOFOL  N/A 11/22/2022   Procedure: ESOPHAGOGASTRODUODENOSCOPY (EGD) WITH PROPOFOL ;  Surgeon: Urban Garden, MD;  Location: [facility];  Service: Gastroenterology;  Laterality: N/A;  11:15am;asa 3   EYE SURGERY     Cataract surgery x2   FEMORAL BYPASS  Right 2017   x3   KIDNEY STONE SURGERY     Family History  Problem Relation Age of Onset   Diabetes Mother    Hyperlipidemia Mother    Arthritis Mother    Heart disease Mother    Hypertension Mother    Kidney disease Mother    Lung cancer Maternal Aunt    Social History   Socioeconomic History   Marital status: Married    Spouse name: Susana Enter   Number of children: Not on file   Years of education: Not on file   Highest education level: Some college, no degree  Occupational History   Occupation: retires  Tobacco Use   Smoking status: Former    Current packs/day: 0.00    Average packs/day: 1 pack/day for  43.0 years (43.0 ttl pk-yrs)    Types: Cigarettes    Start date: 04/11/1972    Quit date: 04/12/2015    Years since quitting: 8.5    Passive exposure: Past   Smokeless tobacco: Never  Vaping Use   Vaping status: Never Used  Substance and Sexual Activity   Alcohol use: Not Currently    Comment: occasionally   Drug use: Never   Sexual activity: Yes    Birth control/protection: Post-menopausal  Other Topics Concern   Not on file  Social History Narrative   Lives with husband.   Social Drivers of Corporate investment banker Strain: Low Risk  (10/23/2023)   Overall Financial Resource Strain (CARDIA)    Difficulty of Paying Living Expenses: Not hard at all  Food Insecurity: No Food Insecurity (10/23/2023)   Hunger Vital Sign    Worried About Running Out of Food in the Last Year: Never true    Ran Out of Food in the Last Year: Never true  Transportation Needs: No Transportation Needs (10/23/2023)   PRAPARE - Administrator, Civil Service (Medical): No    Lack of Transportation (Non-Medical): No  Physical Activity: Sufficiently Active (10/23/2023)   Exercise Vital Sign    Days of Exercise per Week: 7 days    Minutes of Exercise per Session: 30 min  Stress: No Stress Concern Present (10/23/2023)   [employer] of Occupational Health - Occupational Stress  Questionnaire    Feeling of Stress : Not at all  Social Connections: Moderately Integrated (10/23/2023)   Social Connection and Isolation Panel [NHANES]    Frequency of Communication with Friends and Family: Once a week    Frequency of Social Gatherings with Friends and Family: Three times a week    Attends Religious Services: More than 4 times per year    Active Member of Clubs or Organizations: No    Attends Banker Meetings: Never    Marital Status: Married    Tobacco Counseling Counseling given: Not Answered    Clinical Intake:  Pre-visit preparation completed: Yes  Pain : No/denies pain     BMI - recorded: 21.58 Nutritional Status: BMI of 19-24  Normal Nutritional Risks: None Diabetes: No  Lab Results  Component Value Date   HGBA1C <4.2 (L) 01/04/2023     How often do you need to have someone help you when you read instructions, pamphlets, or other written materials from your doctor or pharmacy?: 1 - Never  Interpreter Needed?: No  Information entered by :: Sally Crazier CMA   Activities of Daily Living     10/23/2023    1:13 PM 11/18/2022    9:22 AM  In your present state of health, do you have any difficulty performing the following activities:  Hearing? 0   Vision? 0   Difficulty concentrating or making decisions? 0   Walking or climbing stairs? 0   Dressing or bathing? 0   Doing errands, shopping? 0 0  Preparing Food and eating ? N   Using the Toilet? N   In the past six months, have you accidently leaked urine? N   Do you have problems with loss of bowel control? N   Managing your Medications? N   Managing your Finances? N   Housekeeping or managing your Housekeeping? N     Patient Care Team: Tobi Fortes, MD as PCP - General (Internal Medicine) Paulett Boros, MD as Medical Oncologist (Medical Oncology)  I have updated  your Care Teams any recent Medical Services you may have received from other providers in the past year.      Assessment:    This is a routine wellness examination for [facility].  Hearing/Vision screen Hearing Screening - Comments:: Patient denies any hearing difficulties.   Vision Screening - Comments:: .a   Goals Addressed             This Visit's Progress    Patient Stated       Stay active and healthy        Depression Screen     10/23/2023    1:00 PM 07/10/2023    1:01 PM 01/04/2023    1:14 PM 10/20/2022    2:33 PM 10/03/2022    1:26 PM  PHQ 2/9 Scores  PHQ - 2 Score 0 0 0 1 0  PHQ- 9 Score 0 0 0 1 0    Fall Risk     10/23/2023    1:14 PM 07/10/2023    1:01 PM 01/04/2023    1:14 PM 10/20/2022    2:33 PM 10/16/2022    9:34 PM  Fall Risk   Falls in the past year? 0 0 1 1 0  Number falls in past yr: 0 0 0 0 0  Injury with Fall? 0 0 0 0 0  Risk for fall due to : No Fall Risks No Fall Risks No Fall Risks No Fall Risks   Follow up Falls evaluation completed Falls evaluation completed Falls evaluation completed Falls evaluation completed     MEDICARE RISK AT HOME:  Medicare Risk at Home Any stairs in or around the home?: Yes If so, are there any without handrails?: No Home free of loose throw rugs in walkways, pet beds, electrical cords, etc?: Yes Adequate lighting in your home to reduce risk of falls?: Yes Life alert?: No Use of a cane, walker or w/c?: No Grab bars in the bathroom?: No Shower chair or bench in shower?: No Elevated toilet seat or a handicapped toilet?: No  TIMED UP AND GO:  Was the test performed?  Yes  Length of time to ambulate 10 feet: 5 sec Gait steady and fast without use of assistive device  Cognitive Function: 6CIT completed    10/20/2022    2:34 PM  MMSE - Mini Mental State Exam  Not completed: Unable to complete        10/23/2023    1:18 PM 10/20/2022    2:35 PM  6CIT Screen  What Year? 0 points 0 points  What month? 0 points 0 points  What time? 0 points 0 points  Count back from 20 0 points 0 points  Months in reverse 0 points 0  points  Repeat phrase 0 points 0 points  Total Score 0 points 0 points    Immunizations Immunization History  Administered Date(s) Administered   Fluad Quad(high Dose 65+) 02/20/2022, 02/28/2023   Fluzone Influenza virus vaccine,trivalent (IIV3), split virus 03/03/2016   Influenza Split 03/20/2009, 02/10/2017, 02/21/2019   Influenza Whole 06/25/2010, 03/29/2011, 02/21/2012   Influenza, High Dose Seasonal PF 01/29/2022   Influenza, Seasonal, Injecte, Preservative Fre 06/25/2010, 03/29/2011, 02/21/2012, 03/15/2012, 01/28/2013, 03/19/2015, 02/06/2019   Influenza,inj,Quad PF,6+ Mos 01/28/2013, 02/14/2017, 02/20/2018, 03/03/2020, 02/18/2021   Influenza-Unspecified 02/21/2007, 02/21/2007   Moderna Sars-Covid-2 Vaccination 07/23/2019, 08/20/2019, 04/15/2020, 10/14/2020   Pneumococcal Conjugate PCV 7 01/30/2008   Pneumococcal Conjugate,unspecified 01/30/2008   Pneumococcal Conjugate-13 05/29/2013, 08/12/2015   Pneumococcal Polysaccharide-23 01/30/2008, 03/18/2015, 06/01/2020   Respiratory Syncytial Virus  Vaccine,Recomb Aduvanted(Arexvy) 01/29/2022   Tdap 02/21/2012, 02/27/2016   Zoster Recombinant(Shingrix) 08/18/2020    Screening Tests Health Maintenance  Topic Date Due   Zoster Vaccines- Shingrix (2 of 2) 10/13/2020   COVID-19 Vaccine (5 - 2024-25 season) 01/22/2023   INFLUENZA VACCINE  12/22/2023   DEXA SCAN  03/11/2024   Medicare Annual Wellness (AWV)  10/22/2024   MAMMOGRAM  03/12/2025   DTaP/Tdap/Td (3 - Td or Tdap) 02/26/2026   Colonoscopy  11/03/2030   Pneumonia Vaccine 61+ Years old  Completed   Hepatitis C Screening  Completed   HPV VACCINES  Aged Out   Meningococcal B Vaccine  Aged Out   Lung Cancer Screening  Discontinued    Health Maintenance  Health Maintenance Due  Topic Date Due   Zoster Vaccines- Shingrix (2 of 2) 10/13/2020   COVID-19 Vaccine (5 - 2024-25 season) 01/22/2023   Health Maintenance Items Addressed: DEXA ordered  Additional  Screening:  Vision Screening: Recommended annual ophthalmology exams for early detection of glaucoma and other disorders of the eye. Would you like a referral to an eye doctor? No    Dental Screening: Recommended annual dental exams for proper oral hygiene  Community Resource Referral / Chronic Care Management: CRR required this visit?  No   CCM required this visit?  No   Plan:    I have personally reviewed and noted the following in the patient's chart:   Medical and social history Use of alcohol, tobacco or illicit drugs  Current medications and supplements including opioid prescriptions. Patient is not currently taking opioid prescriptions. Functional ability and status Nutritional status Physical activity Advanced directives List of other physicians Hospitalizations, surgeries, and ER visits in previous 12 months Vitals Screenings to include cognitive, depression, and falls Referrals and appointments  In addition, I have reviewed and discussed with patient certain preventive protocols, quality metrics, and best practice recommendations. A written personalized care plan for preventive services as well as general preventive health recommendations were provided to patient.   Dorthey Depace, CMA   10/23/2023   After Visit Summary: (MyChart) Due to this being a telephonic visit, the after visit summary with patients personalized plan was offered to patient via MyChart   Notes: Nothing significant to report at this time.a

## 2023-11-08 ENCOUNTER — Other Ambulatory Visit: Payer: Self-pay | Admitting: Internal Medicine

## 2023-11-08 ENCOUNTER — Ambulatory Visit: Payer: Medicare Other

## 2023-11-23 ENCOUNTER — Other Ambulatory Visit: Payer: Self-pay | Admitting: Internal Medicine

## 2023-11-25 ENCOUNTER — Other Ambulatory Visit: Payer: Self-pay | Admitting: Internal Medicine

## 2023-12-06 ENCOUNTER — Other Ambulatory Visit: Payer: Self-pay | Admitting: Internal Medicine

## 2023-12-06 DIAGNOSIS — J449 Chronic obstructive pulmonary disease, unspecified: Secondary | ICD-10-CM

## 2023-12-06 NOTE — Telephone Encounter (Unsigned)
 Copied from CRM [id number]. Topic: Clinical - Medication Refill >> Dec 06, 2023 11:49 AM Rilla B wrote: Medication:  arformoterol  (BROVANA ) 15 MCG/2ML NEBU  Has the patient contacted their pharmacy? Yes (Agent: If no, request that the patient contact the pharmacy for the refill. If patient does not wish to contact the pharmacy document the reason why and proceed with request.) (Agent: If yes, when and what did the pharmacy advise?)  This is the patient's preferred pharmacy:   Patient states the script should be faxed to:  [facility]  FAX: [phone]  Is this the correct pharmacy for this prescription? Yes If no, delete pharmacy and type the correct one.   Has the prescription been filled recently? Yes  Is the patient out of the medication? No  Has the patient been seen for an appointment in the last year OR does the patient have an upcoming appointment? Yes  Can we respond through MyChart? Yes  Agent: Please be advised that Rx refills may take up to 3 business days. We ask that you follow-up with your pharmacy.

## 2023-12-06 NOTE — Telephone Encounter (Signed)
 Patient states the script should be faxed to:  [facility]  FAX: [phone]     Copied from CRM [id number]. Topic: Clinical - Medication Refill >> Dec 06, 2023 11:49 AM Rilla B wrote: Medication:  arformoterol  (BROVANA ) 15 MCG/2ML NEBU  Has the patient contacted their pharmacy? Yes (Agent: If no, request that the patient contact the pharmacy for the refill. If patient does not wish to contact the pharmacy document the reason why and proceed with request.) (Agent: If yes, when and what did the pharmacy advise?)  This is the patient's preferred pharmacy:   Patient states the script should be faxed to:  [facility]  FAX: [phone]  Is this the correct pharmacy for this prescription? Yes If no, delete pharmacy and type the correct one.   Has the prescription been filled recently? Yes  Is the patient out of the medication? No  Has the patient been seen for an appointment in the last year OR does the patient have an upcoming appointment? Yes  Can we respond through MyChart? Yes  Agent: Please be advised that Rx refills may take up to 3 business days. We ask that you follow-up with your pharmacy.

## 2023-12-07 MED ORDER — ARFORMOTEROL TARTRATE 15 MCG/2ML IN NEBU: 15.0000 ug | INHALATION_SOLUTION | Freq: Two times a day (BID) | RESPIRATORY_TRACT | 4 refills | Status: DC

## 2023-12-11 ENCOUNTER — Other Ambulatory Visit: Payer: Self-pay

## 2023-12-11 ENCOUNTER — Other Ambulatory Visit: Payer: Self-pay | Admitting: Internal Medicine

## 2023-12-11 DIAGNOSIS — J449 Chronic obstructive pulmonary disease, unspecified: Secondary | ICD-10-CM

## 2023-12-11 MED ORDER — ARFORMOTEROL TARTRATE 15 MCG/2ML IN NEBU: 15.0000 ug | INHALATION_SOLUTION | Freq: Two times a day (BID) | RESPIRATORY_TRACT | 4 refills | Status: DC

## 2023-12-12 ENCOUNTER — Other Ambulatory Visit: Payer: Self-pay

## 2023-12-12 DIAGNOSIS — J449 Chronic obstructive pulmonary disease, unspecified: Secondary | ICD-10-CM

## 2023-12-12 MED ORDER — ARFORMOTEROL TARTRATE 15 MCG/2ML IN NEBU: 15.0000 ug | INHALATION_SOLUTION | Freq: Two times a day (BID) | RESPIRATORY_TRACT | 4 refills | Status: AC

## 2023-12-12 NOTE — Telephone Encounter (Signed)
 Copied from CRM [id number]. Topic: Clinical - Prescription Issue >> Dec 11, 2023  4:22 PM Joesph PARAS wrote: Reason for CRM: Patient is calling to state that her BROVANA  medication be re-faxed to [facility] (Fax: [phone]), as requested twice in original medication request.   Patient is now out of this medication. [facility].  Spoke with patient regarding prior message patient was upset because her medication has been sent to [facility] and patient want's it sent to [facility] per above.

## 2023-12-12 NOTE — Telephone Encounter (Deleted)
 Copied from CRM [id number]. Topic: Clinical - Prescription Issue >> Dec 11, 2023  4:22 PM Joesph PARAS wrote: Reason for CRM: Patient is calling to state that her BROVANA  medication be re-faxed to [facility] (Fax: [phone]), as requested twice in original medication request.   Patient is now out of this medication. [facility] to [facility] prescription [facility].

## 2023-12-26 ENCOUNTER — Telehealth: Payer: Self-pay

## 2023-12-26 NOTE — Telephone Encounter (Signed)
 Copied from CRM #[id number]. Topic: General - Other >> Dec 26, 2023  1:55 PM Sophia H wrote: Reason for CRM: Patient is calling in to speak with Abby, wanting to know about who she should establish care with moving forward.... Advised she would just pick a new provider that is accepting new patients but the patient states that she would just prefer to speak with Abby about this. Please reach out to patient, Ty. # [phone]

## 2023-12-28 NOTE — Telephone Encounter (Signed)
 Called patient to discuss. No answer. Left vm

## 2023-12-29 ENCOUNTER — Telehealth: Payer: Self-pay

## 2023-12-29 NOTE — Telephone Encounter (Signed)
 Patient called, asked for Abby to return call [phone].

## 2023-12-29 NOTE — Telephone Encounter (Signed)
 Called patient. No answer. Left vm and sent mychart message.

## 2023-12-29 NOTE — Telephone Encounter (Signed)
 Returned call. No answer. Left vm as well as sent a mychart message.

## 2024-01-08 NOTE — Telephone Encounter (Signed)
 Patient calling wanting return call from [facility]

## 2024-01-09 ENCOUNTER — Encounter: Payer: Medicare Other | Admitting: Internal Medicine

## 2024-01-09 ENCOUNTER — Other Ambulatory Visit: Payer: Self-pay | Admitting: Internal Medicine

## 2024-01-09 NOTE — Telephone Encounter (Signed)
 Spoke with patient. Provided her with information that she requested.

## 2024-01-09 NOTE — Telephone Encounter (Signed)
 Returned patients call. No answer. Left vm. Will attempt to speak with patient again tomorrow.

## 2024-01-10 ENCOUNTER — Telehealth: Payer: Self-pay

## 2024-01-10 NOTE — Telephone Encounter (Signed)
 Copied from CRM [id number]. Topic: Appointments - Appointment Scheduling >> Jan 09, 2024  5:01 PM Christy Bennett wrote: Patient/patient representative is calling to schedule an appointment. Refer to attachments for appointment information.   Pt called back to reschedule appt that she had today with dr Melvenia since he is gone. I was going to schedule her with Dr Johnita but she is booked out until December. Pt is needing refills on her medications and December is to far away for them to be refilled. Could you please call pt and either try to work her in sooner or refill her medicine until she can get in. Please advise pt.   ----------------------------------------------------------------------- From previous Reason for Contact - Scheduling: Patient/patient representative is calling to schedule an appointment. Refer to attachments for appointment information.

## 2024-01-10 NOTE — Telephone Encounter (Signed)
 This should not have been routed to admin- patient is already scheduled and on waitlist she is needing medication refills until her appointment. Thanks

## 2024-01-11 ENCOUNTER — Other Ambulatory Visit: Payer: Self-pay | Admitting: Internal Medicine

## 2024-01-11 DIAGNOSIS — F5104 Psychophysiologic insomnia: Secondary | ICD-10-CM

## 2024-01-15 ENCOUNTER — Other Ambulatory Visit: Payer: Self-pay

## 2024-01-15 DIAGNOSIS — E785 Hyperlipidemia, unspecified: Secondary | ICD-10-CM

## 2024-01-15 DIAGNOSIS — I10 Essential (primary) hypertension: Secondary | ICD-10-CM

## 2024-01-15 NOTE — Telephone Encounter (Signed)
 Pending medication needing refills they are pending in her chart

## 2024-01-16 ENCOUNTER — Other Ambulatory Visit: Payer: Self-pay

## 2024-01-16 DIAGNOSIS — E785 Hyperlipidemia, unspecified: Secondary | ICD-10-CM

## 2024-01-16 DIAGNOSIS — I10 Essential (primary) hypertension: Secondary | ICD-10-CM

## 2024-01-16 MED ORDER — SIMVASTATIN 10 MG PO TABS: 10.0000 mg | ORAL_TABLET | Freq: Every day | ORAL | 1 refills | Status: DC

## 2024-01-16 MED ORDER — LOSARTAN POTASSIUM 25 MG PO TABS: 25.0000 mg | ORAL_TABLET | Freq: Every day | ORAL | 1 refills | Status: DC

## 2024-01-16 MED ORDER — VERAPAMIL HCL ER 240 MG PO CP24: 240.0000 mg | ORAL_CAPSULE | Freq: Every day | ORAL | 1 refills | Status: DC

## 2024-01-25 ENCOUNTER — Ambulatory Visit

## 2024-02-04 ENCOUNTER — Other Ambulatory Visit: Payer: Self-pay | Admitting: Internal Medicine

## 2024-02-05 ENCOUNTER — Ambulatory Visit: Payer: Self-pay

## 2024-02-05 VITALS — BP 165/66 | HR 67 | Ht 62.0 in | Wt 120.0 lb

## 2024-02-05 DIAGNOSIS — G4700 Insomnia, unspecified: Secondary | ICD-10-CM

## 2024-02-05 DIAGNOSIS — I10 Essential (primary) hypertension: Secondary | ICD-10-CM | POA: Diagnosis not present

## 2024-02-05 DIAGNOSIS — E785 Hyperlipidemia, unspecified: Secondary | ICD-10-CM | POA: Diagnosis not present

## 2024-02-05 DIAGNOSIS — E039 Hypothyroidism, unspecified: Secondary | ICD-10-CM | POA: Diagnosis not present

## 2024-02-05 DIAGNOSIS — E559 Vitamin D deficiency, unspecified: Secondary | ICD-10-CM | POA: Diagnosis not present

## 2024-02-05 NOTE — Progress Notes (Unsigned)
 Established Patient Office Visit  Subjective   Patient ID: Christy Bennett, female    DOB: [birth year]  Age: 68 y.o. MRN: [id number]  Chief Complaint  Patient presents with   Transitions Of Care    Transfer of care from Dr Melvenia to Leita   HPI Discussed the use of AI scribe software for clinical note transcription with the patient, who gave verbal consent to proceed.  History of Present Illness   Christy Bennett is a 68 year old female with hypertension and hyperlipidemia who presents for a follow-up visit. She is accompanied by her husband, [name].  Hypertension - Concern about elevated blood pressure, especially after rushing to the appointment, which she feels may affect readings - Experiences auditory sensations of heart and blood rushing, attributed to high blood pressure - Does not regularly monitor blood pressure at home but has a manual monitor available - Takes verapamil  at night and losartan  in the morning for blood pressure control - No recent missed doses; refills recently obtained - Cautious about taking both antihypertensive medications in the morning to avoid hypotension  Weight management and appetite - Difficulty maintaining weight - Discrepancy between home scale and office scale - Appetite described as variable ('great, not great') - Active most of the time - Focus is on maintaining weight rather than healthy eating  Bowel habits - Infrequent bowel movements, attributed to hypothyroidism - Occasionally uses Metamucil for relief - No changes in bowel movements aside from infrequency  Infectious disease history and immunization plans - History of COVID-19 infection, described as worse than influenza - No history of influenza infection - Plans to receive influenza vaccination between October 1st and mid-October      Patient Active Problem List   Diagnosis Date Noted   History of malignant neoplasm of lung 03/13/2023   Anxiety 03/13/2023   Bunion  03/13/2023   Depression 03/13/2023   Hypokalemia 03/13/2023   Tobacco user 03/13/2023   Encounter for well adult exam with abnormal findings 01/04/2023   GERD (gastroesophageal reflux disease) 10/03/2022   NSCLC of right lung (HCC) 10/03/2022   PAD (peripheral artery disease) (HCC) 10/03/2022   Hypothyroidism 10/03/2022   Essential hypertension 10/03/2022   Hyperlipidemia 10/03/2022   Rheumatoid arthritis (HCC) 10/03/2022   Osteoporosis 10/03/2022   Insomnia 10/03/2022   Generalized anxiety disorder 10/03/2022   Dysphagia 09/08/2022   COPD  GOLD ? 07/28/2022   Callus of foot 04/29/2021   Paresthesia 10/08/2019   Breast cancer, left (HCC) 04/01/2019   Generalized osteoarthritis 05/07/2018   Arthralgia 01/02/2018   Atherosclerosis of artery of extremity with rest pain (HCC) 02/10/2017   Claudication (HCC) 10/19/2016   Familial tremor 02/21/2012   Kidney stone 06/25/2010   Occlusion and stenosis of unspecified carotid artery 09/03/2008   Vitamin D  deficiency, unspecified 07/22/2008   Lung cancer (HCC) 12/23/2007    ROS    Objective:     BP (!) 165/66   Pulse 67   Ht 5' 2 (1.575 m)   Wt 120 lb (54.4 kg)   SpO2 91%   BMI 21.95 kg/m  BP Readings from Last 3 Encounters:  02/05/24 (!) 165/66  10/23/23 102/64  09/19/23 132/82   Wt Readings from Last 3 Encounters:  02/05/24 120 lb (54.4 kg)  10/23/23 118 lb (53.5 kg)  09/19/23 119 lb 4.3 oz (54.1 kg)     Physical Exam Vitals and nursing note reviewed.  Constitutional:      Appearance: Normal appearance.  HENT:  Head: Normocephalic.  Eyes:     Extraocular Movements: Extraocular movements intact.     Pupils: Pupils are equal, round, and reactive to light.  Cardiovascular:     Rate and Rhythm: Normal rate and regular rhythm.  Pulmonary:     Effort: Pulmonary effort is normal.     Breath sounds: Normal breath sounds.  Musculoskeletal:     Cervical back: Normal range of motion and neck supple.   Neurological:     Mental Status: She is alert and oriented to person, place, and time.  Psychiatric:        Mood and Affect: Mood normal.        Thought Content: Thought content normal.     Results for orders placed or performed in visit on 02/05/24  CMP14+EGFR  Result Value Ref Range   Glucose 78 70 - 99 mg/dL   BUN 12 8 - 27 mg/dL   Creatinine, Ser 9.42 0.57 - 1.00 mg/dL   eGFR 98 >40 fO/fpw/8.26   BUN/Creatinine Ratio 21 12 - 28   Sodium 142 134 - 144 mmol/L   Potassium 4.0 3.5 - 5.2 mmol/L   Chloride 105 96 - 106 mmol/L   CO2 24 20 - 29 mmol/L   Calcium 9.2 8.7 - 10.3 mg/dL   Total Protein 5.8 (L) 6.0 - 8.5 g/dL   Albumin 4.2 3.9 - 4.9 g/dL   Globulin, Total 1.6 1.5 - 4.5 g/dL   Bilirubin Total 0.8 0.0 - 1.2 mg/dL   Alkaline Phosphatase 93 51 - 125 IU/L   AST 21 0 - 40 IU/L   ALT 19 0 - 32 IU/L  Lipid Profile  Result Value Ref Range   Cholesterol, Total 165 100 - 199 mg/dL   Triglycerides 68 0 - 149 mg/dL   HDL 62 >60 mg/dL   VLDL Cholesterol Cal 13 5 - 40 mg/dL   LDL Chol Calc (NIH) 90 0 - 99 mg/dL   Chol/HDL Ratio 2.7 0.0 - 4.4 ratio  TSH + free T4  Result Value Ref Range   TSH 0.275 (L) 0.450 - 4.500 uIU/mL   Free T4 2.16 (H) 0.82 - 1.77 ng/dL  Vitamin D  (25 hydroxy)  Result Value Ref Range   Vit D, 25-Hydroxy 51.9 30.0 - 100.0 ng/mL      The 10-year ASCVD risk score (Arnett DK, et al., 2019) is: 16.7%    Assessment & Plan:   Problem List Items Addressed This Visit       Cardiovascular and Mediastinum   Essential hypertension - Primary   Blood pressure elevated at 160/70 mmHg, likely stress-related. Occasional elevated readings at home. Currently on verapamil  and losartan . Considered increasing losartan  but concerned about hypotension. - Monitor blood pressure at home regularly and log readings. - Ensure fresh batteries for blood pressure monitor. - Continue verapamil  and losartan .        Endocrine   Hypothyroidism   Occasional constipation  attributed to hypothyroidism. - Order thyroid function tests.      Relevant Orders   TSH + free T4 (Completed)     Other   Hyperlipidemia   Currently prescribed simvastatin  10 mg daily -Repeat lipid panel today. -Follow-up according to lab results      Relevant Orders   CMP14+EGFR (Completed)   Lipid Profile (Completed)   Insomnia   Currently prescribed trazodone  150 mg nightly, which she feels is effective.  No refills needed at this time.      Vitamin D  deficiency, unspecified  Recheck vitamin D  levels.  Recommend a daily vitamin D  supplement with K2.      Relevant Orders   Vitamin D  (25 hydroxy) (Completed)        No follow-ups on file.    Leita Longs, FNP

## 2024-02-06 LAB — TSH+FREE T4
Free T4: 2.16 ng/dL — ABNORMAL HIGH (ref 0.82–1.77)
TSH: 0.275 u[IU]/mL — ABNORMAL LOW (ref 0.450–4.500)

## 2024-02-06 LAB — LIPID PANEL
Chol/HDL Ratio: 2.7 ratio (ref 0.0–4.4)
Cholesterol, Total: 165 mg/dL (ref 100–199)
HDL: 62 mg/dL (ref >39)
LDL Chol Calc (NIH): 90 mg/dL (ref 0–99)
Triglycerides: 68 mg/dL (ref 0–149)
VLDL Cholesterol Cal: 13 mg/dL (ref 5–40)

## 2024-02-06 LAB — CMP14+EGFR
ALT: 19 IU/L (ref 0–32)
AST: 21 IU/L (ref 0–40)
Albumin: 4.2 g/dL (ref 3.9–4.9)
Alkaline Phosphatase: 93 IU/L (ref 51–125)
BUN/Creatinine Ratio: 21 (ref 12–28)
BUN: 12 mg/dL (ref 8–27)
Bilirubin Total: 0.8 mg/dL (ref 0.0–1.2)
CO2: 24 mmol/L (ref 20–29)
Calcium: 9.2 mg/dL (ref 8.7–10.3)
Chloride: 105 mmol/L (ref 96–106)
Creatinine, Ser: 0.57 mg/dL (ref 0.57–1.00)
Globulin, Total: 1.6 g/dL (ref 1.5–4.5)
Glucose: 78 mg/dL (ref 70–99)
Potassium: 4 mmol/L (ref 3.5–5.2)
Sodium: 142 mmol/L (ref 134–144)
Total Protein: 5.8 g/dL — ABNORMAL LOW (ref 6.0–8.5)
eGFR: 98 mL/min/1.73 (ref >59)

## 2024-02-06 LAB — VITAMIN D 25 HYDROXY (VIT D DEFICIENCY, FRACTURES): Vit D, 25-Hydroxy: 51.9 ng/mL (ref 30.0–100.0)

## 2024-02-07 ENCOUNTER — Other Ambulatory Visit: Payer: Self-pay

## 2024-02-07 ENCOUNTER — Ambulatory Visit: Payer: Self-pay

## 2024-02-07 DIAGNOSIS — E039 Hypothyroidism, unspecified: Secondary | ICD-10-CM

## 2024-02-07 MED ORDER — LEVOTHYROXINE SODIUM 125 MCG PO TABS: 125.0000 ug | ORAL_TABLET | Freq: Every day | ORAL | 2 refills | Status: DC

## 2024-02-07 NOTE — Assessment & Plan Note (Signed)
 Blood pressure elevated at 160/70 mmHg, likely stress-related. Occasional elevated readings at home. Currently on verapamil  and losartan . Considered increasing losartan  but concerned about hypotension. - Monitor blood pressure at home regularly and log readings. - Ensure fresh batteries for blood pressure monitor. - Continue verapamil  and losartan .

## 2024-02-07 NOTE — Assessment & Plan Note (Signed)
 Occasional constipation attributed to hypothyroidism. - Order thyroid function tests.

## 2024-02-07 NOTE — Assessment & Plan Note (Signed)
 Recheck vitamin D  levels.  Recommend a daily vitamin D  supplement with K2.

## 2024-02-07 NOTE — Assessment & Plan Note (Signed)
 Currently prescribed simvastatin  10 mg daily -Repeat lipid panel today. -Follow-up according to lab results

## 2024-02-07 NOTE — Assessment & Plan Note (Signed)
 Currently prescribed trazodone  150 mg nightly, which she feels is effective.  No refills needed at this time.

## 2024-02-16 ENCOUNTER — Other Ambulatory Visit (INDEPENDENT_AMBULATORY_CARE_PROVIDER_SITE_OTHER): Payer: Self-pay | Admitting: Gastroenterology

## 2024-02-16 ENCOUNTER — Encounter (INDEPENDENT_AMBULATORY_CARE_PROVIDER_SITE_OTHER): Payer: Self-pay

## 2024-02-16 NOTE — Telephone Encounter (Signed)
 My chart message sent to pt. Medication is not on list, pt needed script printed out at last visit.

## 2024-02-19 ENCOUNTER — Other Ambulatory Visit: Payer: Self-pay

## 2024-02-19 ENCOUNTER — Ambulatory Visit: Payer: Self-pay

## 2024-02-19 DIAGNOSIS — I10 Essential (primary) hypertension: Secondary | ICD-10-CM

## 2024-02-19 MED ORDER — LOSARTAN POTASSIUM 50 MG PO TABS: 50.0000 mg | ORAL_TABLET | Freq: Every day | ORAL | 1 refills | Status: DC

## 2024-02-19 NOTE — Telephone Encounter (Signed)
 FYI Only or Action Required?: Action required by provider: update on patient condition. Dispo ED  Patient was last seen in primary care on 02/05/2024 by Bevely Doffing, FNP.  Called Nurse Triage reporting Hypertension.  Symptoms began a week ago.  Interventions attempted: Prescription medications: verapamil  and losartan .  Symptoms are: gradually worsening.  Triage Disposition: See HCP Within 4 Hours (Or PCP Triage)  Patient/caregiver understands and will follow disposition?:   Copied from CRM #[id number]. Topic: Clinical - Red Word Triage >> Feb 19, 2024  3:38 PM Roselie BROCKS wrote: Red Word that prompted transfer to Nurse Triage: Patient states her blood pressure is very high, its 220/100 Reason for Disposition  [1] Systolic BP >= 200 OR Diastolic >= 120 AND [2] having NO cardiac or neurologic symptoms  Answer Assessment - Initial Assessment Questions ED 9/23- with HTN, Vascular surgeon- blockage in R arm- arteriogram in a week. Pt has hx of fem-pop blockage Left arm BP- different then Right and advised to treat L arm  Motrin and tylenol  for join type pain  1. BLOOD PRESSURE: What is your blood pressure? Did you take at least two measurements 5 minutes apart?     220/90 @ 1530pm 200/100 @1315pm  2. ONSET: When did you take your blood pressure?     9/23 she went into ED- she was given meds for control and D/Cd home, has been SBP 180-220 for last 4 days 3. HOW: How did you take your blood pressure? (e.g., automatic home BP monitor, visiting nurse)     Taking own BP with a standard cuff 4. HISTORY: Do you have a history of high blood pressure?     Vascular hx-  5. MEDICINES: Are you taking any medicines for blood pressure? Have you missed any doses recently?     Verapamil  at bedtime , losartan  in the AM  6. OTHER SYMPTOMS: Do you have any symptoms? (e.g., blurred vision, chest pain, difficulty breathing, headache, weakness)     Has blockage in Right arm  Protocols used:  Blood Pressure - High-A-AH

## 2024-02-20 ENCOUNTER — Other Ambulatory Visit: Payer: Self-pay

## 2024-02-27 ENCOUNTER — Ambulatory Visit: Admitting: Family Medicine

## 2024-02-27 VITALS — BP 137/60 | HR 85 | Temp 98.3°F | Resp 16 | Ht 62.0 in | Wt 116.2 lb

## 2024-02-27 DIAGNOSIS — Z8639 Personal history of other endocrine, nutritional and metabolic disease: Secondary | ICD-10-CM

## 2024-02-27 DIAGNOSIS — I10 Essential (primary) hypertension: Secondary | ICD-10-CM | POA: Diagnosis not present

## 2024-02-27 DIAGNOSIS — E039 Hypothyroidism, unspecified: Secondary | ICD-10-CM

## 2024-02-27 DIAGNOSIS — M069 Rheumatoid arthritis, unspecified: Secondary | ICD-10-CM

## 2024-02-27 DIAGNOSIS — E785 Hyperlipidemia, unspecified: Secondary | ICD-10-CM | POA: Diagnosis not present

## 2024-02-27 DIAGNOSIS — G4700 Insomnia, unspecified: Secondary | ICD-10-CM

## 2024-02-27 DIAGNOSIS — Z7689 Persons encountering health services in other specified circumstances: Secondary | ICD-10-CM

## 2024-02-27 NOTE — Progress Notes (Unsigned)
 New Patient Office Visit  Subjective    Patient ID: Christy Bennett, female    DOB: 01-23-1956  Age: 68 y.o. MRN: [id number]  CC: No chief complaint on file.   HPI [name] presents to establish care and for review of chronic med issues including hypertension and hypothyroidism. Patient reports med compliance. Patient has some blockages in her right upper arm.    Outpatient Encounter Medications as of 02/27/2024  Medication Sig   albuterol  (PROAIR  HFA) 108 (90 Base) MCG/ACT inhaler Inhale 2 puffs into the lungs every 4 (four) hours as needed for shortness of breath or wheezing.   anastrozole  (ARIMIDEX ) 1 MG tablet Take 1 tablet (1 mg total) by mouth daily.   arformoterol  (BROVANA ) 15 MCG/2ML NEBU Take 2 mLs (15 mcg total) by nebulization in the morning and at bedtime.   ascorbic acid (VITAMIN C) 500 MG tablet Take 500 mg by mouth daily.   aspirin EC 81 MG tablet Take 81 mg by mouth daily.   b complex vitamins capsule Take 1 capsule by mouth daily.   buPROPion  (WELLBUTRIN  SR) 150 MG 12 hr tablet Take 1 tablet by mouth twice daily   Calcium Carb-Cholecalciferol (CALCIUM 600/VITAMIN D  PO) Take 1 tablet by mouth. 1200 one daily   cetirizine (ZYRTEC) 10 MG tablet Take 10 mg by mouth daily.   clopidogrel  (PLAVIX ) 75 MG tablet Take 1 tablet by mouth once daily   denosumab (PROLIA) 60 MG/ML SOSY injection Inject 60 mg into the skin every 6 (six) months.   diclofenac Sodium (VOLTAREN) 1 % GEL Apply 2 g topically 3 (three) times daily as needed (pain).   docusate sodium (COLACE) 50 MG capsule Take 50 mg by mouth as needed for mild constipation.   esomeprazole  (NEXIUM ) 20 MG capsule TAKE 1 CAPSULE BY MOUTH 2 TIMES A DAY   hydroxychloroquine (PLAQUENIL) 200 MG tablet Take 300 mg by mouth daily.   leflunomide (ARAVA) 20 MG tablet Take 20 mg by mouth daily.   levothyroxine  (SYNTHROID ) 125 MCG tablet Take 1 tablet (125 mcg total) by mouth daily.   losartan  (COZAAR ) 50 MG tablet Take 1 tablet  (50 mg total) by mouth daily.   meclizine (ANTIVERT) 25 MG tablet Take 25 mg by mouth 3 (three) times daily as needed for dizziness.   revefenacin (YUPELRI) 175 MCG/3ML nebulizer solution Take 175 mcg by nebulization daily.   simvastatin  (ZOCOR ) 10 MG tablet Take 1 tablet (10 mg total) by mouth daily.   sulfaSALAzine (AZULFIDINE) 500 MG tablet Take 500-1,000 mg by mouth See admin instructions. Taking 500 mg by mouth in the morning and 1000 mg in the evening   traZODone  (DESYREL ) 150 MG tablet TAKE 1 TABLET BY MOUTH AT BEDTIME   valACYclovir  (VALTREX ) 500 MG tablet Take 4 tablets (2,000 mg total) by mouth 2 (two) times daily as needed (fever blisters).   verapamil  (VERELAN ) 240 MG 24 hr capsule Take 1 capsule (240 mg total) by mouth daily.   Vitamin E 268 MG (400 UNIT) TABS Take 400 Units by mouth daily.   [DISCONTINUED] hydroxypropyl methylcellulose / hypromellose (ISOPTO TEARS / GONIOVISC) 2.5 % ophthalmic solution Place 1 drop into both eyes as needed for dry eyes.   [DISCONTINUED] ibuprofen (ADVIL) 600 MG tablet Take 600 mg by mouth every 6 (six) hours as needed for moderate pain.   No facility-administered encounter medications on file as of 02/27/2024.    Past Medical History:  Diagnosis Date   Allergy    Anxiety  Breast cancer (HCC) 04/2019   Clotting disorder    Im on a blood thinner and bruise easily   Complication of anesthesia    difficulty waking up after anesthesia   COPD (chronic obstructive pulmonary disease) (HCC)    Depression    Emphysema of lung (HCC)    Encounter for well adult exam with abnormal findings 01/04/2023   GERD (gastroesophageal reflux disease)    Hyperlipemia    Hypertension    Hypothyroid    Lung cancer (HCC) 2009   Neuromuscular disorder (HCC)    Osteoporosis    PVD (peripheral vascular disease)    RA (rheumatoid arthritis) (HCC)    Tremors of nervous system     Past Surgical History:  Procedure Laterality Date   BIOPSY  11/22/2022    Procedure: BIOPSY;  Surgeon: Christy Angelia Sieving, MD;  Location: [facility];  Service: Gastroenterology;;   BREAST LUMPECTOMY Left 04/23/2019   Invasive lobular carcinoma/Calcifications associated with carcinoma/ Margins uninvolved   BREAST LUMPECTOMY WITH RADIOACTIVE SEED AND SENTINEL LYMPH NODE BIOPSY Left 04/23/2019   Procedure: LEFT BREAST LUMPECTOMY WITH RADIOACTIVE SEED AND LEFT AXILLARY SENTINEL LYMPH NODE BIOPSY;  Surgeon: Christy Cough, MD;  Location: [facility];  Service: General;  Laterality: Left;   CATARACT EXTRACTION, BILATERAL     ESOPHAGEAL DILATION Left 11/22/2022   Procedure: ESOPHAGEAL DILATION;  Surgeon: Christy Angelia Sieving, MD;  Location: [facility];  Service: Gastroenterology;  Laterality: Left;  11:15am;asa 3   ESOPHAGOGASTRODUODENOSCOPY (EGD) WITH PROPOFOL  N/A 11/22/2022   Procedure: ESOPHAGOGASTRODUODENOSCOPY (EGD) WITH PROPOFOL ;  Surgeon: Christy Angelia Sieving, MD;  Location: [facility];  Service: Gastroenterology;  Laterality: N/A;  11:15am;asa 3   EYE SURGERY     Cataract surgery x2   FEMORAL BYPASS Right 2017   x3   KIDNEY STONE SURGERY      Family History  Problem Relation Age of Onset   Diabetes Mother    Hyperlipidemia Mother    Arthritis Mother    Heart disease Mother    Hypertension Mother    Kidney disease Mother    Lung cancer Maternal Aunt     Social History   Socioeconomic History   Marital status: Married    Spouse name: Christy Bennett   Number of children: Not on file   Years of education: Not on file   Highest education level: Professional school degree (e.g., MD, DDS, DVM, JD)  Occupational History   Occupation: retires  Tobacco Use   Smoking status: Former    Current packs/day: 0.00    Average packs/day: 1 pack/day for 43.0 years (43.0 ttl pk-yrs)    Types: Cigarettes    Start date: 04/11/1972    Quit date: 04/12/2015    Years since quitting: 8.8    Passive exposure: Past   Smokeless  tobacco: Never  Vaping Use   Vaping status: Never Used  Substance and Sexual Activity   Alcohol use: Not Currently    Comment: occasionally   Drug use: Never   Sexual activity: Yes    Birth control/protection: Post-menopausal  Other Topics Concern   Not on file  Social History Narrative   Lives with husband.   Social Drivers of Corporate investment banker Strain: Low Risk  (02/25/2024)   Overall Financial Resource Strain (CARDIA)    Difficulty of Paying Living Expenses: Not hard at all  Food Insecurity: No Food Insecurity (02/25/2024)   Hunger Vital Sign    Worried About Programme researcher, broadcasting/film/video in  the Last Year: Never true    Ran Out of Food in the Last Year: Never true  Transportation Needs: No Transportation Needs (02/25/2024)   PRAPARE - Administrator, Civil Service (Medical): No    Lack of Transportation (Non-Medical): No  Physical Activity: Insufficiently Active (02/25/2024)   Exercise Vital Sign    Days of Exercise per Week: 1 day    Minutes of Exercise per Session: 20 min  Stress: Stress Concern Present (02/25/2024)   [employer] of Occupational Health - Occupational Stress Questionnaire    Feeling of Stress: To some extent  Social Connections: Unknown (02/25/2024)   Social Connection and Isolation Panel    Frequency of Communication with Friends and Family: Once a week    Frequency of Social Gatherings with Friends and Family: Patient declined    Attends Religious Services: More than 4 times per year    Active Member of [employer] or Organizations: No    Attends Banker Meetings: Not on file    Marital Status: Married  Catering manager Violence: Not At Risk (10/23/2023)   Humiliation, Afraid, Rape, and Kick questionnaire    Fear of Current or Ex-Partner: No    Emotionally Abused: No    Physically Abused: No    Sexually Abused: No    Review of Systems  All other systems reviewed and are negative.       Objective   BP 137/60   Pulse 85    Temp 98.3 F (36.8 C) (Oral)   Resp 16   Ht 5' 2 (1.575 m)   Wt 116 lb 3.2 oz (52.7 kg)   SpO2 91%   BMI 21.25 kg/m   Physical Exam Vitals and nursing note reviewed.  Constitutional:      General: She is not in acute distress. Cardiovascular:     Rate and Rhythm: Normal rate and regular rhythm.  Pulmonary:     Effort: Pulmonary effort is normal.     Breath sounds: Normal breath sounds.  Abdominal:     Palpations: Abdomen is soft.     Tenderness: There is no abdominal tenderness.  Neurological:     General: No focal deficit present.     Mental Status: She is alert and oriented to person, place, and time.     {Labs (Optional):23779}    Assessment & Plan:   Essential hypertension  Hyperlipidemia, unspecified hyperlipidemia type  Hypothyroidism, unspecified type  Insomnia, unspecified type  Rheumatoid arthritis involving multiple sites, unspecified whether rheumatoid factor present (HCC)  History of vitamin D  deficiency  Encounter to establish care     No follow-ups on file.   Tanda Raguel SQUIBB, MD

## 2024-03-01 ENCOUNTER — Encounter: Payer: Self-pay | Admitting: Family Medicine

## 2024-03-04 ENCOUNTER — Other Ambulatory Visit: Payer: Self-pay

## 2024-03-04 ENCOUNTER — Telehealth: Payer: Self-pay

## 2024-03-04 DIAGNOSIS — J449 Chronic obstructive pulmonary disease, unspecified: Secondary | ICD-10-CM

## 2024-03-04 MED ORDER — YUPELRI 175 MCG/3ML IN SOLN: 175.0000 ug | Freq: Every day | RESPIRATORY_TRACT | 1 refills | Status: AC

## 2024-03-04 NOTE — Telephone Encounter (Signed)
 Copied from CRM [id number]. Topic: Clinical - Prescription Issue >> Mar 04, 2024  1:03 PM Whitney O wrote: Reason for RMF:ejupzwu is calling because she is  having trouble getting a refill for  arformoterol  .i get it from [facility] in july i tried to get a refill for a medication never could  get someone in office to fill the medication arformoterol . Patient had a lot of trouble last time and is wanting to speak with someone to get it sent to the correct pharmacy this time .   [facility] [address], [place], [place]  [id number] [phone]  Patient telephone number [phone] Dr darlean tinnie >> Mar 04, 2024  1:36 PM Corean SAUNDERS wrote: Patient made a mistake and needs a refill for Yupelri NOT arformoterol . Please fax Yupelri refill to [facility] at [phone]  Patient is very worried about this not being ordered correctly or being sent to the wrong pharmacy.   Sent neb soul to the correct pharm.

## 2024-03-06 ENCOUNTER — Other Ambulatory Visit: Payer: Self-pay

## 2024-03-06 ENCOUNTER — Encounter (INDEPENDENT_AMBULATORY_CARE_PROVIDER_SITE_OTHER): Payer: Self-pay | Admitting: Gastroenterology

## 2024-03-06 DIAGNOSIS — J449 Chronic obstructive pulmonary disease, unspecified: Secondary | ICD-10-CM

## 2024-03-06 NOTE — Telephone Encounter (Signed)
 Dme is requesting a nebulizer be ordered for pt - pt NOV is 03/12/2024 LOV 03/13/2023

## 2024-03-06 NOTE — Telephone Encounter (Signed)
 Order placed

## 2024-03-08 ENCOUNTER — Telehealth: Payer: Self-pay | Admitting: Internal Medicine

## 2024-03-08 ENCOUNTER — Other Ambulatory Visit: Payer: Self-pay

## 2024-03-08 DIAGNOSIS — J449 Chronic obstructive pulmonary disease, unspecified: Secondary | ICD-10-CM

## 2024-03-08 NOTE — Telephone Encounter (Signed)
 Patient called checking the status of the Nebulizer order requested to her DME earlier this week.  LVM for her to call for update --will reach out to the DME for update and relay to patient--and to remind her of the importance of her keeping her appointment with Dr. Darlean on 03/12/24

## 2024-03-12 ENCOUNTER — Ambulatory Visit: Admitting: Internal Medicine

## 2024-03-13 NOTE — Telephone Encounter (Signed)
 Spoke with patient regarding her nebulizer medication--she states she received it yesterday after calling [facility]---gave her my name and told her to ask for me the next time she needed assistance.  She voiced her understanding

## 2024-03-13 NOTE — Telephone Encounter (Unsigned)
 Copied from CRM #[id number]. Topic: Clinical - Prescription Issue >> Mar 08, 2024 12:01 PM Corean SAUNDERS wrote: Reason for CRM: Patient states she missed a call from [facility] regarding her nebulizer solution and is requesting a call back as E2C2 agent attempted to contact [facility] with no success.

## 2024-03-14 ENCOUNTER — Ambulatory Visit: Payer: Self-pay

## 2024-03-14 ENCOUNTER — Ambulatory Visit: Admitting: Internal Medicine

## 2024-03-14 NOTE — Telephone Encounter (Signed)
 FYI Only or Action Required?: FYI only for provider.  Patient was last seen in primary care on 02/27/2024 by Tanda Bleacher, MD.  Called Nurse Triage reporting Hypertension.  Symptoms began today, but has been high recently.  Interventions attempted: Prescription medications: has taken blood pressure medication.  Symptoms are: unchanged.  Triage Disposition: See Physician Within 24 Hours  Patient/caregiver understands and will follow disposition?: No, refuses disposition     Copied from CRM #[id number]. Topic: Clinical - Red Word Triage >> Mar 14, 2024 12:56 PM Tobias L wrote: Red Word that prompted transfer to Nurse Triage: some dizziness, higher bp than normal, 167/68 180/74      Reason for Disposition  Systolic BP >= 180 OR Diastolic >= 110  Answer Assessment - Initial Assessment Questions Patient reports she wants to be [facility] at [facility]. I advised there are no upcoming TOC appointments and offered an available appointment. Patient declined stating I'll figure something else out and ended the call.     1. BLOOD PRESSURE: What is your blood pressure? Did you take at least two measurements 5 minutes apart?     167/68 today and 180/74 yesterday  2. ONSET: When did you take your blood pressure?     Today  3. HOW: How did you take your blood pressure? (e.g., automatic home BP monitor, visiting nurse)     Automatic BP cuff  4. HISTORY: Do you have a history of high blood pressure?     Yes 5. MEDICINES: Are you taking any medicines for blood pressure? Have you missed any doses recently?     Has not missed a dose  6. OTHER SYMPTOMS: Do you have any symptoms? (e.g., blurred vision, chest pain, difficulty breathing, headache, weakness)      Mild headache  Protocols used: Blood Pressure - High-A-AH

## 2024-03-19 ENCOUNTER — Ambulatory Visit: Payer: Self-pay

## 2024-03-19 ENCOUNTER — Telehealth: Payer: Self-pay

## 2024-03-19 ENCOUNTER — Other Ambulatory Visit: Payer: Self-pay | Admitting: Family Medicine

## 2024-03-19 NOTE — Telephone Encounter (Signed)
 Patient called back and spoke with an other triage nurse, a new nurse triage encounter was open and completed. Signing to close this encounter. See nt encounter 03/19/24 11:31am.  Reason for Disposition . Caller has already spoken with another triager and has no further questions.  Protocols used: No Contact or Duplicate Contact Call-A-AH

## 2024-03-19 NOTE — Telephone Encounter (Signed)
     Message from Kevelyn M sent at 03/19/2024  9:53 AM EDT  Reason for Triage: Patient is having surgery 03/26/2024 and is concerned about her BP. BP:169/65 9:42 am 9:40 pm last night 180/72, day before 175/69 (morning), 172/69 bedtime. Please advise.  Call back: [phone]

## 2024-03-19 NOTE — Telephone Encounter (Signed)
 This RN attempted to contact patient, no answer, left vm.

## 2024-03-19 NOTE — Telephone Encounter (Signed)
 FYI Only or Action Required?: Action required by provider: update on patient condition.  Patient was last seen in primary care on 02/27/2024 by Christy Bleacher, MD.  Called Nurse Triage reporting Hypertension.  Symptoms began about a month ago.  Interventions attempted: Rest, hydration, or home remedies.  Symptoms are: unchanged.  Triage Disposition: See PCP When Office is Open (Within 3 Days)  Patient/caregiver understands and will follow disposition?: No, wishes to speak with PCP  Copied from CRM [id number]. Topic: Clinical - Christy Bennett Triage >> Mar 19, 2024  9:52 AM Christy Bennett wrote: Christy Bennett triggered transfer to Nurse Triage. See Triage Message for details. >> Mar 19, 2024 11:30 AM Christy Bennett wrote: Patient called back, stating she was disconnected fro speaking with someone from [facility] >> Mar 19, 2024 11:12 AM Christy Bennett wrote: Transferred per patient returning [facility] call >> Mar 19, 2024  9:53 AM Christy Bennett wrote: Reason for Triage: Patient is having surgery 03/26/2024 and is concerned about her BP. BP:169/65 9:42 am 9:40 pm last night 180/72, day before 175/69 (morning), 172/69 bedtime. Please advise.  Call back: [phone] Reason for Disposition  Systolic BP >= 160 OR Diastolic >= 100  Answer Assessment - Initial Assessment Questions 1. BLOOD PRESSURE: What is your blood pressure? Did you take at least two measurements 5 minutes apart?     Pt states she does not have her equipment with her, states her last BP was about 45 minutes ago and it was 170s/60s. Pt is not sure on the exact number 2. ONSET: When did you take your blood pressure?     Ongoing 3. HOW: How did you take your blood pressure? (e.g., automatic home BP monitor, visiting nurse)     Auto cuff 4. HISTORY: Do you have a history of high blood pressure?     Yes, states it is ongoing for about 1 month 5. MEDICINES: Are you taking any medicines for blood pressure? Have you missed any doses recently?     Takes 3  medications for BP control, takes as rx'd 6. OTHER SYMPTOMS: Do you have any symptoms? (e.g., blurred vision, chest pain, difficulty breathing, headache, weakness)     Denies headache, denies vision changes at this time  Pt states that she cannot schedule an appt at this time d/Bennett needing to have surgery next week. Pt was offered appt tomorrow with another provider at PCP clinic. Pt states that she has been taking her HTN medications. Pt was advised to reach out to the surgical team for next weeks surgery and let advise them of her recent BP readings.  Protocols used: Blood Pressure - High-A-AH

## 2024-03-19 NOTE — Telephone Encounter (Signed)
 Received Cmn, signed by Dr wert. Faxed and received fax confirmation.

## 2024-03-19 NOTE — Telephone Encounter (Signed)
 Prior to warm transfer, the call was dropped. Nurse will call patient back.

## 2024-03-19 NOTE — Telephone Encounter (Signed)
 Copied from CRM #[id number]. Topic: Clinical - Medication Refill >> Mar 19, 2024  9:53 AM Kevelyn M wrote: Medication: hydrALAZINE (APRESOLINE) 10 mg Tablet  Has the patient contacted their pharmacy? Yes (Agent: If no, request that the patient contact the pharmacy for the refill. If patient does not wish to contact the pharmacy document the reason why and proceed with request.) (Agent: If yes, when and what did the pharmacy advise?)  This is the patient's preferred pharmacy:  [id number] - [place], [place] - [address] [place] [address] Phone: [phone] Fax: [phone]   Is this the correct pharmacy for this prescription? Yes If no, delete pharmacy and type the correct one.   Has the prescription been filled recently? No  Is the patient out of the medication? No  Has the patient been seen for an appointment in the last year OR does the patient have an upcoming appointment? Yes  Can we respond through MyChart? Yes  Agent: Please be advised that Rx refills may take up to 3 business days. We ask that you follow-up with your pharmacy.

## 2024-03-20 ENCOUNTER — Ambulatory Visit (HOSPITAL_COMMUNITY)
Admission: RE | Admit: 2024-03-20 | Discharge: 2024-03-20 | Disposition: A | Discharge status: Home / Self Care | Source: Ambulatory Visit | Attending: Hematology | Admitting: Hematology

## 2024-03-20 ENCOUNTER — Encounter (HOSPITAL_COMMUNITY): Payer: Self-pay

## 2024-03-20 ENCOUNTER — Ambulatory Visit
Admission: RE | Admit: 2024-03-20 | Discharge: 2024-03-20 | Disposition: A | Payer: Self-pay | Source: Ambulatory Visit | Attending: Oncology | Admitting: Oncology

## 2024-03-20 ENCOUNTER — Inpatient Hospital Stay: Attending: Oncology

## 2024-03-20 ENCOUNTER — Other Ambulatory Visit (HOSPITAL_COMMUNITY): Payer: Self-pay | Admitting: Oncology

## 2024-03-20 ENCOUNTER — Ambulatory Visit
Admission: RE | Admit: 2024-03-20 | Discharge: 2024-03-20 | Disposition: A | Discharge status: Home / Self Care | Payer: Self-pay | Source: Ambulatory Visit | Attending: Oncology | Admitting: Oncology

## 2024-03-20 DIAGNOSIS — Z801 Family history of malignant neoplasm of trachea, bronchus and lung: Secondary | ICD-10-CM | POA: Diagnosis not present

## 2024-03-20 DIAGNOSIS — M810 Age-related osteoporosis without current pathological fracture: Secondary | ICD-10-CM | POA: Diagnosis not present

## 2024-03-20 DIAGNOSIS — Z85118 Personal history of other malignant neoplasm of bronchus and lung: Secondary | ICD-10-CM | POA: Insufficient documentation

## 2024-03-20 DIAGNOSIS — C50412 Malignant neoplasm of upper-outer quadrant of left female breast: Secondary | ICD-10-CM | POA: Diagnosis present

## 2024-03-20 DIAGNOSIS — Z1231 Encounter for screening mammogram for malignant neoplasm of breast: Secondary | ICD-10-CM | POA: Insufficient documentation

## 2024-03-20 DIAGNOSIS — Z170 Estrogen receptor positive status [ER+]: Secondary | ICD-10-CM | POA: Diagnosis not present

## 2024-03-20 DIAGNOSIS — Z79899 Other long term (current) drug therapy: Secondary | ICD-10-CM | POA: Diagnosis not present

## 2024-03-20 DIAGNOSIS — Z923 Personal history of irradiation: Secondary | ICD-10-CM | POA: Insufficient documentation

## 2024-03-20 DIAGNOSIS — Z79631 Long term (current) use of antimetabolite agent: Secondary | ICD-10-CM | POA: Diagnosis not present

## 2024-03-20 DIAGNOSIS — E559 Vitamin D deficiency, unspecified: Secondary | ICD-10-CM

## 2024-03-20 DIAGNOSIS — Z79811 Long term (current) use of aromatase inhibitors: Secondary | ICD-10-CM | POA: Insufficient documentation

## 2024-03-20 DIAGNOSIS — Z853 Personal history of malignant neoplasm of breast: Secondary | ICD-10-CM | POA: Diagnosis not present

## 2024-03-20 DIAGNOSIS — M069 Rheumatoid arthritis, unspecified: Secondary | ICD-10-CM | POA: Insufficient documentation

## 2024-03-20 DIAGNOSIS — D649 Anemia, unspecified: Secondary | ICD-10-CM | POA: Insufficient documentation

## 2024-03-20 DIAGNOSIS — C50912 Malignant neoplasm of unspecified site of left female breast: Secondary | ICD-10-CM | POA: Diagnosis present

## 2024-03-20 LAB — CBC WITH DIFFERENTIAL/PLATELET
Abs Immature Granulocytes: 0.02 K/uL (ref 0.00–0.07)
Basophils Absolute: 0.1 K/uL (ref 0.0–0.1)
Basophils Relative: 1 %
Eosinophils Absolute: 0.2 K/uL (ref 0.0–0.5)
Eosinophils Relative: 3 %
HCT: 33.1 % — ABNORMAL LOW (ref 36.0–46.0)
Hemoglobin: 10.7 g/dL — ABNORMAL LOW (ref 12.0–15.0)
Immature Granulocytes: 0 %
Lymphocytes Relative: 14 %
Lymphs Abs: 1 K/uL (ref 0.7–4.0)
MCH: 31.6 pg (ref 26.0–34.0)
MCHC: 32.3 g/dL (ref 30.0–36.0)
MCV: 97.6 fL (ref 80.0–100.0)
Monocytes Absolute: 0.8 K/uL (ref 0.1–1.0)
Monocytes Relative: 12 %
Neutro Abs: 4.9 K/uL (ref 1.7–7.7)
Neutrophils Relative %: 70 %
Platelets: 306 K/uL (ref 150–400)
RBC: 3.39 MIL/uL — ABNORMAL LOW (ref 3.87–5.11)
RDW: 14.2 % (ref 11.5–15.5)
WBC: 7.1 K/uL (ref 4.0–10.5)
nRBC: 0 % (ref 0.0–0.2)

## 2024-03-20 LAB — COMPREHENSIVE METABOLIC PANEL WITH GFR
ALT: 16 U/L (ref 0–44)
AST: 22 U/L (ref 15–41)
Albumin: 4.4 g/dL (ref 3.5–5.0)
Alkaline Phosphatase: 102 U/L (ref 38–126)
Anion gap: 10 (ref 5–15)
BUN: 20 mg/dL (ref 8–23)
CO2: 28 mmol/L (ref 22–32)
Calcium: 10.7 mg/dL — ABNORMAL HIGH (ref 8.9–10.3)
Chloride: 103 mmol/L (ref 98–111)
Creatinine, Ser: 0.6 mg/dL (ref 0.44–1.00)
GFR, Estimated: >60 mL/min (ref >60)
Glucose, Bld: 96 mg/dL (ref 70–99)
Potassium: 3.8 mmol/L (ref 3.5–5.1)
Sodium: 142 mmol/L (ref 135–145)
Total Bilirubin: 0.8 mg/dL (ref 0.0–1.2)
Total Protein: 6.6 g/dL (ref 6.5–8.1)

## 2024-03-20 NOTE — Telephone Encounter (Signed)
 Requested medication (s) are due for refill today: Yes  Requested medication (s) are on the active medication list: Yes  Last refill:  03/19/24  Future visit scheduled: Yes  Notes to clinic:  Unable to refill per protocol, last refill by another provider.      Requested Prescriptions  Pending Prescriptions Disp Refills   hydrALAZINE (APRESOLINE) 10 MG tablet      Sig: Take 1 tablet (10 mg total) by mouth.     Cardiovascular:  Vasodilators Failed - 03/20/2024  5:22 PM      Failed - HCT in normal range and within 360 days    HCT  Date Value Ref Range Status  03/20/2024 33.1 (L) 36.0 - 46.0 % Final   Hematocrit  Date Value Ref Range Status  01/04/2023 32.9 (L) 34.0 - 46.6 % Final         Failed - HGB in normal range and within 360 days    Hemoglobin  Date Value Ref Range Status  03/20/2024 10.7 (L) 12.0 - 15.0 g/dL Final  91/85/7975 89.2 (L) 11.1 - 15.9 g/dL Final         Failed - RBC in normal range and within 360 days    RBC  Date Value Ref Range Status  03/20/2024 3.39 (L) 3.87 - 5.11 MIL/uL Final         Failed - ANA Screen, Ifa, Serum in normal range and within 360 days    No results found for: ANA, ANATITER, LABANTI       Passed - WBC in normal range and within 360 days    WBC  Date Value Ref Range Status  03/20/2024 7.1 4.0 - 10.5 K/uL Final         Passed - PLT in normal range and within 360 days    Platelets  Date Value Ref Range Status  03/20/2024 306 150 - 400 K/uL Final  01/04/2023 273 150 - 450 x10E3/uL Final         Passed - Last BP in normal range    BP Readings from Last 1 Encounters:  02/27/24 137/60         Passed - Valid encounter within last 12 months    Recent Outpatient Visits           3 weeks ago Essential hypertension   [facility], [place]       Future Appointments             In 2 weeks Geofm Delon BRAVO, NP [facility] [place]            Signed Prescriptions Disp Refills   hydrALAZINE (APRESOLINE) 10 MG tablet      Sig: Take 10 mg by mouth.     There is no refill protocol information for this order

## 2024-03-21 LAB — CANCER ANTIGEN 15-3: CA 15-3: 22.9 U/mL (ref 0.0–25.0)

## 2024-03-22 NOTE — Telephone Encounter (Signed)
 Copied from CRM #[id number]. Topic: Clinical - Prescription Issue >> Mar 22, 2024 12:10 PM Winona R wrote: Pt calling to check on med refill requested on 10/28  for hydrALAZINE (APRESOLINE) 10 mg Tablet and states she will run out if its not filled today.. >> Mar 22, 2024  3:41 PM Montie POUR wrote: She is calling back and really needs hydrALAZINE (APRESOLINE) 10 mg Tablet refill today.  Merline called in refill on 03/19/24. Please call her if she cannot get the refill today at [phone]. Please send it to [facility] [place] in her chart. Thanks

## 2024-03-22 NOTE — Telephone Encounter (Signed)
 Copied from CRM #[id number]. Topic: Clinical - Prescription Issue >> Mar 22, 2024 12:10 PM Winona R wrote: Pt calling to check on med refill requested on 10/28  for hydrALAZINE (APRESOLINE) 10 mg Tablet and states she will run out if its not filled today.SABRA

## 2024-03-26 MED ORDER — HYDRALAZINE HCL 10 MG PO TABS: 10.0000 mg | ORAL_TABLET | Freq: Three times a day (TID) | ORAL | 1 refills | Status: DC

## 2024-04-02 ENCOUNTER — Ambulatory Visit: Admitting: Oncology

## 2024-04-03 ENCOUNTER — Inpatient Hospital Stay

## 2024-04-03 ENCOUNTER — Other Ambulatory Visit: Payer: Self-pay | Admitting: *Deleted

## 2024-04-03 ENCOUNTER — Inpatient Hospital Stay: Attending: Oncology | Admitting: Oncology

## 2024-04-03 ENCOUNTER — Ambulatory Visit: Payer: Self-pay | Admitting: Oncology

## 2024-04-03 VITALS — BP 138/58 | HR 84 | Temp 98.3°F | Resp 18 | Ht 62.0 in | Wt 112.2 lb

## 2024-04-03 DIAGNOSIS — D508 Other iron deficiency anemias: Secondary | ICD-10-CM

## 2024-04-03 DIAGNOSIS — M069 Rheumatoid arthritis, unspecified: Secondary | ICD-10-CM | POA: Insufficient documentation

## 2024-04-03 DIAGNOSIS — R519 Headache, unspecified: Secondary | ICD-10-CM | POA: Insufficient documentation

## 2024-04-03 DIAGNOSIS — Z801 Family history of malignant neoplasm of trachea, bronchus and lung: Secondary | ICD-10-CM | POA: Insufficient documentation

## 2024-04-03 DIAGNOSIS — Z79811 Long term (current) use of aromatase inhibitors: Secondary | ICD-10-CM | POA: Insufficient documentation

## 2024-04-03 DIAGNOSIS — Z923 Personal history of irradiation: Secondary | ICD-10-CM | POA: Insufficient documentation

## 2024-04-03 DIAGNOSIS — K5909 Other constipation: Secondary | ICD-10-CM | POA: Insufficient documentation

## 2024-04-03 DIAGNOSIS — R110 Nausea: Secondary | ICD-10-CM | POA: Insufficient documentation

## 2024-04-03 DIAGNOSIS — E559 Vitamin D deficiency, unspecified: Secondary | ICD-10-CM

## 2024-04-03 DIAGNOSIS — R5383 Other fatigue: Secondary | ICD-10-CM | POA: Insufficient documentation

## 2024-04-03 DIAGNOSIS — Z85118 Personal history of other malignant neoplasm of bronchus and lung: Secondary | ICD-10-CM | POA: Insufficient documentation

## 2024-04-03 DIAGNOSIS — M25511 Pain in right shoulder: Secondary | ICD-10-CM | POA: Insufficient documentation

## 2024-04-03 DIAGNOSIS — Z170 Estrogen receptor positive status [ER+]: Secondary | ICD-10-CM | POA: Insufficient documentation

## 2024-04-03 DIAGNOSIS — C50912 Malignant neoplasm of unspecified site of left female breast: Secondary | ICD-10-CM | POA: Insufficient documentation

## 2024-04-03 DIAGNOSIS — C50412 Malignant neoplasm of upper-outer quadrant of left female breast: Secondary | ICD-10-CM

## 2024-04-03 DIAGNOSIS — Z87891 Personal history of nicotine dependence: Secondary | ICD-10-CM | POA: Insufficient documentation

## 2024-04-03 DIAGNOSIS — Z79899 Other long term (current) drug therapy: Secondary | ICD-10-CM | POA: Insufficient documentation

## 2024-04-03 DIAGNOSIS — Z79631 Long term (current) use of antimetabolite agent: Secondary | ICD-10-CM | POA: Insufficient documentation

## 2024-04-03 DIAGNOSIS — M810 Age-related osteoporosis without current pathological fracture: Secondary | ICD-10-CM | POA: Insufficient documentation

## 2024-04-03 LAB — CBC WITH DIFFERENTIAL/PLATELET
Abs Immature Granulocytes: 0.01 K/uL (ref 0.00–0.07)
Basophils Absolute: 0.1 K/uL (ref 0.0–0.1)
Basophils Relative: 2 %
Eosinophils Absolute: 0.2 K/uL (ref 0.0–0.5)
Eosinophils Relative: 3 %
HCT: 33.7 % — ABNORMAL LOW (ref 36.0–46.0)
Hemoglobin: 10.7 g/dL — ABNORMAL LOW (ref 12.0–15.0)
Immature Granulocytes: 0 %
Lymphocytes Relative: 15 %
Lymphs Abs: 1 K/uL (ref 0.7–4.0)
MCH: 31.7 pg (ref 26.0–34.0)
MCHC: 31.8 g/dL (ref 30.0–36.0)
MCV: 99.7 fL (ref 80.0–100.0)
Monocytes Absolute: 0.8 K/uL (ref 0.1–1.0)
Monocytes Relative: 12 %
Neutro Abs: 4.4 K/uL (ref 1.7–7.7)
Neutrophils Relative %: 68 %
Platelets: 305 K/uL (ref 150–400)
RBC: 3.38 MIL/uL — ABNORMAL LOW (ref 3.87–5.11)
RDW: 14 % (ref 11.5–15.5)
WBC: 6.4 K/uL (ref 4.0–10.5)
nRBC: 0 % (ref 0.0–0.2)

## 2024-04-03 LAB — IRON AND TIBC
Iron: 55 ug/dL (ref 28–170)
Saturation Ratios: 18 % (ref 10.4–31.8)
TIBC: 311 ug/dL (ref 250–450)
UIBC: 256 ug/dL

## 2024-04-03 LAB — FERRITIN: Ferritin: 217 ng/mL (ref 11–307)

## 2024-04-03 LAB — VITAMIN B12: Vitamin B-12: 1399 pg/mL — ABNORMAL HIGH (ref 180–914)

## 2024-04-03 LAB — FOLATE: Folate: >20 ng/mL (ref >5.9)

## 2024-04-03 NOTE — Progress Notes (Signed)
 I saw her today and she had concerns about her hemoglobin dropping after her recent surgery.  Her hemoglobin seems to have rebounded and looks good.  10.7.  Iron saturations are a little bit on the low side but overall not horrible.  She could certainly start taking iron supplements every other day with vitamin C to see if that helped her symptoms.  No need for IV iron at this time.  Delon Hope, NP 04/03/2024 2:56 PM

## 2024-04-03 NOTE — Progress Notes (Signed)
 Patient notified and verbalized understanding.

## 2024-04-03 NOTE — Progress Notes (Signed)
 [facility] [address], [place] [address]    Clinic Day:  04/03/2024  Referring physician: Melvenia Manus BRAVO, Bennett  Patient Care Team: Christy Bleacher, Bennett as PCP - General (Family Medicine) Christy Bennett, OD (Optometry) Christy Lupita RAMAN, DO (Internal Medicine) Christy Bennett, DPM as Referring Physician (Podiatry) Rennie, Lynwood MATSU, Bennett as Referring Physician (Specialist)   ASSESSMENT & PLAN:   Assessment: 1.  Stage I (T1CN0) invasive lobular carcinoma of the left breast: -Biopsy on 02/28/2019 [facility] consistent with invasive lobular carcinoma 12:30 position, grade 1, ER positive, PR negative and HER-2 negative by FISH. -Lumpectomy and sentinel lymph node biopsy on 04/23/2019 by Christy Bennett shows grade 1, 1.5 cm invasive lobular carcinoma, margins negative, 1 sentinel lymph node negative, ER more than 90%, PR negative, HER-2 negative, Ki-67 not done.  PT1CPN0. -Anastrozole  started around 05/14/2019. -She was seen by radiation oncology in [place].  As the absolute reduction in local recurrence was low at 3% with radiation, patient opted to not to undergo XRT.   2.  Stage III (T3 N2 M0) non-small cell lung cancer, adenocarcinoma type: -She was treated with chemoradiation therapy with cisplatin VP-16 from 02/18/2008 through 04/25/2008. -CT chest on 02/25/2019 showed scarring in the right upper medial lung consistent with history of prior treatment.  3 cm left lower lobe lung cyst is unchanged.  No adenopathy. - CT chest (09/08/2020): Done at [facility] did not show any evidence of recurrence.  Bilateral patchy infiltrates suggestive of infectious process.   3.  Osteoporosis: -Started on Prolia in September 2020, receives in [place] .  This was prescribed by her rheumatologist in [place]. -She will continue calcium and vitamin D  supplements.    Plan: 1.  Stage I (T1CN0) invasive lobular carcinoma of the left breast: - She is  tolerating anastrozole  very well. - Mammogram on 03/20/24: BI-RADS Category 1. - Breast exam was unremarkable. - Labs on 03/20/24: Normal LFTs.  CBC shows mild anemia with hemoglobin 10.7.   -Patient had recent surgery and would like to repeat her nutritional labs. -She is currently taking iron 3 days/week. -Will call with results. - Recommend follow-up in 6 months.   -BCI testing shows no benefit to extended endocrine therapy.  Risk of late recurrence between 5 and 10 years is less than 2.5%. -She will stop anastrozole  on December 31. -Return to clinic in 6 months with labs a few days before.   2.  Stage III (T3 N2 M0) non-small cell lung cancer, adenocarcinoma type: - We reviewed CT chest without contrast from 08/22/2023: Stable exam with stable radiation fibrosis in the right apex and suprahilar right medial lung.  Multiple small bilateral pulmonary nodules have not changed from 03/13/2023. - Repeat CT chest without contrast in 1 year from previous.  Will get orders placed. -No current concerns for recurrence.   3.  Osteoporosis: - Continue Prolia and annual under the direction of Dr. Lenon of rheumatology.  Last vitamin D  is normal.  Vitamin D  level is 77.   4.  Rheumatoid arthritis: - Continue Arava, Plaquenil and sulfasalazine.  5.  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 -Tolerated procedure well. -Discharged home on DAPT with Plavix  and aspirin. -At discharge hemoglobin was 8.3. -She is interested in labs to make sure they have improved.  Will call with results. -Nutritional labs from today show normal folate, vitamin B12 1399, ferritin 217 with iron  saturation 18%.  TIBC normal at 311.  Hemoglobin improved to 10.7. -Recommend she continue iron supplements but can increase to every other day instead of 3 days/week.  Patient was in agreement.    Orders Placed This Encounter   Procedures   CT Chest Wo Contrast    Standing Status:   Future    Expected Date:   09/02/2024    Expiration Date:   04/03/2025    Preferred imaging location?:   [facility]   CBC with Differential    Standing Status:   Future    Expected Date:   10/03/2024    Expiration Date:   04/03/2025   Comprehensive metabolic panel    Standing Status:   Future    Expected Date:   10/03/2024    Expiration Date:   04/03/2025   Cancer antigen 15-3    Standing Status:   Future    Expected Date:   10/03/2024    Expiration Date:   04/03/2025     Christy FORBES Hope, NP   11/12/202510:15 AM  CHIEF COMPLAINT:   Diagnosis: left breast cancer    Cancer Staging  Breast cancer, left [facility]) Staging form: Breast, AJCC 8th Edition - Clinical stage from 05/14/2019: Stage IA (cT1c, cN0, cM0, G1, ER+, PR-, HER2-) - Signed by Christy Bennett on 05/14/2019    Prior Therapy: Lumpectomy and SLNB on 04/23/2019  Current Therapy:  anastrozole    HISTORY OF PRESENT ILLNESS:   Oncology History  Breast cancer, left (HCC)  04/01/2019 Initial Diagnosis   Breast cancer, left (HCC)   05/14/2019 Cancer Staging   Staging form: Breast, AJCC 8th Edition - Clinical stage from 05/14/2019: Stage IA (cT1c, cN0, cM0, G1, ER+, PR-, HER2-) - Signed by Christy Bennett on 05/14/2019      INTERVAL HISTORY:   Christy Bennett is a 68 y.o. female presenting to clinic today for follow up of left breast cancer. She was last seen by me on 03/21/23.  Since her last visit, she was admitted for right shoulder pain and found to have right axillary artery stenosis requiring right common carotid axillary artery bypass with PTFE which she had done on 03/26/2024.  Reports overall feeling well post op.  Thinks when she was discharged from [facility] clinic hemoglobin was less than 9.  She would like to have this rechecked today if possible.  She is feeling more tired than usual.  Overall, she is doing well post the surgery.   Denies any bleeding, reports appetite is low 25% and energy levels are also low.  Denies any pain.  She has chronic constipation and nausea.  Has cough and shortness of breath at times.  Has dizziness and headaches.  She is currently taking iron supplements 3 times per week.  Tolerating well.  She is continuing anastrozole  from 2020 and tolerating it well.  She gets Prolia injections with her rheumatologist.  PAST MEDICAL HISTORY:   Past Medical History: Past Medical History:  Diagnosis Date   Allergy    Anxiety    Breast cancer (HCC) 04/2019   Clotting disorder    Im on a blood thinner and bruise easily   Complication of anesthesia    difficulty waking up after anesthesia   COPD (chronic obstructive pulmonary disease) (HCC)    Depression    Emphysema of lung (HCC)    Encounter for well adult exam with abnormal findings 01/04/2023   GERD (gastroesophageal reflux disease)    Hyperlipemia    Hypertension  Hypothyroid    Lung cancer (HCC) 2009   Neuromuscular disorder (HCC)    Osteoporosis    PVD (peripheral vascular disease)    RA (rheumatoid arthritis) (HCC)    Tremors of nervous system     Surgical History: Past Surgical History:  Procedure Laterality Date   BIOPSY  11/22/2022   Procedure: BIOPSY;  Surgeon: Eartha Angelia Sieving, Bennett;  [facility];  Service: Gastroenterology;;   BREAST LUMPECTOMY Left 04/23/2019   Invasive lobular carcinoma/Calcifications associated with carcinoma/ Margins uninvolved   BREAST LUMPECTOMY WITH RADIOACTIVE SEED AND SENTINEL LYMPH NODE BIOPSY Left 04/23/2019   Procedure: LEFT BREAST LUMPECTOMY WITH RADIOACTIVE SEED AND LEFT AXILLARY SENTINEL LYMPH NODE BIOPSY;  Surgeon: Bennett Cough, Bennett;  Location: [facility];  Service: General;  Laterality: Left;   CATARACT EXTRACTION, BILATERAL     ESOPHAGEAL DILATION Left 11/22/2022   Procedure: ESOPHAGEAL DILATION;  Surgeon: Eartha Angelia Sieving, Bennett;  [facility]  [facility];  Service: Gastroenterology;  Laterality: Left;  11:15am;asa 3   ESOPHAGOGASTRODUODENOSCOPY (EGD) WITH PROPOFOL  N/A 11/22/2022   Procedure: ESOPHAGOGASTRODUODENOSCOPY (EGD) WITH PROPOFOL ;  Surgeon: Eartha Angelia Sieving, Bennett;  [facility];  Service: Gastroenterology;  Laterality: N/A;  11:15am;asa 3   EYE SURGERY     Cataract surgery x2   FEMORAL BYPASS Right 2017   x3   KIDNEY STONE SURGERY      Social History: Social History   Socioeconomic History   Marital status: Married    Spouse name: Reyes   Number of children: Not on file   Years of education: Not on file   Highest education level: Professional school degree (e.g., Bennett, DDS, DVM, JD)  Occupational History   Occupation: retires  Tobacco Use   Smoking status: Former    Current packs/day: 0.00    Average packs/day: 1 pack/day for 43.0 years (43.0 ttl pk-yrs)    Types: Cigarettes    Start date: 04/11/1972    Quit date: 04/12/2015    Years since quitting: 8.9    Passive exposure: Past   Smokeless tobacco: Never  Vaping Use   Vaping status: Never Used  Substance and Sexual Activity   Alcohol use: Not Currently    Comment: occasionally   Drug use: Never   Sexual activity: Yes    Birth control/protection: Post-menopausal  Other Topics Concern   Not on file  Social History Narrative   Lives with husband.   Social Drivers of Corporate Investment Banker Strain: Low Risk  (03/27/2024)   Received [facility]   Overall Financial Resource Strain (CARDIA)    How hard is it for you to pay for the very basics like food, housing, medical care, and heating?: Not hard at all  Food Insecurity: No Food Insecurity (03/27/2024)   Received from [facility]   Hunger Vital Sign    Within the past 12 months, you worried that your food would run out before you got the money to buy more.: Never true    Within the past 12 months, the food you bought just didn't last and you didn't have money to  get more.: Never true  Transportation Needs: No Transportation Needs (03/27/2024)   Received from [facility] - Transportation    In the past 12 months, has lack of transportation kept you from medical appointments or from getting medications?: No    In the past 12 months, has lack of transportation kept you from meetings, work, or from  getting things needed for daily living?: No  Physical Activity: Inactive (03/27/2024)   Received from [facility]   Exercise Vital Sign    On average, how many days per week do you engage in moderate to strenuous exercise (like a brisk walk)?: 0 days    On average, how many minutes do you engage in exercise at this level?: 0 min  Stress: Stress Concern Present (03/27/2024)   Received from [facility] - Occupational Stress Questionnaire    Do you feel stress - tense, restless, nervous, or anxious, or unable to sleep at night because your mind is troubled all the time - these days?: Rather much  Social Connections: Moderately Integrated (03/27/2024)   Received from [facility]   Social Connection and Isolation Panel    In a typical week, how many times do you talk on the phone with family, friends, or neighbors?: More than three times a week    How often do you get together with friends or relatives?: More than three times a week    How often do you attend church or religious services?: More than 4 times per year    Do you belong to any clubs or organizations such as church groups, unions, fraternal or athletic groups, or school groups?: No    How often do you attend meetings of the clubs or organizations you belong to?: Never    Are you married, widowed, divorced, separated, never married, or living with a partner?: Married  Intimate Partner Violence: Not At Risk (03/27/2024)   Received from [facility]   Humiliation, Afraid, Rape, and Kick questionnaire    Within the last year, have you  been afraid of your partner or ex-partner?: No    Within the last year, have you been humiliated or emotionally abused in other ways by your partner or ex-partner?: No    Within the last year, have you been kicked, hit, slapped, or otherwise physically hurt by your partner or ex-partner?: No    Within the last year, have you been raped or forced to have any kind of sexual activity by your partner or ex-partner?: No    Family History: Family History  Problem Relation Age of Onset   Diabetes Mother    Hyperlipidemia Mother    Arthritis Mother    Heart disease Mother    Hypertension Mother    Kidney disease Mother    Lung cancer Maternal Aunt     Current Medications:  Current Outpatient Medications:    albuterol  (PROAIR  HFA) 108 (90 Base) MCG/ACT inhaler, Inhale 2 puffs into the lungs every 4 (four) hours as needed for shortness of breath or wheezing., Disp: 18 g, Rfl: 1   anastrozole  (ARIMIDEX ) 1 MG tablet, Take 1 tablet (1 mg total) by mouth daily., Disp: 90 tablet, Rfl: 3   arformoterol  (BROVANA ) 15 MCG/2ML NEBU, Take 2 mLs (15 mcg total) by nebulization in the morning and at bedtime., Disp: 60 mL, Rfl: 4   ascorbic acid (VITAMIN C) 500 MG tablet, Take 500 mg by mouth daily., Disp: , Rfl:    aspirin EC 81 MG tablet, Take 81 mg by mouth daily., Disp: , Rfl:    b complex vitamins capsule, Take 1 capsule by mouth daily., Disp: , Rfl:    buPROPion  (WELLBUTRIN  SR) 150 MG 12 hr tablet, Take 1 tablet by mouth twice daily, Disp: 180 tablet, Rfl: 0   Calcium Carb-Cholecalciferol (CALCIUM 600/VITAMIN D  PO), Take 1  tablet by mouth. 1200 one daily, Disp: , Rfl:    cetirizine (ZYRTEC) 10 MG tablet, Take 10 mg by mouth daily., Disp: , Rfl:    clopidogrel  (PLAVIX ) 75 MG tablet, Take 1 tablet by mouth once daily, Disp: 90 tablet, Rfl: 0   denosumab (PROLIA) 60 MG/ML SOSY injection, Inject 60 mg into the skin every 6 (six) months., Disp: , Rfl:    diclofenac Sodium (VOLTAREN) 1 % GEL, Apply 2 g  topically 3 (three) times daily as needed (pain)., Disp: , Rfl:    docusate sodium (COLACE) 50 MG capsule, Take 50 mg by mouth as needed for mild constipation., Disp: , Rfl:    esomeprazole  (NEXIUM ) 20 MG capsule, TAKE 1 CAPSULE BY MOUTH 2 TIMES A DAY, Disp: 180 capsule, Rfl: 0   ferrous sulfate 325 (65 FE) MG tablet, Take 325 mg by mouth., Disp: , Rfl:    hydrALAZINE (APRESOLINE) 10 MG tablet, Take 1 tablet (10 mg total) by mouth 3 (three) times daily., Disp: 90 tablet, Rfl: 1   hydroxychloroquine (PLAQUENIL) 200 MG tablet, Take 300 mg by mouth daily., Disp: , Rfl:    leflunomide (ARAVA) 20 MG tablet, Take 20 mg by mouth daily., Disp: , Rfl:    levothyroxine  (SYNTHROID ) 125 MCG tablet, Take 1 tablet (125 mcg total) by mouth daily., Disp: 90 tablet, Rfl: 2   losartan  (COZAAR ) 50 MG tablet, Take 1 tablet (50 mg total) by mouth daily., Disp: 30 tablet, Rfl: 1   meclizine (ANTIVERT) 25 MG tablet, Take 25 mg by mouth 3 (three) times daily as needed for dizziness., Disp: , Rfl:    revefenacin (YUPELRI) 175 MCG/3ML nebulizer solution, Take 3 mLs (175 mcg total) by nebulization daily., Disp: 90 mL, Rfl: 1   simvastatin  (ZOCOR ) 10 MG tablet, Take 1 tablet (10 mg total) by mouth daily., Disp: 90 tablet, Rfl: 1   sulfaSALAzine (AZULFIDINE) 500 MG tablet, Take 500-1,000 mg by mouth See admin instructions. Taking 500 mg by mouth in the morning and 1000 mg in the evening, Disp: , Rfl:    traZODone  (DESYREL ) 150 MG tablet, TAKE 1 TABLET BY MOUTH AT BEDTIME, Disp: 90 tablet, Rfl: 0   valACYclovir  (VALTREX ) 500 MG tablet, Take 4 tablets (2,000 mg total) by mouth 2 (two) times daily as needed (fever blisters)., Disp: 20 tablet, Rfl: 1   verapamil  (VERELAN ) 240 MG 24 hr capsule, Take 1 capsule (240 mg total) by mouth daily., Disp: 90 capsule, Rfl: 1   Vitamin E 268 MG (400 UNIT) TABS, Take 400 Units by mouth daily., Disp: , Rfl:    HYDROcodone-acetaminophen  (NORCO/VICODIN) 5-325 MG tablet, Take 1 tablet by mouth.  (Patient not taking: Reported on 04/03/2024), Disp: , Rfl:    methocarbamol (ROBAXIN) 500 MG tablet, Take 500 mg by mouth 4 (four) times daily as needed. (Patient not taking: Reported on 04/03/2024), Disp: , Rfl:    Allergies: Allergies  Allergen Reactions   Amoxicillin-Pot Clavulanate Diarrhea, Nausea And Vomiting and Nausea Only    abdominal pain    REVIEW OF SYSTEMS:   Review of Systems  Constitutional:  Positive for fatigue.  Respiratory:  Positive for cough and shortness of breath.   Gastrointestinal:  Positive for constipation and nausea.  Neurological:  Positive for dizziness and headaches.     VITALS:   Blood pressure (!) 138/58, pulse 84, temperature 98.3 F (36.8 C), temperature source Tympanic, resp. rate 18, height 5' 2 (1.575 m), weight 112 lb 3.2 oz (50.9 kg), SpO2 96%.  Wt  Readings from Last 3 Encounters:  04/03/24 112 lb 3.2 oz (50.9 kg)  02/27/24 116 lb 3.2 oz (52.7 kg)  02/05/24 120 lb (54.4 kg)    Body mass index is 20.52 kg/m.  Performance status (ECOG): 1 - Symptomatic but completely ambulatory  PHYSICAL EXAM:   Physical Exam Constitutional:      Appearance: Normal appearance.  Cardiovascular:     Rate and Rhythm: Normal rate and regular rhythm.  Pulmonary:     Effort: Pulmonary effort is normal.     Breath sounds: Normal breath sounds.  Abdominal:     General: Bowel sounds are normal.     Palpations: Abdomen is soft.  Musculoskeletal:        General: No swelling. Normal range of motion.  Neurological:     Mental Status: She is alert and oriented to person, place, and time. Mental status is at baseline.   Breast Exam Chaperone: Christy Bohr, LPN   LABS:      Latest Ref Rng & Units 03/20/2024   11:01 AM 08/22/2023    9:16 AM 03/13/2023    1:52 PM  CBC  WBC 4.0 - 10.5 K/uL 7.1  6.2  5.1   Hemoglobin 12.0 - 15.0 g/dL 89.2  89.7  89.1   Hematocrit 36.0 - 46.0 % 33.1  33.0  33.3   Platelets 150 - 400 K/uL 306  251  244       Latest  Ref Rng & Units 03/20/2024   11:01 AM 02/05/2024    9:56 AM 08/22/2023    9:16 AM  CMP  Glucose 70 - 99 mg/dL 96  78  99   BUN 8 - 23 mg/dL 20  12  15    Creatinine 0.44 - 1.00 mg/dL 9.39  9.42  9.36   Sodium 135 - 145 mmol/L 142  142  138   Potassium 3.5 - 5.1 mmol/L 3.8  4.0  4.1   Chloride 98 - 111 mmol/L 103  105  102   CO2 22 - 32 mmol/L 28  24  27    Calcium 8.9 - 10.3 mg/dL 89.2  9.2  9.7   Total Protein 6.5 - 8.1 g/dL 6.6  5.8  5.2   Total Bilirubin 0.0 - 1.2 mg/dL 0.8  0.8  0.9   Alkaline Phos 38 - 126 U/L 102  93  89   AST 15 - 41 U/L 22  21  20    ALT 0 - 44 U/L 16  19  28       No results found for: CEA1, CEA / No results found for: CEA1, CEA No results found for: PSA1 No results found for: CAN199 Lab Results  Component Value Date   CAN125 39.0 (H) 09/16/2020    No results found for: TOTALPROTELP, ALBUMINELP, A1GS, A2GS, BETS, BETA2SER, GAMS, MSPIKE, SPEI Lab Results  Component Value Date   TIBC 328 03/21/2023   FERRITIN 85 03/21/2023   IRONPCTSAT 17 03/21/2023   No results found for: LDH   STUDIES:   MM 3D SCREENING MAMMOGRAM BILATERAL BREAST Result Date: 03/23/2024 CLINICAL DATA:  Screening. EXAM: DIGITAL SCREENING BILATERAL MAMMOGRAM WITH TOMOSYNTHESIS AND CAD TECHNIQUE: Bilateral screening digital craniocaudal and mediolateral oblique mammograms were obtained. Bilateral screening digital breast tomosynthesis was performed. The images were evaluated with computer-aided detection. COMPARISON:  Previous exam(s). ACR Breast Density Category c: The breasts are heterogeneously dense, which may obscure small masses. FINDINGS: There are no findings suspicious for malignancy. IMPRESSION: No mammographic evidence of malignancy. A  result letter of this screening mammogram will be mailed directly to the patient. RECOMMENDATION: Screening mammogram in one year. (Code:SM-B-01Y) BI-RADS CATEGORY  1: Negative. Electronically Signed   By: Dina  Arceo  M.D.   On: 03/23/2024 14:37

## 2024-04-04 ENCOUNTER — Other Ambulatory Visit: Payer: Self-pay

## 2024-04-04 DIAGNOSIS — F5104 Psychophysiologic insomnia: Secondary | ICD-10-CM

## 2024-04-05 ENCOUNTER — Other Ambulatory Visit: Payer: Self-pay

## 2024-04-05 NOTE — Telephone Encounter (Signed)
 Leita Mounts former dr for this pt prescribed it but she is now seeing Dr Tanda and needs a refill. Please advise.

## 2024-04-05 NOTE — Progress Notes (Signed)
 Vascular Clinical Note    Visit Date:  04/11/2024   Patient:  Christy Bennett     DOB:  Nov 04, 1955   Age: 68 y.o.   Gender: female  Phone Number: [phone]        Patient was referred by: No referring provider found  Patient Provider(s): System, Pcp Not In   Chief Complaint:  Chief Complaint  Patient presents with  . Post-Op Problem    2 week post op carotid bypass     History of the Present Illness:   Postoperative visit Procedure-right common carotid artery to axillary bypass with PTFE graft Date-03/26/2024 Recovery- as expected Significant Medications - Aspirin, Plavix , and Statin      MEDICATIONS PRIOR TO VISIT: Outpatient Encounter Medications as of 04/11/2024  Medication Sig Dispense Refill  . aspirin EC 81 mg Tablet, Delayed Release (E.C.) take 1 tablet every day by mouth . 100 tablet 0  . clopidogreL  (PLAVIX ) 75 mg Tablet take 1 tablet every day by mouth . 90 tablet 0  . HYDROcodone-acetaminophen  (NORCO) 5-325 mg Tablet take 1 tablet every 6 (six) hours as needed by mouth  for Pain. 7 tablet 0  . denosumab (PROLIA) 60 mg/mL Syringe 60 mg every six months by Subcutaneous route .    . [DISCONTINUED] ferrous sulfate 325 mg (65 mg iron) Tablet take 325 mg as directed by mouth  for Other TAKEN 3X/ WEEK, SUN, TUES AND THURS.    SABRA leflunomide (ARAVA) 20 mg Tablet TAKE 1 TABLET BY MOUTH IN THE MORNING 90 tablet 0  . [DISCONTINUED] hydrALAZINE  (APRESOLINE ) 10 mg Tablet take 1 tablet in the morning and 1 tablet before bedtime by mouth. 60 tablet 0  . valACYclovir  (VALTREX ) 500 mg Tablet take 2,000 mg as needed by mouth  for Other.    SABRA ascorbic acid (VITAMIN C) 500 mg Tablet take 500 mg by mouth .    . hydroxychloroquine (PLAQUENIL) 200 mg Tablet TAKE 1 & 1/2 (ONE & ONE-HALF) TABLETS BY MOUTH ONCE DAILY (Patient taking differently: take 200 mg every day by mouth  1 1/2 TABS A DAY.) 135 tablet 1  . sulfaSALAzine (AZULFIDINE) 500 mg Tablet take 2 tablets in the morning and  2 tablets before bedtime by mouth. 360 tablet 1  . simvastatin  (ZOCOR ) 10 mg Tablet TAKE 1 TABLET BY MOUTH ONCE DAILY AT NIGHT (Patient taking differently: take 10 mg every evening by mouth .)    . [DISCONTINUED] losartan  (COZAAR ) 25 mg Tablet Take 1 tablet by mouth once daily (Patient taking differently: take 50 mg every day by mouth .) 90 tablet 0  . buPROPion  SR (WELLBUTRIN  SR) 150 mg tablet sustained-release 12 hr Take 1 tablet by mouth twice daily 180 tablet 0  . [DISCONTINUED] levothyroxine  (SYNTHROID ) 137 mcg Tablet Take 1 tablet by mouth once daily (Patient taking differently: take 125 mcg every day by mouth .) 90 tablet 0  . [DISCONTINUED] ESOMEPRAZOLE  (NEXIUM ) 40 mg Capsule, Delayed Release(E.C.) TAKE 1 CAPSULE BY MOUTH DAILY (Patient taking differently: take 40 mg in the morning and 40 mg before bedtime by mouth.) 90 capsule 1  . [DISCONTINUED] traZODone  (DESYREL ) 100 mg Tablet TAKE 1 TABLET BY MOUTH ONCE DAILY AT NIGHT (Patient taking differently: take 100 mg every night by mouth .) 90 tablet 1  . verapamil  CR (CALAN -SR) 240 mg Tablet Sustained Release take 1 tablet every morning by mouth . (Patient taking differently: take 240 mg every evening by mouth .) 90 tablet 1  . vitamin E (AQUASOL  E) 400 unit Capsule take 400 Units every day by mouth .    . docusate sodium (COLACE) 50 mg Capsule take 50 mg as needed by mouth  for Constipation.    . B Complex Vitamins Capsule take 1 capsule every day by mouth .    . revefenacin  (YUPELRI ) 175 mcg/3 mL Solution for Nebulization take 3 mL every day by inhalation .    . Arformoterol  15 mcg/2 mL Solution for Nebulization take 2 mL in the morning and 2 mL before bedtime by inhalation.    . Diclofenac (VOLTAREN) 1 % Gel 4 g in the morning AND 4 g at noon AND 4 g before bedtime by Topical route. For joints above the belt the pt needs 3 tubes or 300 gms for 30 day supply.. (Patient taking differently: 4 g as needed by Topical route  for Pain For joints above  the belt the pt needs 3 tubes or 300 gms for 30 day supply.SABRA) 300 g 3  . [DISCONTINUED] Syringe with Needle, Disp, 1 mL 27 x 1/2 Syringe 0.5 mg by Does not apply route once a week 12 each 1  . [DISCONTINUED] calcium-vitamin D  (OSCAL 500 WITH D) 500 mg(1,250mg ) -200 unit Tablet take 1 tablet every day by mouth .    . [DISCONTINUED] umeclidinium-vilanteroL (UMECLIDINIUM - VILANTEROL) 62.5-25 mcg/actuation Disk with Device take 1 puff by inhalation Daily 3 Inhaler 1  . anastrozole  (ARIMIDEX ) 1 mg Tablet take 1 tablet every day by mouth .    . Chlorpheniramine-DM (CORICIDIN HBP COUGH AND COLD) 4-30 mg Tablet take 1 tablet every 4 hours as needed by mouth .    . [DISCONTINUED] CHOLECALCIFEROL, VITAMIN D3, (VITAMIN D3 PO) take 1,000 Units by mouth every day     . cetirizine (ZYRTEC) 10 mg Tablet take 10 mg every day by mouth .     No facility-administered encounter medications on file as of 04/11/2024.      PATIENT HISTORY: Past Medical History:  Diagnosis Date  . Anemia   . Anxiety disorder   . Back pain    low back-bulging disc  . Breast cancer (HHS-HCC) (CMS-HCC) 2020  . Bronchitis 05/2017  . Calculus kidney   . Cataract    right eye  . COPD (chronic obstructive pulmonary disease) (CMS-HCC)    moderate  . Delayed emergence from general anesthesia   . Dental bridge present   . Depression   . Difficulty swallowing    foods OCC, ONLY WHEN ESOPHAGUS NARROWS  . Dizziness   . Dyslipidemia   . Edema    lower extremities l>r  . Eye exam due to high risk medication, encounter for    HCQ checked JANUARY 2024  . Familial tremor   . GERD   . GERD (gastroesophageal reflux disease)   . H/O: lung cancer   . Hormone receptor positive breast cancer, left (HHS-HCC) (CMS-HCC) 09/02/2019  . Hypercholesterolemia 04/25/2009  . Hypertension   . Hypoglycemia   . Hypothyroidism   . HYPOTHYROIDISM NOS   . Joint pain   . Lung cancer (CMS-HCC) 2009   CHEMO AND RADIATION, Right upper lobe  .  Obstructive chronic bronchitis (CMS-HCC)   . Osteoporosis   . Peripheral vascular disease   . Pneumonia   . PONV (postoperative nausea and vomiting)   . Renal calculi   . Rheumatoid arthritis(714.0)   . Shortness of breath on exertion   . Sinusitis   . Snoring   . Tobacco use disorder   .  Transfusion history   . Wears glasses       SURGICAL HISTORY: Past Surgical History:  Procedure Laterality Date  . HB STENT PERIPHERAL [id number]  2008   L ileofemoral 2008  . HX BREAST LUMPECTOMY    . HX COLONOSCOPY  11/2007  . HX EGD    . HX FEMORAL BYPASS  2006   R fem pop 2006  . PR BYP OTH/THN VEIN CAROTID-SUBCLAVIAN Right 03/26/2024   Procedure: CAROTID,BYPASS GRAFT,WITH OTHER THAN VEIN; CAROTID-SUBCLAVIAN;  Surgeon: Rennie Lynwood MATSU, MD;  Location: [facility] VASCULAR OR  . PR BYP OTH/THN VEIN FEMORAL-POPLITEAL Right 10/19/2016   Procedure: DEADRA LAMB GRAFT,WITH OTHER THAN VEIN;  Surgeon: Rennie Lynwood MATSU, MD;  Location: [facility] VASCULAR OR  . PR BYP OTH/THN VEIN FEMORAL-POPLITEAL Right 02/10/2017   Procedure: DEADRA LAMB GRAFT,WITH OTHER THAN VEIN;  Surgeon: Rennie Lynwood MATSU, MD;  Location: [facility] VASCULAR OR  . PR COLONOSCOPY FLX DX W/COLLJ SPEC WHEN PFRMD N/A 11/02/2020   Procedure: COLONOSCOPY (ENDO) DX FLEXIBLE W/ WO SPECIMEN COLLECTION BY BRUSHING OR WASHING;  Surgeon: Webster Redell CROME, MD;  Location: [facility] OR  . PR CONIZATION CERVIX W/WO D&C RPR KNIFE/LASER  1990  . PR CYSTOURETHROSCOPY W/INTERNAL URETHROTOMY FEMALE  2000  . PR DILATION ESOPH UNGUIDED SOUND/BOUGIE 1/MULT PASS N/A 11/02/2020   Procedure: ESOPHAGUS DILATION SOUND OR BOUGIE SINGLE OR MULTIPLE PASSES (ENDO);  Surgeon: Webster Redell CROME, MD;  Location: [facility] OR  . PR EGD TRANSORAL BIOPSY SINGLE/MULTIPLE N/A 11/02/2020   Procedure: EGD (ENDO) ESOPHAGOGASTRODUODENOSCOPY W/BIOPSY SINGLE OR MULTIPLE;  Surgeon: Webster Redell CROME, MD;  Location: [facility] OR  . PR EMBLC/THRMBC FEMORAL POPLITEAL AORTO-ILIAC ART Right  02/10/2017   Procedure: FEMOROPOPLITEAL ARTERY, EMBOLECTOMY OR THROMBECTOMY, WITH OR WITHOUT CATHETER; BY LEG INCISION;  Surgeon: Rennie Lynwood MATSU, MD;  Location: [facility] VASCULAR OR  . PR EXCISION INFECTED GRAFT EXTREMITY Right 02/10/2017   Procedure: MARDENE GIN OF INFECTED GRAFT;  Surgeon: Rennie Lynwood MATSU, MD;  Location: [facility] VASCULAR OR     DRUG ALLERGIES: Allergies  Allergen Reactions  . Augmentin [Amoxicillin-Pot Clavulanate] Other - See Comments    Diarrhea, abdominal pain        FAMILY HISTORY: Family History  Problem Relation Name Age of Onset  . High Cholesterol Mother    . Hypertension Mother    . Diabetes Mother    . Arthritis-osteo Mother    . No Known Problems Father    . No Known Problems Brother    . No Known Problems Sister    . No Known Problems Maternal Aunt    . No Known Problems Maternal Uncle    . No Known Problems Paternal Aunt    . No Known Problems Paternal Uncle    . No Known Problems Maternal Grandmother    . No Known Problems Maternal Grandfather    . No Known Problems Paternal Grandmother    . No Known Problems Paternal Grandfather    . Cancer Neg Hx    . Breast Cancer Neg Hx    . Colon Cancer Neg Hx    . Lung Cancer Neg Hx    . Ovarian Cancer Neg Hx    . Prostate Cancer Neg Hx    . Coronary Artery Disease Neg Hx    . Heart Failure Neg Hx    . Stroke Neg Hx    . Asthma Neg Hx    . Arthritis-rheumatoid Neg Hx    . Migraines Neg Hx    . Seizures Neg Hx    .  Thyroid Disease Neg Hx       SOCIAL HISTORY: Social History   Tobacco Use  . Smoking status: Former    Current packs/day: 0.00    Average packs/day: 0.5 packs/day for 35.0 years (17.5 ttl pk-yrs)    Types: Cigarettes    Start date: 04/11/1980    Quit date: 04/12/2015    Years since quitting: 9.0    Passive exposure: Past  . Smokeless tobacco: Never  . Tobacco comments:    since age 22  Vaping Use  . Vaping status: Never Used  Substance Use Topics  . Alcohol use: Yes     Comment: Alcoholic Drinks/day: rarely  . Drug use: No      REVIEW OF SYSTEMS: Eliot Bencivenga 1955/10/01  Review of Systems  [] Yes  No[x]  Chills [] Yes  No[x]  Fatigue [] Yes  No[x]  Fever [] Yes  No[x]  Weight gain [] Yes  No[x]  Weight Loss [] Yes  No[x]  Blood clots (DVT or PE) [] Yes  No[x]  Chest pain with exertion [] Yes  No[x]  Chest pain/pressure [] Yes  No[x]  Fainting/blackouts/pass out [] Yes  No[x]  Heart murmur [] Yes  No[x]  Leg pain w/walking [] Yes  No[x]  Leg swelling [] Yes  No[x]  Varicose veins [] Yes  No[x]  Irregular heartbeat [] Yes  No[x]  Wheezing [] Yes  No[x]  Shortness of breath [] Yes  No[x]  Feet aches, pain, weakness, numbness [] Yes  No[x]  Arthritis [x] Yes  No[]  Back Pain [] Yes  No[x]  Muscle weakness [x] Yes  No[]  Hip aches, pain, weakness, numbness [] Yes  No[x]  Knee aches, pain, weakness, numbness [] Yes  No[x]  Memory loss [] Yes  No[x]  Motor weakness [] Yes  No[x]  Paralysis [] Yes  No[x]  Speech difficulties [] Yes  No[x]  Numbness [] Yes  No[x]  Rash [] Yes  No[x]  Trouble swallowing [] Yes  No[x]  Hoarseness [] Yes  No[x]  Voice change [] Yes  No[x]  Vision change [] Yes  No[x]  Abdominal pain after eating [] Yes  No[x]  Blood in stool [] Yes  No[x]  Constipation [] Yes  No[x]  Diarrhea [] Yes  No[x]  Nausea/vomiting [] Yes  No[x]  Blood in urine [] Yes  No[x]  Urinary retention/hesitancy [] Yes  No[x]  Abnormal bleeding & bruising [] Yes  No[x]  Lymph node enlargement/mass [] Yes  No[x]  Blood clot in lungs [] Yes  No[x]  Swollen glands      VITALS: Ht 1.575 m (5' 2)   Wt 52.2 kg (115 lb)   BMI 21.03 kg/m           PHYSICAL EXAM:  Constitutional: general appearance- overall: in no acute distress, well developed and well nourished Neck: inspection of neck- overall: no carotid bruits and no masses, incision healing nicely Eyes: conjunctiva/eyelids- overall: conjunctiva clear and eyelids normal Respiratory: auscultation- overall: breath sounds clear bilaterally;  respiratory effort/rhythm; overall: no retractions and normal rate Cardiovascular: auscultation of heart- overall: regular rate and normal heart sounds; palpation of radial pulses; bilateral radial pulses normal; palpation of carotid pulses; overall: strong, bilaterally equal, no bruits, examination of extremities; overall: no edema and well perfused; 2+ graft pulse, + R PT signal Integument: inspection of skin- overall: no rash, lesions, incision healed Musculoskeletal: digits and nails- overall: no clubbing and digits benign Neurologic: sensation- overall: intact; mental status; overall: awake, alert and oriented x 3; motor; overall: normal bulk, tone; mood and affect; overall: normal mood and affect             ASSESSMENT:   ICD-10-CM   1. Subclavian artery stenosis, right (HCC)  I77.1 CAROTID/VERTEBRAL DUPLEX UNILATERAL    2. Surgical follow-up care  Z09         PLAN:  Patient presents today for post operative visit. Patient with history of a right deep femoral to popliteal artery bypass on 02/10/2017. Patient underwent right common carotid artery to axillary bypass with PTFE graft on 03/26/2024. Incisions healing nicely. 2+ radial pulse. Patient denies any worrisome neurological symptoms. Patient will continue aspirin and statin daily. Patient will return in 3 months for carotid duplex.  She tells me she had to go to the ER last night due to right hip/leg pain that radiated down her posterior thigh. She has 2+ graft pulse and excellent PT signal, foot is warm and well-perfused. We have provided reassurance from an arterial standpoint. We have sent a note to her spine provider for further evaluation.      Christin E Furrow, NP-C 04/11/2024 2:36 PM    Voice recognition may have been used for the dictation of this note and related grammatical errors or word substitutions may be present.

## 2024-04-07 LAB — COPPER, SERUM: Copper: 165 ug/dL — ABNORMAL HIGH (ref 80–158)

## 2024-04-09 LAB — METHYLMALONIC ACID, SERUM: Methylmalonic Acid, Quantitative: 168 nmol/L (ref 0–378)

## 2024-04-12 ENCOUNTER — Other Ambulatory Visit

## 2024-04-12 ENCOUNTER — Encounter (HOSPITAL_COMMUNITY): Payer: Self-pay | Admitting: *Deleted

## 2024-04-12 ENCOUNTER — Telehealth: Payer: Self-pay

## 2024-04-12 ENCOUNTER — Other Ambulatory Visit: Payer: Self-pay

## 2024-04-12 ENCOUNTER — Emergency Department (HOSPITAL_COMMUNITY)
Admission: EM | Admit: 2024-04-12 | Discharge: 2024-04-12 | Disposition: A | Discharge status: Home / Self Care | Attending: Emergency Medicine | Admitting: Emergency Medicine

## 2024-04-12 DIAGNOSIS — I10 Essential (primary) hypertension: Secondary | ICD-10-CM | POA: Diagnosis not present

## 2024-04-12 DIAGNOSIS — M5431 Sciatica, right side: Secondary | ICD-10-CM | POA: Diagnosis not present

## 2024-04-12 DIAGNOSIS — Z853 Personal history of malignant neoplasm of breast: Secondary | ICD-10-CM | POA: Diagnosis not present

## 2024-04-12 DIAGNOSIS — J449 Chronic obstructive pulmonary disease, unspecified: Secondary | ICD-10-CM | POA: Insufficient documentation

## 2024-04-12 DIAGNOSIS — Z7982 Long term (current) use of aspirin: Secondary | ICD-10-CM | POA: Diagnosis not present

## 2024-04-12 DIAGNOSIS — M25551 Pain in right hip: Secondary | ICD-10-CM | POA: Diagnosis present

## 2024-04-12 MED ORDER — OXYCODONE-ACETAMINOPHEN 5-325 MG PO TABS: 1.0000 | ORAL_TABLET | Freq: Four times a day (QID) | ORAL | 0 refills | Status: DC | PRN

## 2024-04-12 MED ORDER — OXYCODONE HCL 5 MG PO TABS
10.0000 mg | ORAL_TABLET | Freq: Once | ORAL | Status: AC
  Administered 2024-04-12: 10 mg via ORAL
  Filled 2024-04-12: qty 2

## 2024-04-12 NOTE — Telephone Encounter (Signed)
 Called patient per an in basket from [employer] that she received a message from Vascular to schedule an appt. I called pt. To schedule in 6wks as provider asked, patient declined to schedule stating she couldn't wait that long and was calling vascular back to see if they could get her in sooner. I explained we received the message from them she still declined to schedule. Ashlyn Baird notified in an basket .   Camelia JONETTA Fiedler, CMA 04/12/2024 11:36 AM

## 2024-04-12 NOTE — Telephone Encounter (Addendum)
 Pt arrived for lab visit today. Patient stated she was told by Dr. Tanda to come in today to have her TSH and freeT4 labs drawn.  I did not see any future lab orders placed by Dr. Tanda in pt's chart. There is no mention of future labs in pt's last OV from 02/27/24.  Due to not seeing orders in chart, I did not draw labs today.  Patient was upset.

## 2024-04-12 NOTE — Discharge Instructions (Signed)
 Your pain is likely due to a pinched nerve.  Take medication as prescribed.  You may take opiate pain medication given for better pain control but do not take it with other opiate medication together as it can cause significant drowsiness.  Do not operate heavy machinery or drive a vehicle while taking opiate pain medication.  Follow-up closely with your neurosurgeon for outpatient management.  You may return if you have any concern.

## 2024-04-12 NOTE — Telephone Encounter (Signed)
 Patient asking for an appt. States she is having right hip pain going down the leg. States Dr. Rennie' office advised her to see Neurosurgery. Patient states she can't wait 6 weeks to be seen and wants to be seen as soon as she can.  Appt scheduled, details given to patient, she verbalized understanding.  Future Appointments  Date Time Provider Department Center  04/16/2024 10:30 AM Baird, Ashlyn B, PA-C [facility] [address]  06/12/2024  2:00 PM Charlott Lupita RAMAN, DO [facility]  07/11/2024  1:15 PM POSTAL VASCULAR ROOM 1 [facility]  07/11/2024  2:00 PM Rennie Lynwood MATSU, MD [facility]  11/28/2024 12:30 PM POSTAL VASCULAR ROOM 3 [facility]  11/28/2024  1:15 PM Drougas, Lynwood MATSU, MD [facility]   Routing to [employer], PA-C and Crystal Thornhill for us airways.   Tully JINNY Millard, RN 04/12/2024 1:48 PM

## 2024-04-12 NOTE — ED Provider Notes (Signed)
 Christy Bennett EMERGENCY DEPARTMENT AT [facility] Provider Note   CSN: [id number] Arrival date & time: 04/12/24  1443     Patient presents with: Hip Pain   Christy Bennett is a 68 y.o. female.   The history is provided by the patient and medical records. No language interpreter was used.  Hip Pain     68 year old female with history lumbar herniated disc, anxiety disorder, breast cancer, COPD, hypertension, rheumatoid arthritis, peripheral vascular disease who presents with complaint of leg pain.  Patient states 3 days ago she noticed pain to her lower back that radiates down to her right leg.  Pain was quite intense for which she went to the emergency department to be evaluated.  She was given steroid, muscle relaxant, anti-inflammatory medication and opiate pain medication. the medication did help temporarily and she was able to see her vascular surgeon yesterday for a follow-up since she recently had a right common carotid artery to artery bypass with PTFE graft on November 2025.  At that time she did mention about her hip pain and her leg pain and she was told that she may benefit from follow-up with a neurosurgeon for outpatient evaluation and possibly an MRI.  She did reach out to neurosurgery but will not have an appointment until January of next year.  This morning she noticed pain to her right leg including hip that returns despite using heat and ice and taking the medication that was prescribed thus prompting this ED visit.  Otherwise patient denies having any fever or chills no abdominal pain no bowel bladder incontinence or saddle anesthesia.  She denies any focal numbness or focal weakness.  She denies any trauma.  She also denies any rash.   Prior to Admission medications   Medication Sig Start Date End Date Taking? Authorizing Provider  albuterol  (PROAIR  HFA) 108 (90 Base) MCG/ACT inhaler Inhale 2 puffs into the lungs every 4 (four) hours as needed for shortness of  breath or wheezing. 03/09/23   Darlean Ozell NOVAK, MD  anastrozole  (ARIMIDEX ) 1 MG tablet Take 1 tablet (1 mg total) by mouth daily. 06/19/23   Rogers Hai, MD  arformoterol  (BROVANA ) 15 MCG/2ML NEBU Take 2 mLs (15 mcg total) by nebulization in the morning and at bedtime. 12/12/23   Darlean Ozell NOVAK, MD  ascorbic acid (VITAMIN C) 500 MG tablet Take 500 mg by mouth daily.    [provider]  aspirin EC 81 MG tablet Take 81 mg by mouth daily.    [provider]  b complex vitamins capsule Take 1 capsule by mouth daily.    [provider]  buPROPion  (WELLBUTRIN  SR) 150 MG 12 hr tablet Take 1 tablet by mouth twice daily 02/20/24   Bevely Doffing, FNP  Calcium Carb-Cholecalciferol (CALCIUM 600/VITAMIN D  PO) Take 1 tablet by mouth. 1200 one daily    [provider]  cetirizine (ZYRTEC) 10 MG tablet Take 10 mg by mouth daily.    [provider]  clopidogrel  (PLAVIX ) 75 MG tablet Take 1 tablet by mouth once daily 02/05/24   Bevely Doffing, FNP  denosumab (PROLIA) 60 MG/ML SOSY injection Inject 60 mg into the skin every 6 (six) months.    Curt Myla SAILOR, RN  diclofenac Sodium (VOLTAREN) 1 % GEL Apply 2 g topically 3 (three) times daily as needed (pain). 08/25/20   [provider]  docusate sodium (COLACE) 50 MG capsule Take 50 mg by mouth as needed for mild constipation.    [provider]  esomeprazole  (NEXIUM ) 20 MG capsule TAKE 1 CAPSULE BY MOUTH 2 TIMES A DAY 02/19/24   Carlan, Chelsea L, NP  ferrous sulfate 325 (65 FE) MG tablet Take 325 mg by mouth every other day.    [provider]  hydrALAZINE  (APRESOLINE ) 10 MG tablet Take 1 tablet (10 mg total) by mouth 3 (three) times daily. 03/26/24   Tanda Bleacher, MD  HYDROcodone-acetaminophen  (NORCO/VICODIN) 5-325 MG tablet Take 1 tablet by mouth. Patient not taking: Reported on 04/03/2024 03/27/24   [provider]  hydroxychloroquine (PLAQUENIL) 200 MG tablet Take 300 mg by  mouth daily.    [provider]  leflunomide (ARAVA) 20 MG tablet Take 20 mg by mouth daily. 04/23/20   [provider]  levothyroxine  (SYNTHROID ) 125 MCG tablet Take 1 tablet (125 mcg total) by mouth daily. 02/07/24   Bevely Doffing, FNP  losartan  (COZAAR ) 50 MG tablet Take 1 tablet (50 mg total) by mouth daily. 02/19/24   Bevely Doffing, FNP  meclizine (ANTIVERT) 25 MG tablet Take 25 mg by mouth 3 (three) times daily as needed for dizziness.    [provider]  methocarbamol (ROBAXIN) 500 MG tablet Take 500 mg by mouth 4 (four) times daily as needed. Patient not taking: Reported on 04/03/2024 01/10/24   [provider]  revefenacin  (YUPELRI ) 175 MCG/3ML nebulizer solution Take 3 mLs (175 mcg total) by nebulization daily. 03/04/24   Darlean Ozell NOVAK, MD  simvastatin  (ZOCOR ) 10 MG tablet Take 1 tablet (10 mg total) by mouth daily. 01/16/24   Bevely Doffing, FNP  sulfaSALAzine (AZULFIDINE) 500 MG tablet Take 500-1,000 mg by mouth See admin instructions. Taking 500 mg by mouth in the morning and 1000 mg in the evening    [provider]  traZODone  (DESYREL ) 150 MG tablet TAKE 1 TABLET BY MOUTH AT BEDTIME 01/11/24   Bevely Doffing, FNP  valACYclovir  (VALTREX ) 500 MG tablet Take 4 tablets (2,000 mg total) by mouth 2 (two) times daily as needed (fever blisters). 01/04/23   Melvenia Manus BRAVO, MD  verapamil  (VERELAN ) 240 MG 24 hr capsule Take 1 capsule (240 mg total) by mouth daily. 01/16/24   Bevely Doffing, FNP  Vitamin E 268 MG (400 UNIT) TABS Take 400 Units by mouth daily.    [provider]    Allergies: Amoxicillin-pot clavulanate    Review of Systems  All other systems reviewed and are negative.   Updated Vital Signs BP (!) 171/65 (BP Location: Right Arm)   Pulse 86   Temp 98.3 F (36.8 C) (Oral)   Resp 20   Wt 50.8 kg   SpO2 94%   BMI 20.49 kg/m   Physical Exam Abdominal:     Palpations: Abdomen is soft.     Tenderness: There is no  abdominal tenderness.  Musculoskeletal:        General: No tenderness.     Comments: Mild tenderness noted to right lateral hip with normal hip range of motion and no overlying skin changes.  No significant midline spine tenderness.  Negative straight leg raise.  Intact dorsalis pedis pulse with brisk cap refill.  5 out of 5 strength bilateral lower extremities and leg compartments are soft.  Able to ambulate     (all labs ordered are listed, but only abnormal results are displayed) Labs Reviewed - No data to display  EKG: None  Radiology: No results found.   Procedures   Medications Ordered in the ED  oxyCODONE  (Oxy IR/ROXICODONE ) immediate release  tablet 10 mg (10 mg Oral Given 04/12/24 1605)                                    Medical Decision Making  BP (!) 171/65 (BP Location: Right Arm)   Pulse 86   Temp 98.3 F (36.8 C) (Oral)   Resp 20   Wt 50.8 kg   SpO2 94%   BMI 20.49 kg/m   52:60 PM 68 year old female with history lumbar herniated disc, anxiety disorder, breast cancer, COPD, hypertension, rheumatoid arthritis, peripheral vascular disease who presents with complaint of leg pain.  Patient states 3 days ago she noticed pain to her lower back that radiates down to her right leg.  Pain was quite intense for which she went to the emergency department to be evaluated.  She was given steroid, muscle relaxant, anti-inflammatory medication and opiate pain medication. the medication did help temporarily and she was able to see her vascular surgeon yesterday for a follow-up since she recently had a right common carotid artery to artery bypass with PTFE graft on November 2025.  At that time she did mention about her hip pain and her leg pain and she was told that she may benefit from follow-up with a neurosurgeon for outpatient evaluation and possibly an MRI.  She did reach out to neurosurgery but will not have an appointment until January of next year.  This morning she noticed  pain to her right leg including hip that returns despite using heat and ice and taking the medication that was prescribed thus prompting this ED visit.  Otherwise patient denies having any fever or chills no abdominal pain no bowel bladder incontinence or saddle anesthesia.  She denies any focal numbness or focal weakness.  She denies any trauma.  She also denies any rash.  On exam patient does have some mild tenderness to her right lateral hip however she is able to range her hip without difficulty.  She has 5 out of 5 strength bilateral lower extremities with intact distal pulses.  She does not have any significant midline spine tenderness.  She is able to ambulate.  She mentioned that she feels better after receiving a dose of opiate pain medication here.  She does not have any red flags warranting emergent MRI of her lumbar spine however her symptoms does suggestive of sciatica.  Will provide patient a short course of stronger opiate pain medication for better pain control encourage patient to follow-up outpatient for further care.  Patient voiced understanding and agrees with plan.  Doubt cauda equina     Final diagnoses:  Sciatica, right side    ED Discharge Orders          Ordered    oxyCODONE -acetaminophen  (PERCOCET) 5-325 MG tablet  Every 6 hours PRN        04/12/24 1651               Nivia Colon, PA-C 04/12/24 1652    Doretha Folks, MD 04/12/24 2304

## 2024-04-12 NOTE — ED Triage Notes (Addendum)
 BIB husband from home for R hip pain. Onset 4d ago and seen in ED 2d ago in [place]. Describes as started in back, radiated to buttocks, hip and down R leg. No relief with steroid injection, prednisone , robaxin, motrin and norco. Referred to NSURG. Rates pain 8/10. Has fluctuated daily with good days and bad days. Reports difficulty bearing weight and walking. Endorses numbness, tingling and pain. Denies any red flag sx. Denies fall or injury. Denies CP, current back pain, abd pain, NVD, dizziness, fever, urinary or vaginal sx, saddle paresthesia. Steady gait. Alert, NAD, calm, interactive. Recent vascular bypass surgery in [place]. Verbalizes awaiting suggested MRI and NSURG appt, can't wait 6 weeks. See notes in EMR/ chart.

## 2024-04-12 NOTE — ED Provider Triage Note (Signed)
 Emergency Medicine Provider Triage Evaluation Note  Christy Bennett , a 68 y.o. female  was evaluated in triage. Pt complains of pain in the right thigh and hip area.  Symptoms started over the past 3 or so days.  This is a new problem although she does report remote history of surgery in the back for bulging disc.  She has a history of bypass surgery in her leg.  Saw vascular surgery yesterday, they thought that she had lumbar radiculopathy, referred to neurosurgery.  Patient took muscle relaxer, Motrin, Norco that she had leftover from recent subclavian bypass surgery -- without improvement.  Denies loss of bowel or bladder.  She denies weakness in the right leg but has difficulty bearing weight because of pain.  Review of Systems  Positive: Hip and thigh pain Negative: Bowel and bladder incontinence  Physical Exam  BP (!) 176/86   Pulse 89   Temp 98.3 F (36.8 C) (Oral)   Resp 18   Wt 50.8 kg   SpO2 92%   BMI 20.49 kg/m  Gen:   Awake, appears uncomfortable Resp:  Normal effort  MSK:   Moves extremities without difficulty  Other:  Lower extremities well-perfused.  Medical Decision Making  Medically screening exam initiated at 3:59 PM.  Appropriate orders placed.  Christy Bennett was informed that the remainder of the evaluation will be completed by another provider, this initial triage assessment does not replace that evaluation, and the importance of remaining in the ED until their evaluation is complete.  Ordered 10 mg oxycodone  while patient is waiting.   Desiderio Chew, PA-C 04/12/24 1601

## 2024-04-12 NOTE — Telephone Encounter (Signed)
 Patient called to report that her follow-up with the spine doctor is in January and that she cannot wait that long because of her pain.  I have encouraged her to call the office and be added to the cancellation list in case she can get in sooner.

## 2024-04-15 ENCOUNTER — Telehealth: Payer: Self-pay | Admitting: Family Medicine

## 2024-04-15 NOTE — Telephone Encounter (Signed)
 MRN #  [id number]   Call Back Number: [phone]  Date of Last Office Visit: 04/12/2024     Date of Next Office Visit: 05/29/2024    Medication(s) to be Refilled: Trazadone 150mg   Preferred [facility]acy: [facility], [place]

## 2024-04-16 ENCOUNTER — Other Ambulatory Visit: Payer: Self-pay | Admitting: Family Medicine

## 2024-04-16 DIAGNOSIS — F5104 Psychophysiologic insomnia: Secondary | ICD-10-CM

## 2024-04-16 MED ORDER — TRAZODONE HCL 150 MG PO TABS: 150.0000 mg | ORAL_TABLET | Freq: Every day | ORAL | 0 refills | Status: DC

## 2024-05-02 ENCOUNTER — Other Ambulatory Visit: Payer: Self-pay

## 2024-05-17 ENCOUNTER — Other Ambulatory Visit: Payer: Self-pay

## 2024-05-18 ENCOUNTER — Other Ambulatory Visit (INDEPENDENT_AMBULATORY_CARE_PROVIDER_SITE_OTHER): Payer: Self-pay | Admitting: Gastroenterology

## 2024-05-20 ENCOUNTER — Encounter: Payer: Self-pay | Admitting: *Deleted

## 2024-05-22 ENCOUNTER — Ambulatory Visit: Admitting: Internal Medicine

## 2024-05-22 ENCOUNTER — Telehealth: Payer: Self-pay

## 2024-05-22 DIAGNOSIS — J449 Chronic obstructive pulmonary disease, unspecified: Secondary | ICD-10-CM

## 2024-05-22 NOTE — Progress Notes (Deleted)
 "  Christy Bennett, female    DOB: [birth year]   MRN: [id number]   Brief patient profile:  8  yowf quit smoking 2016  self referred to pulmonary clinic 07/28/2022  with doe/fatigue post covid   06/2022    Dx with lung ca 2009 RT to Right  and chemo in [place] with scarring RUL on last cxrs from 2022    History of Present Illness  07/28/2022  Pulmonary/ 1st office eval/[name]  Chief Complaint  Patient presents with   Consult    Self referral, sob with exertion, decreased energy, had Covid 1 mth. ago  Baseline= yardwork / back and forth to [place] of [place] stop twice walking up to a mile in 30 min slt inclines  then covid Jun 24 2022 fatigue, cough, st, green mucus from nose and after a few weeks main problem fatigue   Dyspnea:  back doing yardwork / hasn't tried [place]/  short of breath but not stopping at top of steps   Cough: some cough at hs min white no green/ some am mucus p few min Sleep: level bed with pillows. SABA use: on neb brovana / yupelri   but some better p pred from [facility] Rec Try yupelri  and brovana  1st thing in am and about 12 hours later use the brovana   Also  Ok to try albuterol  15 min before an activity (on alternating days)  that you know would usually make you short of breath    If not satisfied try Prednisone  10 mg 2 daily x 5 days, 1 daily x 5 days and stop   Please schedule a follow up office visit in 6 weeks, call sooner if needed in  [place] - bring inhaler    09/08/2022  f/u ov/[place] office/Graciella Arment re: doe/ fatigue post covid 06/2022  maint on lama/laba bid neb   Chief Complaint  Patient presents with   Follow-up    Pt f/u states that her breathing has been good recently  Dyspnea:  yard work is fine  Cough: minimal  Sleeping: level bed / 2 pillows SABA use: rarely  - doe not help prior to ex  02: none  Rec No change in medications except ok to leave off the evening nebulizer if doing great      03/13/2023  f/u ov/[place] office/[name]  re: GOLD 2 copd doe/ fatigue post covid  2024  maint on brovana  10 am  and yupelri  q pm   Chief Complaint  Patient presents with   Pharyngoesophageal dysphagia  Dyspnea:  limited by feet, steps sometimes but doesn't have to stop at top  Cough: clear mucus/rattling esp in am / mucinex helps  Sleeping: bed is flat two pillows/ s  resp cc  SABA use: none 02: none Rec Nexium  should be Take 30- 60 min before your first and last meals of the day  GERD diet reviewed, bed blocks rec  For cough/ congestion >   Mucinex dm  up to maximum of  1200 mg every 12 hours as needed  Brovana  1st thing in am and chase it with yupelri  and the pm brovana  dose 12 hours later  is ok to leave off if doing great.  Please schedule a follow up visit in 12  months but call sooner if needed     05/22/2024 yearly  f/u ov/ office/[name] re: *** maint on ***  No chief complaint on file.   Dyspnea:  *** Cough: *** Sleeping: ***   resp cc  SABA use: *** 02: ***  Lung cancer screening: ***   No obvious day to day or daytime variability or assoc excess/ purulent sputum or mucus plugs or hemoptysis or cp or chest tightness, subjective wheeze or overt sinus or hb symptoms.    Also denies any obvious fluctuation of symptoms with weather or environmental changes or other aggravating or alleviating factors except as outlined above   No unusual exposure hx or h/o childhood pna/ asthma or knowledge of premature birth.  Current Allergies, Complete Past Medical History, Past Surgical History, Family History, and Social History were reviewed in [employer] record.  ROS  The following are not active complaints unless bolded Hoarseness, sore throat, dysphagia, dental problems, itching, sneezing,  nasal congestion or discharge of excess mucus or purulent secretions, ear ache,   fever, chills, sweats, unintended wt loss or wt gain, classically pleuritic or exertional cp,  orthopnea pnd or arm/hand  swelling  or leg swelling, presyncope, palpitations, abdominal pain, anorexia, nausea, vomiting, diarrhea  or change in bowel habits or change in bladder habits, change in stools or change in urine, dysuria, hematuria,  rash, arthralgias, visual complaints, headache, numbness, weakness or ataxia or problems with walking or coordination,  change in mood or  memory.         Outpatient Medications Prior to Visit  Medication Sig Dispense Refill   albuterol  (PROAIR  HFA) 108 (90 Base) MCG/ACT inhaler Inhale 2 puffs into the lungs every 4 (four) hours as needed for shortness of breath or wheezing. 18 g 1   anastrozole  (ARIMIDEX ) 1 MG tablet Take 1 tablet (1 mg total) by mouth daily. 90 tablet 3   arformoterol  (BROVANA ) 15 MCG/2ML NEBU Take 2 mLs (15 mcg total) by nebulization in the morning and at bedtime. 60 mL 4   ascorbic acid (VITAMIN C) 500 MG tablet Take 500 mg by mouth daily.     aspirin EC 81 MG tablet Take 81 mg by mouth daily.     b complex vitamins capsule Take 1 capsule by mouth daily.     buPROPion  (WELLBUTRIN  SR) 150 MG 12 hr tablet Take 1 tablet by mouth twice daily 180 tablet 0   Calcium Carb-Cholecalciferol (CALCIUM 600/VITAMIN D  PO) Take 1 tablet by mouth. 1200 one daily     cetirizine (ZYRTEC) 10 MG tablet Take 10 mg by mouth daily.     clopidogrel  (PLAVIX ) 75 MG tablet Take 1 tablet by mouth once daily 90 tablet 0   denosumab (PROLIA) 60 MG/ML SOSY injection Inject 60 mg into the skin every 6 (six) months.     diclofenac Sodium (VOLTAREN) 1 % GEL Apply 2 g topically 3 (three) times daily as needed (pain).     docusate sodium (COLACE) 50 MG capsule Take 50 mg by mouth as needed for mild constipation.     esomeprazole  (NEXIUM ) 20 MG capsule TAKE 1 CAPSULE BY MOUTH 2 TIMES A DAY 180 capsule 0   ferrous sulfate 325 (65 FE) MG tablet Take 325 mg by mouth every other day.     hydrALAZINE  (APRESOLINE ) 10 MG tablet Take 1 tablet (10 mg total) by mouth 3 (three) times daily. 90 tablet 1    HYDROcodone-acetaminophen  (NORCO/VICODIN) 5-325 MG tablet Take 1 tablet by mouth. (Patient not taking: Reported on 04/03/2024)     hydroxychloroquine (PLAQUENIL) 200 MG tablet Take 300 mg by mouth daily.     leflunomide (ARAVA) 20 MG tablet Take 20 mg by mouth daily.     levothyroxine  (  SYNTHROID ) 125 MCG tablet Take 1 tablet (125 mcg total) by mouth daily. 90 tablet 2   losartan  (COZAAR ) 50 MG tablet Take 1 tablet (50 mg total) by mouth daily. 30 tablet 1   meclizine (ANTIVERT) 25 MG tablet Take 25 mg by mouth 3 (three) times daily as needed for dizziness.     methocarbamol (ROBAXIN) 500 MG tablet Take 500 mg by mouth 4 (four) times daily as needed. (Patient not taking: Reported on 04/03/2024)     oxyCODONE -acetaminophen  (PERCOCET) 5-325 MG tablet Take 1 tablet by mouth every 6 (six) hours as needed for moderate pain (pain score 4-6) or severe pain (pain score 7-10). 10 tablet 0   revefenacin  (YUPELRI ) 175 MCG/3ML nebulizer solution Take 3 mLs (175 mcg total) by nebulization daily. 90 mL 1   simvastatin  (ZOCOR ) 10 MG tablet Take 1 tablet (10 mg total) by mouth daily. 90 tablet 1   sulfaSALAzine (AZULFIDINE) 500 MG tablet Take 500-1,000 mg by mouth See admin instructions. Taking 500 mg by mouth in the morning and 1000 mg in the evening     traZODone  (DESYREL ) 150 MG tablet Take 1 tablet (150 mg total) by mouth at bedtime. 90 tablet 0   valACYclovir  (VALTREX ) 500 MG tablet Take 4 tablets (2,000 mg total) by mouth 2 (two) times daily as needed (fever blisters). 20 tablet 1   verapamil  (VERELAN ) 240 MG 24 hr capsule Take 1 capsule (240 mg total) by mouth daily. 90 capsule 1   Vitamin E 268 MG (400 UNIT) TABS Take 400 Units by mouth daily.     No facility-administered medications prior to visit.           Past Medical History:  Diagnosis Date   Allergy    COPD (chronic obstructive pulmonary disease) (HCC)    Depression    GERD (gastroesophageal reflux disease)    Hyperlipemia    Hypertension     Hypothyroid    Lung cancer (HCC) 2009   Osteoporosis    PVD (peripheral vascular disease) (HCC)    RA (rheumatoid arthritis) (HCC)       Objective:    Wts  05/22/2024       ***  03/13/2023      119  09/08/2022       121   07/28/22 118 lb 6.4 oz (53.7 kg)  03/15/22 125 lb 8 oz (56.9 kg)  09/16/21 121 lb 6.4 oz (55.1 kg)  Vital signs reviewed  05/22/2024  - Note at rest 02 sats  ***% on ***   General appearance:    ***   Min barre***     Assessment       "

## 2024-05-22 NOTE — Telephone Encounter (Signed)
 Yes, she can switch back to me

## 2024-05-22 NOTE — Telephone Encounter (Signed)
 Patient asking will you take her back on as a patient needs to stay closer to [facility]

## 2024-05-24 ENCOUNTER — Ambulatory Visit (HOSPITAL_COMMUNITY)
Admission: RE | Admit: 2024-05-24 | Discharge: 2024-05-24 | Disposition: A | Discharge status: Home / Self Care | Source: Ambulatory Visit | Attending: Internal Medicine | Admitting: Internal Medicine

## 2024-05-24 ENCOUNTER — Ambulatory Visit: Admitting: Internal Medicine

## 2024-05-24 ENCOUNTER — Encounter: Payer: Self-pay | Admitting: Internal Medicine

## 2024-05-24 ENCOUNTER — Telehealth: Payer: Self-pay | Admitting: Internal Medicine

## 2024-05-24 ENCOUNTER — Ambulatory Visit: Payer: Self-pay

## 2024-05-24 VITALS — BP 147/67 | HR 85 | Ht 62.0 in | Wt 114.0 lb

## 2024-05-24 DIAGNOSIS — J449 Chronic obstructive pulmonary disease, unspecified: Secondary | ICD-10-CM | POA: Diagnosis present

## 2024-05-24 DIAGNOSIS — Z87891 Personal history of nicotine dependence: Secondary | ICD-10-CM | POA: Diagnosis not present

## 2024-05-24 DIAGNOSIS — R918 Other nonspecific abnormal finding of lung field: Secondary | ICD-10-CM

## 2024-05-24 DIAGNOSIS — R053 Chronic cough: Secondary | ICD-10-CM

## 2024-05-24 DIAGNOSIS — R058 Other specified cough: Secondary | ICD-10-CM | POA: Insufficient documentation

## 2024-05-24 MED ORDER — ESOMEPRAZOLE MAGNESIUM 40 MG PO CPDR: DELAYED_RELEASE_CAPSULE | ORAL | 1 refills | Status: DC

## 2024-05-24 MED ORDER — TRAMADOL HCL 50 MG PO TABS: 50.0000 mg | ORAL_TABLET | ORAL | 0 refills | Status: DC | PRN

## 2024-05-24 MED ORDER — AZITHROMYCIN 250 MG PO TABS: ORAL_TABLET | ORAL | 0 refills | Status: DC

## 2024-05-24 MED ORDER — ALBUTEROL SULFATE HFA 108 (90 BASE) MCG/ACT IN AERS: 2.0000 | INHALATION_SPRAY | RESPIRATORY_TRACT | 1 refills | Status: AC | PRN

## 2024-05-24 MED ORDER — TRAMADOL HCL 50 MG PO TABS: 50.0000 mg | ORAL_TABLET | Freq: Four times a day (QID) | ORAL | 0 refills | Status: DC | PRN

## 2024-05-24 MED ORDER — PREDNISONE 10 MG PO TABS: ORAL_TABLET | ORAL | 0 refills | Status: DC

## 2024-05-24 NOTE — Telephone Encounter (Signed)
 Copied from CRM [id number]. Topic: Clinical - Prescription Issue >> May 24, 2024 11:17 AM Devaughn RAMAN wrote: Reason for CRM: Alan with [facility] pharmacy calling in regards to prescription for the pt. Alan stated the traMADol (ULTRAM) 50 MG tablet getting directions changes from every 4hrs to every 6 hours, it says for cough and for pain, tramadol is not the best opiod for cough, maybe instead of tramadol do Hydrocodone, it would help more with the cough suppression and get the MME down lower. Alan stated you can speak with Alan or Roxie or any other pharmacist regarding the medication for the pt.  [facility] [phone] >> May 24, 2024  1:41 PM Rozanna MATSU wrote: Pt is calling about her prescription that was sent for traMADol (ULTRAM) 50 MG attempted several times to call [facility] no answer calling Nurse Triage for assistance.

## 2024-05-24 NOTE — Telephone Encounter (Signed)
 Called that pharmacy would not fill tramadol script and recommended decreasing frequency. New script sent in to pharmacy.  Dorn Chill, MD [facility] Office: [phone]   See Amion for personal pager PCCM on call pager [phone] until 7pm. Please call Elink 7p-7a. [phone]

## 2024-05-24 NOTE — Progress Notes (Signed)
 "  Christy Bennett, female    DOB: [birth year]   MRN: [id number]   Brief patient profile:  68  yowf quit smoking 2016  self referred to pulmonary clinic 07/28/2022  with doe/fatigue post covid   06/2022    Dx with lung ca 2009 RT to Right  and chemo in [place] with scarring RUL on last cxrs from 2022    History of Present Illness  07/28/2022  Pulmonary/ 1st office eval/Christy Bennett  Chief Complaint  Patient presents with   Consult    Self referral, sob with exertion, decreased energy, had Covid 1 mth. ago  Baseline= yardwork / [place] and forth to [place] of [place] stop twice walking up to a mile in 30 min slt inclines  then covid Jun 24 2022 fatigue, cough, st, green mucus from nose and after a few weeks main problem fatigue   Dyspnea:  back doing yardwork / hasn't tried [place]/  short of breath but not stopping at top of steps   Cough: some cough at hs min white no green/ some am mucus p few min Sleep: level bed with pillows. SABA use: on neb brovana / yupelri   but some better p pred from [facility] Rec Try yupelri  and brovana  1st thing in am and about 12 hours later use the brovana   Also  Ok to try albuterol  15 min before an activity (on alternating days)  that you know would usually make you short of breath    If not satisfied try Prednisone  10 mg 2 daily x 5 days, 1 daily x 5 days and stop   Please schedule a follow up office visit in 6 weeks, call sooner if needed in  [place] - bring inhaler    09/08/2022  f/u ov/Christy Bennett re: doe/ fatigue post covid 06/2022  maint on lama/laba bid neb   Chief Complaint  Patient presents with   Follow-up    Pt f/u states that her breathing has been good recently  Dyspnea:  yard work is fine  Cough: minimal  Sleeping: level bed / 2 pillows SABA use: rarely  - doe not help prior to ex  02: none  Rec No change in medications except ok to leave off the evening nebulizer if doing great      03/13/2023  f/u ov/[place] office/Christy Bennett  re: GOLD 2 copd doe/ fatigue post covid  2024  maint on brovana  10 am  and yupelri  q pm   Chief Complaint  Patient presents with   Pharyngoesophageal dysphagia  Dyspnea:  limited by feet, steps sometimes but doesn't have to stop at top  Cough: clear mucus/rattling esp in am / mucinex helps  Sleeping: bed is flat two pillows/ s  resp cc  SABA use: none 02: none Rec Nexium  should be Take 30- 60 min before your first and last meals of the day  GERD diet reviewed, bed blocks rec  For cough/ congestion >   Mucinex dm  up to maximum of  1200 mg every 12 hours as needed  Brovana  1st thing in am and chase it with yupelri  and the pm brovana  dose 12 hours later  is ok to leave off if doing great.  Please schedule a follow up visit in 12  months but call sooner if needed    CT chest per Dr MARLA  08/22/23 1. Stable exam. No new or progressive findings. 2. 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 3. Multiple bilateral  pulmonary nodules are not substantially changed in the interval. Continued follow-up warranted. 4. Similar sclerotic changes in the right upper ribs and right scapula presumably treatment related. 5. Bilateral nonobstructing nephrolithiasis    05/24/2024 yearly  f/u ov/[place] office/Christy Bennett re: GOLD 2 copd doe/ fatigue post covid  2024 maint on both yupelri  / brovana  q pm  and nexium  20 mg bid though not ac with severe cough that recurred  p upper gi infection with n and V  2weeks prior to OV  and since then cough inducing vomiting daily Chief Complaint  Patient presents with   COPD    Coughing for 2 weeks causing vomit  Shob   Dyspnea:  ok unless coughing  Cough: dry hack to point of gaging and vomiting  s nausea and only vomits p coughing  Sleeping: flat bed 2 pillows takes cough drop and sleeps fine SABA use: rarely  02: none     No obvious day to day or daytime variability or assoc excess/ purulent sputum or mucus plugs or hemoptysis or cp  or chest tightness, subjective wheeze or overt  hb symptoms.    Also denies any obvious fluctuation of symptoms with weather or environmental changes or other aggravating or alleviating factors except as outlined above   No unusual exposure hx or h/o childhood pna/ asthma or knowledge of premature birth.  Current Allergies, Complete Past Medical History, Past Surgical History, Family History, and Social History were reviewed in [employer] record.  ROS  The following are not active complaints unless bolded Hoarseness, sore throat, dysphagia, dental problems, itching, sneezing,  nasal congestion or discharge of excess mucus or purulent secretions, ear ache,   fever, chills, sweats, unintended wt loss or wt gain, classically pleuritic or exertional cp,  orthopnea pnd or arm/hand swelling  or leg swelling, presyncope, palpitations, abdominal pain, anorexia, nausea, vomiting, diarrhea  or change in bowel habits or change in bladder habits, change in stools or change in urine, dysuria, hematuria,  rash, arthralgias, visual complaints, headache, numbness, weakness or ataxia or problems with walking or coordination,  change in mood or  memory.         Outpatient Medications Prior to Visit  Medication Sig Dispense Refill   albuterol  (PROAIR  HFA) 108 (90 Base) MCG/ACT inhaler Inhale 2 puffs into the lungs every 4 (four) hours as needed for shortness of breath or wheezing. 18 g 1   arformoterol  (BROVANA ) 15 MCG/2ML NEBU Take 2 mLs (15 mcg total) by nebulization in the morning and at bedtime. 60 mL 4   ascorbic acid (VITAMIN C) 500 MG tablet Take 500 mg by mouth daily.     aspirin EC 81 MG tablet Take 81 mg by mouth daily.     b complex vitamins capsule Take 1 capsule by mouth daily.     buPROPion  (WELLBUTRIN  SR) 150 MG 12 hr tablet Take 1 tablet by mouth twice daily 180 tablet 0   Calcium Carb-Cholecalciferol (CALCIUM 600/VITAMIN D  PO) Take 1 tablet by mouth. 1200 one daily      cetirizine (ZYRTEC) 10 MG tablet Take 10 mg by mouth daily.     clopidogrel  (PLAVIX ) 75 MG tablet Take 1 tablet by mouth once daily 90 tablet 0   denosumab (PROLIA) 60 MG/ML SOSY injection Inject 60 mg into the skin every 6 (six) months.     diclofenac Sodium (VOLTAREN) 1 % GEL Apply 2 g topically 3 (three) times daily as needed (pain).     docusate sodium (  COLACE) 50 MG capsule Take 50 mg by mouth as needed for mild constipation.     esomeprazole  (NEXIUM ) 20 MG capsule TAKE 1 CAPSULE BY MOUTH 2 TIMES A DAY 180 capsule 0   ferrous sulfate 325 (65 FE) MG tablet Take 325 mg by mouth every other day.     hydrALAZINE  (APRESOLINE ) 10 MG tablet Take 1 tablet (10 mg total) by mouth 3 (three) times daily. 90 tablet 1   hydroxychloroquine (PLAQUENIL) 200 MG tablet Take 300 mg by mouth daily.     leflunomide (ARAVA) 20 MG tablet Take 20 mg by mouth daily.     levothyroxine  (SYNTHROID ) 125 MCG tablet Take 1 tablet (125 mcg total) by mouth daily. 90 tablet 2   losartan  (COZAAR ) 50 MG tablet Take 1 tablet (50 mg total) by mouth daily. 30 tablet 1   meclizine (ANTIVERT) 25 MG tablet Take 25 mg by mouth 3 (three) times daily as needed for dizziness.     oxyCODONE -acetaminophen  (PERCOCET) 5-325 MG tablet Take 1 tablet by mouth every 6 (six) hours as needed for moderate pain (pain score 4-6) or severe pain (pain score 7-10). 10 tablet 0   revefenacin  (YUPELRI ) 175 MCG/3ML nebulizer solution Take 3 mLs (175 mcg total) by nebulization daily. 90 mL 1   simvastatin  (ZOCOR ) 10 MG tablet Take 1 tablet (10 mg total) by mouth daily. 90 tablet 1   sulfaSALAzine (AZULFIDINE) 500 MG tablet Take 500-1,000 mg by mouth See admin instructions. Taking 500 mg by mouth in the morning and 1000 mg in the evening     traZODone  (DESYREL ) 150 MG tablet Take 1 tablet (150 mg total) by mouth at bedtime. 90 tablet 0   valACYclovir  (VALTREX ) 500 MG tablet Take 4 tablets (2,000 mg total) by mouth 2 (two) times daily as needed (fever  blisters). 20 tablet 1   verapamil  (VERELAN ) 240 MG 24 hr capsule Take 1 capsule (240 mg total) by mouth daily. 90 capsule 1   Vitamin E 268 MG (400 UNIT) TABS Take 400 Units by mouth daily.     anastrozole  (ARIMIDEX ) 1 MG tablet Take 1 tablet (1 mg total) by mouth daily. (Patient not taking: Reported on 05/24/2024) 90 tablet 3   HYDROcodone-acetaminophen  (NORCO/VICODIN) 5-325 MG tablet Take 1 tablet by mouth. (Patient not taking: Reported on 05/24/2024)     methocarbamol (ROBAXIN) 500 MG tablet Take 500 mg by mouth 4 (four) times daily as needed. (Patient not taking: Reported on 05/24/2024)     No facility-administered medications prior to visit.           Past Medical History:  Diagnosis Date   Allergy    COPD (chronic obstructive pulmonary disease) (HCC)    Depression    GERD (gastroesophageal reflux disease)    Hyperlipemia    Hypertension    Hypothyroid    Lung cancer (HCC) 2009   Osteoporosis    PVD (peripheral vascular disease) (HCC)    RA (rheumatoid arthritis) (HCC)       Objective:    Wts  05/24/2024         114  03/13/2023     119  09/08/2022       121   07/28/22 118 lb 6.4 oz (53.7 kg)  03/15/22 125 lb 8 oz (56.9 kg)  09/16/21 121 lb 6.4 oz (55.1 kg)   Vital signs reviewed  05/24/2024  - Note at rest 02 sats  90% on RA   General appearance:    amb talkative wf/  mostly upper airway wheze   HEENT : Oropharynx  clear   Nasal turbinates nl    NECK :  without  apparent JVD/ palpable Nodes/TM    LUNGS: no acc muscle use,  Min barrel  contour chest wall with bilateral  slightly decreased bs s audible wheeze and  without cough on insp or exp maneuvers and min  Hyperresonant  to  percussion bilaterally    CV:  RRR  no s3 or murmur or increase in P2, and no edema   ABD:  soft and nontender    MS:  Nl gait/ ext warm without deformities Or obvious joint restrictions  calf tenderness, cyanosis or clubbing     SKIN: warm and dry without lesions    NEURO:  alert,  approp, nl sensorium with  no motor or cerebellar deficits apparent.          CXR PA and Lateral:   05/24/2024 :    I personally reviewed images and impression is as follows:     Possible pna R mid lateral chest but not seen well on Lateral cxr  Assessment   Assessment & Plan COPD mixed type (HCC) Quit smoking 2016/ GOLD 2 COPD  - PFT's  08/05/22  FEV1 1.38 (62 % ) ratio 0.63  p 8 % improvement from saba p ? prior to study with DLCO  13.6 (67%)   and FV curve min curved  and ERV 37%  at wt 138  - 07/28/2022   Walked on RA  x  3  lap(s) =  approx 750  ft  @ avg pace, stopped due to end of study with lowest 02 sats 96% s sob    Pt is Group B in terms of symptom/risk and laba/lama therefore appropriate rx at this point >>>  continue brovana /yupelri  but change to q am with brovana  q pm     Upper airway cough syndrome Recurrent since covid  06/2022  - cyclical cough rx 05/24/2024 with tramadol, nexium  40 mg  bid ac / pred x 6 days  comments Of the three most common causes of  Sub-acute / recurrent or chronic cough, only one (GERD)  can actually contribute to/ trigger  the other two (asthma and post nasal drip syndrome)  and perpetuate the cylce of cough.  While not intuitively obvious, many patients with chronic low grade reflux do not cough until there is a primary insult that disturbs the protective epithelial barrier and exposes sensitive nerve endings.   This is typically viral but can due to PNDS and  either may apply here.     The point is that once this occurs, it is difficult to eliminate the cycle  using anything but a maximally effective acid suppression regimen at least in the short run, accompanied by an appropriate diet to address non acid GERD and control / eliminate the cough itself for at least with:  >>> 3 -5 days with tramadol 50 mg up to every 4 hours   >>>  also added 6 day taper off  Prednisone  starting at 40 mg per day in case of component of Th-2 driven upper or lower airways  inflammation (if cough responds short term only to relapse before return while will on full rx for uacs (as above), then that would point to allergic rhinitis/ asthma or eos bronchitis as alternative dx)    Pulmonary infiltrates on CXR Onset of symptoms mid December 2025 assoc with vomiting// cough to vomit - cxr with R  mid/lateral AS dz not seen well on lateral view - assoc the cough to vomit ? Pertusss, ? Asp > no ongoing fever or purulent sputum so try zpak and fu with cxr in 2 weeks, sooner if needed         Each maintenance medication was reviewed in detail including emphasizing most importantly the difference between maintenance and prns and under what circumstances the prns are to be triggered using an action plan format where appropriate.  Total time for H and P, chart review, counseling, reviewing neb device(s) and generating customized AVS unique to this office visit / same day charting = 45 min  for multiple chronic/ recurrent  refractory respiratory  symptoms of uncertain etiology          AVS  Patient Instructions  Nexium  40 mg should be Take 30- 60 min before your first and last meals of the day   GERD (REFLUX)  is an extremely common cause of respiratory symptoms just like yours , many times with no obvious heartburn at all.    It can be treated with medication, but also with lifestyle changes including elevation of the head of your bed (ideally with 6 -8inch blocks under the headboard of your bed),  Smoking cessation, avoidance of late meals, excessive alcohol, and avoid fatty foods, chocolate, peppermint, colas, red wine, and acidic juices such as orange juice.  NO MINT OR MENTHOL PRODUCTS SO NO COUGH DROPS  USE SUGARLESS CANDY INSTEAD (Jolley ranchers or Stover's or Life Savers) or even ice chips will also do - the key is to swallow to prevent all throat clearing. NO OIL BASED VITAMINS - use powdered substitutes.  Avoid fish oil when coughing.   For cough/ congestion >    Mucinex dm  up to maximum of  1200 mg every 12 hours and supplement with tramadol 50 mg  up 1 every 4 hours as needed   Prednisone  10 mg take  4 each am x 2 days,   2 each am x 2 days,  1 each am x 2 days and stop   Brovana  (aformoterol) 1st thing in am and chase it with yupelri  and the   brovana  dose 12 hours later    Please remember to go to the  x-ray department  @  [facility] for your tests - we will call you with the results when they are available     Please schedule a follow up office visit in 2 weeks, sooner if needed  with all medications /inhalers/ solutions in hand so we can verify exactly what you are taking. This includes all medications from all doctors and over the counters   Add:  zpak and fu with cxr in 2 weeks, sooner if needed        Ozell America, MD 05/24/2024      "

## 2024-05-24 NOTE — Telephone Encounter (Signed)
 FYI Only or Action Required?: FYI only for provider: Contacted OCP.  Patient is followed in Pulmonology for COPD, last seen on 05/24/2024 by Darlean Ozell NOVAK, MD.  Called Nurse Triage reporting Medication Problem.  Triage Disposition: Call PCP Now  Patient/caregiver understands and will follow disposition?: Yes  Communication Reason for CRM: Alan with [facility] calling in regards to prescription for the pt. Alan stated the traMADol (ULTRAM) 50 MG tablet getting directions changes from every 4hrs to every 6 hours, it says for cough and for pain, tramadol is not the best opiod for cough, maybe instead of tramadol do Hydrocodone, it would help more with the cough suppression and get the MME down lower. Alan stated you can speak with Alan or Roxie or any other pharmacist regarding the medication for the pt.    [facility] [phone]   Reason for Disposition  [1] Caller has URGENT medicine question about med that primary care doctor (or NP/PA) or specialist prescribed AND [2] triager unable to answer question  Answer Assessment - Initial Assessment Questions 1. NAME of MEDICINE: What medicine(s) are you calling about?     Tramadol  2. QUESTION: What is your question? (e.g., double dose of medicine, side effect)     Pt unable to pick up medication from pharmacy. Contacted OCP, Dr. Kara, to assist with prescribing medication for patient due to inability for [facility] to fill medication as it was written.   Pt agrees with plan of care, will call back for any worsening symptoms  Protocols used: Medication Question Call-A-AH

## 2024-05-24 NOTE — Assessment & Plan Note (Addendum)
 Recurrent since covid  06/2022  - cyclical cough rx 05/24/2024 with tramadol, nexium  40 mg  bid ac / pred x 6 days  comments Of the three most common causes of  Sub-acute / recurrent or chronic cough, only one (GERD)  can actually contribute to/ trigger  the other two (asthma and post nasal drip syndrome)  and perpetuate the cylce of cough.  While not intuitively obvious, many patients with chronic low grade reflux do not cough until there is a primary insult that disturbs the protective epithelial barrier and exposes sensitive nerve endings.   This is typically viral but can due to PNDS and  either may apply here.     The point is that once this occurs, it is difficult to eliminate the cycle  using anything but a maximally effective acid suppression regimen at least in the short run, accompanied by an appropriate diet to address non acid GERD and control / eliminate the cough itself for at least with:  >>> 3 -5 days with tramadol 50 mg up to every 4 hours   >>>  also added 6 day taper off  Prednisone  starting at 40 mg per day in case of component of Th-2 driven upper or lower airways inflammation (if cough responds short term only to relapse before return while will on full rx for uacs (as above), then that would point to allergic rhinitis/ asthma or eos bronchitis as alternative dx)

## 2024-05-24 NOTE — Telephone Encounter (Signed)
 Cxr suggests either apiration or unrelated PNA so rx  zpak and fu with cxr in 2 weeks, call sooner if needed

## 2024-05-24 NOTE — Telephone Encounter (Signed)
"  scheduled  "

## 2024-05-24 NOTE — Assessment & Plan Note (Addendum)
 Onset of symptoms mid December 2025 assoc with vomiting// cough to vomit - cxr with R mid/lateral AS dz not seen well on lateral view - assoc the cough to vomit ? Pertusss, ? Asp > no ongoing fever or purulent sputum so try zpak and fu with cxr in 2 weeks, sooner if needed         Each maintenance medication was reviewed in detail including emphasizing most importantly the difference between maintenance and prns and under what circumstances the prns are to be triggered using an action plan format where appropriate.  Total time for H and P, chart review, counseling, reviewing neb device(s) and generating customized AVS unique to this office visit / same day charting = 45 min  for multiple chronic/ recurrent  refractory respiratory  symptoms of uncertain etiology

## 2024-05-24 NOTE — Patient Instructions (Addendum)
 Nexium  40 mg should be Take 30- 60 min before your first and last meals of the day   GERD (REFLUX)  is an extremely common cause of respiratory symptoms just like yours , many times with no obvious heartburn at all.    It can be treated with medication, but also with lifestyle changes including elevation of the head of your bed (ideally with 6 -8inch blocks under the headboard of your bed),  Smoking cessation, avoidance of late meals, excessive alcohol, and avoid fatty foods, chocolate, peppermint, colas, red wine, and acidic juices such as orange juice.  NO MINT OR MENTHOL PRODUCTS SO NO COUGH DROPS  USE SUGARLESS CANDY INSTEAD (Jolley ranchers or Stover's or Life Savers) or even ice chips will also do - the key is to swallow to prevent all throat clearing. NO OIL BASED VITAMINS - use powdered substitutes.  Avoid fish oil when coughing.   For cough/ congestion >   Mucinex dm  up to maximum of  1200 mg every 12 hours and supplement with tramadol 50 mg  up 1 every 4 hours as needed   Prednisone  10 mg take  4 each am x 2 days,   2 each am x 2 days,  1 each am x 2 days and stop   Brovana  (aformoterol) 1st thing in am and chase it with yupelri  and the   brovana  dose 12 hours later    Please remember to go to the  x-ray department  @  [facility] for your tests - we will call you with the results when they are available     Please schedule a follow up office visit in 2 weeks, sooner if needed  with all medications /inhalers/ solutions in hand so we can verify exactly what you are taking. This includes all medications from all doctors and over the counters   Add:  zpak and fu with cxr in 2 weeks, sooner if needed

## 2024-05-24 NOTE — Assessment & Plan Note (Addendum)
 Quit smoking 2016/ GOLD 2 COPD  - PFT's  08/05/22  FEV1 1.38 (62 % ) ratio 0.63  p 8 % improvement from saba p ? prior to study with DLCO  13.6 (67%)   and FV curve min curved  and ERV 37%  at wt 138  - 07/28/2022   Walked on RA  x  3  lap(s) =  approx 750  ft  @ avg pace, stopped due to end of study with lowest 02 sats 96% s sob    Pt is Group B in terms of symptom/risk and laba/lama therefore appropriate rx at this point >>>  continue brovana /yupelri  but change to q am with brovana  q pm

## 2024-05-27 NOTE — Telephone Encounter (Signed)
 Spoke with the pt and notified of results/recs per Dr. Darlean  She verbalized understanding  She states that she had to go to ED after having a fall at home 05/26/24 They did cxr and added cefdinir to the zpack she is already on  They advised her that she needed to f/u here end of this wk  I have scheduled her appt and notified of [address] location  Nothing further needed

## 2024-05-29 ENCOUNTER — Ambulatory Visit: Admitting: Family Medicine

## 2024-05-31 ENCOUNTER — Encounter: Payer: Self-pay | Admitting: Internal Medicine

## 2024-05-31 ENCOUNTER — Ambulatory Visit: Admitting: Internal Medicine

## 2024-05-31 ENCOUNTER — Ambulatory Visit

## 2024-05-31 VITALS — BP 134/74 | HR 85 | Temp 98.1°F | Ht 62.0 in | Wt 110.0 lb

## 2024-05-31 DIAGNOSIS — J449 Chronic obstructive pulmonary disease, unspecified: Secondary | ICD-10-CM

## 2024-05-31 DIAGNOSIS — J189 Pneumonia, unspecified organism: Secondary | ICD-10-CM | POA: Insufficient documentation

## 2024-05-31 DIAGNOSIS — Z87891 Personal history of nicotine dependence: Secondary | ICD-10-CM

## 2024-05-31 MED ORDER — ALBUTEROL SULFATE (2.5 MG/3ML) 0.083% IN NEBU: 2.5000 mg | INHALATION_SOLUTION | RESPIRATORY_TRACT | 12 refills | Status: AC | PRN

## 2024-05-31 NOTE — Assessment & Plan Note (Addendum)
 Quit smoking 2016 - PFT's  08/05/22  FEV1 1.38 (62 % ) ratio 0.63  p 8 % improvement from saba p ? prior to study with DLCO  13.6 (67%)   and FV curve min curved  and ERV 37%  at wt 138  - 07/28/2022   Walked on RA  x  3  lap(s) =  approx 750  ft  @ avg pace, stopped due to end of study with lowest 02 sats 96% s sob   - . 05/31/2024  After extensive coaching inhaler device,  effectiveness =    50% hfa   >>> continue brovan/ yupelri  and prn saba per ABC actiion plan          Each maintenance medication was reviewed in detail including emphasizing most importantly the difference between maintenance and prns and under what circumstances the prns are to be triggered using an action plan format where appropriate.  Total time for H and P, chart review, counseling, reviewing neb/ hfa device(s) and generating customized AVS unique to this office visit / same day charting = 34 min

## 2024-05-31 NOTE — Progress Notes (Signed)
 "  Christy Bennett, female    DOB: September 09, 1955   MRN: [id number]   Brief patient profile:  68  yowf quit smoking 2016  self referred to pulmonary clinic 07/28/2022  with doe/fatigue post covid   06/2022    Dx with lung ca 2009 RT to Right  and chemo in [place] with scarring RUL on last cxrs from 2022    History of Present Illness  07/28/2022  Pulmonary/ 1st office eval/Pihu Basil  Chief Complaint  Patient presents with   Consult    Self referral, sob with exertion, decreased energy, had Covid 1 mth. ago  Baseline= yardwork / back and forth to [place] of [place] stop twice walking up to a mile in 30 min slt inclines  then covid Jun 24 2022 fatigue, cough, st, green mucus from nose and after a few weeks main problem fatigue   Dyspnea:  back doing yardwork / hasn't tried [place]/  short of breath but not stopping at top of steps   Cough: some cough at hs min white no green/ some am mucus p few min Sleep: level bed with pillows. SABA use: on neb brovana / yupelri   but some better p pred from [facility] Rec Try yupelri  and brovana  1st thing in am and about 12 hours later use the brovana   Also  Ok to try albuterol  15 min before an activity (on alternating days)  that you know would usually make you short of breath    If not satisfied try Prednisone  10 mg 2 daily x 5 days, 1 daily x 5 days and stop   Please schedule a follow up office visit in 6 weeks, call sooner if needed in  [place] - bring inhaler    09/08/2022  f/u ov/[place] office/Taniqua Issa re: doe/ fatigue post covid 06/2022  maint on lama/laba bid neb   Chief Complaint  Patient presents with   Follow-up    Pt f/u states that her breathing has been good recently  Dyspnea:  yard work is fine  Cough: minimal  Sleeping: level bed / 2 pillows SABA use: rarely  - doe not help prior to ex  02: none  Rec No change in medications except ok to leave off the evening nebulizer if doing great      03/13/2023  f/u ov/[place] office/Mahmud Keithly  re: GOLD 2 copd doe/ fatigue post covid  2024  maint on brovana  10 am  and yupelri  q pm   Chief Complaint  Patient presents with   Pharyngoesophageal dysphagia  Dyspnea:  limited by feet, steps sometimes but doesn't have to stop at top  Cough: clear mucus/rattling esp in am / mucinex helps  Sleeping: bed is flat two pillows/ s  resp cc  SABA use: none 02: none Rec Nexium  should be Take 30- 60 min before your first and last meals of the day  GERD diet reviewed, bed blocks rec  For cough/ congestion >   Mucinex dm  up to maximum of  1200 mg every 12 hours as needed  Brovana  1st thing in am and chase it with yupelri  and the pm brovana  dose 12 hours later  is ok to leave off if doing great.  Please schedule a follow up visit in 12  months but call sooner if needed    CT chest per Dr MARLA  08/22/23 1. Stable exam. No new or progressive findings. 2. 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 3. Multiple bilateral  pulmonary nodules are not substantially changed in the interval. Continued follow-up warranted. 4. Similar sclerotic changes in the right upper ribs and right scapula presumably treatment related. 5. Bilateral nonobstructing nephrolithiasis    05/24/2024 yearly  f/u ov/[place] office/Orrie Schubert re: GOLD 2 copd doe/ fatigue post covid  2024 maint on both yupelri  / brovana  q pm  and nexium  20 mg bid though not ac with severe cough that recurred  p upper gi infection with n and V  2weeks prior to OV  and since then cough inducing vomiting daily Chief Complaint  Patient presents with   COPD    Coughing for 2 weeks causing vomit  Shob   Dyspnea:  ok unless coughing  Cough: dry hack to point of gaging and vomiting  s nausea and only vomits p coughing  Sleeping: flat bed 2 pillows takes cough drop and sleeps fine SABA use: rarely  02: none  Patient Instructions  Nexium  40 mg should be Take 30- 60 min before your first and last meals of the day  GERD diet  reviewed, bed blocks rec  For cough/ congestion >   Mucinex dm  up to maximum of  1200 mg every 12 hours and supplement with tramadol  50 mg  up 1 every 4 hours as needed  Prednisone  10 mg take  4 each am x 2 days,   2 each am x 2 days,  1 each am x 2 days and stop  Brovana  (aformoterol) 1st thing in am and chase it with yupelri  and the   brovana  dose 12 hours later    Please remember to go to the  x-ray department  @  [facility] for your tests - we will call you with the results when they are available    Please schedule a follow up office visit in 2 weeks, sooner if needed  with all medications /inhalers/ solutions in hand   Add:  zpak and fu with cxr in 2 weeks, sooner if needed     05/31/2024  f/u ov/Renisha Cockrum re:  CAP/ GOLD 2 COPD  vmaint on brovana  / yupelri   omnicef/zpak Chief Complaint  Patient presents with   COPD    Patient states her breathing is better.   Dyspnea:  better as  cough improved  Cough: dark beige no blood much less volume/ no cp or fever  Sleeping: bed is flat, 2 pillows baseline s resp cc  SABA use: albuterol  not using  02: none       No obvious day to day or daytime variability or assoc excess/ purulent sputum or mucus plugs or hemoptysis or cp or chest tightness, subjective wheeze or overt sinus or hb symptoms.    Also denies any obvious fluctuation of symptoms with weather or environmental changes or other aggravating or alleviating factors except as outlined above   No unusual exposure hx or h/o childhood pna/ asthma or knowledge of premature birth.  Current Allergies, Complete Past Medical History, Past Surgical History, Family History, and Social History were reviewed in [employer] record.  ROS  The following are not active complaints unless bolded Hoarseness, sore throat, dysphagia, dental problems, itching, sneezing,  nasal congestion or discharge of excess mucus or purulent secretions, ear ache,   fever, chills, sweats,  unintended wt loss or wt gain, classically pleuritic or exertional cp,  orthopnea pnd or arm/hand swelling  or leg swelling, presyncope, palpitations, abdominal pain, anorexia, nausea, vomiting, diarrhea  or change in bowel habits or  change in bladder habits, change in stools or change in urine, dysuria, hematuria,  rash, arthralgias, visual complaints, headache, numbness, weakness or ataxia or problems with walking or coordination,  change in mood or  memory.          Outpatient Medications Prior to Visit  Medication Sig Dispense Refill   albuterol  (PROAIR  HFA) 108 (90 Base) MCG/ACT inhaler Inhale 2 puffs into the lungs every 4 (four) hours as needed for shortness of breath or wheezing. 18 g 1   arformoterol  (BROVANA ) 15 MCG/2ML NEBU Take 2 mLs (15 mcg total) by nebulization in the morning and at bedtime. 60 mL 4   ascorbic acid (VITAMIN C) 500 MG tablet Take 500 mg by mouth daily.     aspirin EC 81 MG tablet Take 81 mg by mouth daily.     azithromycin  (ZITHROMAX ) 250 MG tablet Take 2 on day one then 1 daily x 4 days 6 tablet 0   b complex vitamins capsule Take 1 capsule by mouth daily.     buPROPion  (WELLBUTRIN  SR) 150 MG 12 hr tablet Take 1 tablet by mouth twice daily 180 tablet 0   Calcium Carb-Cholecalciferol (CALCIUM 600/VITAMIN D  PO) Take 1 tablet by mouth. 1200 one daily     cetirizine (ZYRTEC) 10 MG tablet Take 10 mg by mouth daily.     clopidogrel  (PLAVIX ) 75 MG tablet Take 1 tablet by mouth once daily 90 tablet 0   denosumab (PROLIA) 60 MG/ML SOSY injection Inject 60 mg into the skin every 6 (six) months.     diclofenac Sodium (VOLTAREN) 1 % GEL Apply 2 g topically 3 (three) times daily as needed (pain).     docusate sodium (COLACE) 50 MG capsule Take 50 mg by mouth as needed for mild constipation.     esomeprazole  (NEXIUM ) 40 MG capsule Take 30- 60 min before your first and last meals of the day 60 capsule 1   ferrous sulfate 325 (65 FE) MG tablet Take 325 mg by mouth every other  day.     hydrALAZINE  (APRESOLINE ) 10 MG tablet Take 1 tablet (10 mg total) by mouth 3 (three) times daily. 90 tablet 1   hydroxychloroquine (PLAQUENIL) 200 MG tablet Take 300 mg by mouth daily.     leflunomide (ARAVA) 20 MG tablet Take 20 mg by mouth daily.     levothyroxine  (SYNTHROID ) 125 MCG tablet Take 1 tablet (125 mcg total) by mouth daily. 90 tablet 2   losartan  (COZAAR ) 50 MG tablet Take 1 tablet (50 mg total) by mouth daily. 30 tablet 1   meclizine (ANTIVERT) 25 MG tablet Take 25 mg by mouth 3 (three) times daily as needed for dizziness.     oxyCODONE -acetaminophen  (PERCOCET) 5-325 MG tablet Take 1 tablet by mouth every 6 (six) hours as needed for moderate pain (pain score 4-6) or severe pain (pain score 7-10). 10 tablet 0   predniSONE  (DELTASONE ) 10 MG tablet Take  4 each am x 2 days,   2 each am x 2 days,  1 each am x 2 days and stop 14 tablet 0   revefenacin  (YUPELRI ) 175 MCG/3ML nebulizer solution Take 3 mLs (175 mcg total) by nebulization daily. 90 mL 1   simvastatin  (ZOCOR ) 10 MG tablet Take 1 tablet (10 mg total) by mouth daily. 90 tablet 1   sulfaSALAzine (AZULFIDINE) 500 MG tablet Take 500-1,000 mg by mouth See admin instructions. Taking 500 mg by mouth in the morning and 1000 mg in the evening  traMADol  (ULTRAM ) 50 MG tablet Take 1 tablet (50 mg total) by mouth every 6 (six) hours as needed. 40 tablet 0   traZODone  (DESYREL ) 150 MG tablet Take 1 tablet (150 mg total) by mouth at bedtime. 90 tablet 0   valACYclovir  (VALTREX ) 500 MG tablet Take 4 tablets (2,000 mg total) by mouth 2 (two) times daily as needed (fever blisters). 20 tablet 1   verapamil  (VERELAN ) 240 MG 24 hr capsule Take 1 capsule (240 mg total) by mouth daily. 90 capsule 1   Vitamin E 268 MG (400 UNIT) TABS Take 400 Units by mouth daily.     No facility-administered medications prior to visit.             Past Medical History:  Diagnosis Date   Allergy    COPD (chronic obstructive pulmonary disease)  (HCC)    Depression    GERD (gastroesophageal reflux disease)    Hyperlipemia    Hypertension    Hypothyroid    Lung cancer (HCC) 2009   Osteoporosis    PVD (peripheral vascular disease) (HCC)    RA (rheumatoid arthritis) (HCC)       Objective:    Wts  05/31/2024          110   05/24/2024        114  03/13/2023     119  09/08/2022       121   07/28/22 118 lb 6.4 oz (53.7 kg)  03/15/22 125 lb 8 oz (56.9 kg)  09/16/21 121 lb 6.4 oz (55.1 kg)   Vital signs reviewed  05/31/2024  - Note at rest 02 sats  94% on RA   General appearance:    amb wf nad    HEENT : Oropharynx  clear       NECK :  without  apparent JVD/ palpable Nodes/TM    LUNGS: no acc muscle use,  Min barrel  contour chest wall with bilateral scattered insp/ exp rhonchi  and  without cough on insp or exp maneuvers and min  Hyperresonant  to  percussion bilaterally    CV:  RRR  no s3 or murmur or increase in P2, and no edema   ABD:  soft and nontender    MS:  Nl gait/ ext warm without deformities Or obvious joint restrictions  calf tenderness, cyanosis or clubbing     SKIN: warm and dry without lesions    NEURO:  alert, approp, nl sensorium with  no motor or cerebellar deficits apparent.          CXR PA and Lateral:   05/31/2024 :    I personally reviewed images and impression is as follows:     Improved aeration RUL   Assessment   Assessment & Plan Community acquired pneumonia of right lung, unspecified part of lung See cxr  05/24/24 rx zpak/ omnicef > improved  05/31/2024   Responding to approp rx for CAP > finish bx and f/u in 4-6 weeks with cxr, soone prn     COPD  GOLD 2 with tendency to cyclical cough Quit smoking 2016 - PFT's  08/05/22  FEV1 1.38 (62 % ) ratio 0.63  p 8 % improvement from saba p ? prior to study with DLCO  13.6 (67%)   and FV curve min curved  and ERV 37%  at wt 138  - 07/28/2022   Walked on RA  x  3  lap(s) =  approx 750  ft  @ avg  pace, stopped due to end of study with lowest 02 sats  96% s sob   - . 05/31/2024  After extensive coaching inhaler device,  effectiveness =    50% hfa   >>> continue brovan/ yupelri  and prn saba per ABC actiion plan          Each maintenance medication was reviewed in detail including emphasizing most importantly the difference between maintenance and prns and under what circumstances the prns are to be triggered using an action plan format where appropriate.  Total time for H and P, chart review, counseling, reviewing neb/ hfa device(s) and generating customized AVS unique to this office visit / same day charting = 34 min          AVS  Patient Instructions  Plan A = Automatic = Always=    Brovan/ yupelri    Plan B = Backup (to supplement plan A, not to replace it) Use your albuterol  inhaler as a rescue medication to be used if you can't catch your breath by resting or slowing your pace  or doing a relaxed purse lip breathing pattern.  - The less you use it, the better it will work when you need it. - Ok to use the inhaler up to 2 puffs  every 4 hours if you must but call for appointment if use goes up over your usual need - Don't leave home without it !!  (think of it like the spare tire or starter fluid for your car)    Plan C = Crisis (instead of Plan B but only if Plan B stops working) - only use your albuterol  nebulizer if you first try Plan B and it fails to help > ok to use the nebulizer up to every 4 hours but if start needing it regularly call for immediate appointment   Please remember to go to the  x-ray department  for your tests - we will call you with the results when they are available      Change appt in RDS to 4-6 weeks           Ozell America, MD 05/31/2024         "

## 2024-05-31 NOTE — Assessment & Plan Note (Addendum)
 See cxr  05/24/24 rx zpak/ omnicef > improved  05/31/2024   Responding to approp rx for CAP > finish bx and f/u in 4-6 weeks with cxr, soone prn

## 2024-05-31 NOTE — Patient Instructions (Addendum)
 Plan A = Automatic = Always=    Brovan/ yupelri    Plan B = Backup (to supplement plan A, not to replace it) Use your albuterol  inhaler as a rescue medication to be used if you can't catch your breath by resting or slowing your pace  or doing a relaxed purse lip breathing pattern.  - The less you use it, the better it will work when you need it. - Ok to use the inhaler up to 2 puffs  every 4 hours if you must but call for appointment if use goes up over your usual need - Don't leave home without it !!  (think of it like the spare tire or starter fluid for your car)    Plan C = Crisis (instead of Plan B but only if Plan B stops working) - only use your albuterol  nebulizer if you first try Plan B and it fails to help > ok to use the nebulizer up to every 4 hours but if start needing it regularly call for immediate appointment   Please remember to go to the  x-ray department  for your tests - we will call you with the results when they are available      Change appt in RDS to 4-6 weeks

## 2024-06-03 ENCOUNTER — Ambulatory Visit: Payer: Self-pay | Admitting: Internal Medicine

## 2024-06-07 ENCOUNTER — Ambulatory Visit: Payer: Self-pay | Admitting: Internal Medicine

## 2024-06-10 ENCOUNTER — Other Ambulatory Visit: Payer: Self-pay

## 2024-06-10 ENCOUNTER — Ambulatory Visit: Admitting: Internal Medicine

## 2024-06-13 ENCOUNTER — Ambulatory Visit (INDEPENDENT_AMBULATORY_CARE_PROVIDER_SITE_OTHER)

## 2024-06-13 VITALS — BP 136/71 | HR 79 | Ht 62.0 in | Wt 111.0 lb

## 2024-06-13 DIAGNOSIS — I10 Essential (primary) hypertension: Secondary | ICD-10-CM

## 2024-06-13 DIAGNOSIS — E039 Hypothyroidism, unspecified: Secondary | ICD-10-CM

## 2024-06-13 DIAGNOSIS — F5104 Psychophysiologic insomnia: Secondary | ICD-10-CM | POA: Diagnosis not present

## 2024-06-13 DIAGNOSIS — E785 Hyperlipidemia, unspecified: Secondary | ICD-10-CM

## 2024-06-13 DIAGNOSIS — D649 Anemia, unspecified: Secondary | ICD-10-CM

## 2024-06-13 MED ORDER — VERAPAMIL HCL ER 240 MG PO CP24: 240.0000 mg | ORAL_CAPSULE | Freq: Every day | ORAL | 3 refills | Status: AC

## 2024-06-13 MED ORDER — SIMVASTATIN 10 MG PO TABS: 10.0000 mg | ORAL_TABLET | Freq: Every day | ORAL | 3 refills | Status: AC

## 2024-06-13 MED ORDER — HYDRALAZINE HCL 10 MG PO TABS: 10.0000 mg | ORAL_TABLET | Freq: Three times a day (TID) | ORAL | 3 refills | Status: AC

## 2024-06-13 MED ORDER — TRAZODONE HCL 150 MG PO TABS: 150.0000 mg | ORAL_TABLET | Freq: Every day | ORAL | 3 refills | Status: AC

## 2024-06-13 MED ORDER — LOSARTAN POTASSIUM 50 MG PO TABS: 50.0000 mg | ORAL_TABLET | Freq: Every day | ORAL | 3 refills | Status: AC

## 2024-06-13 NOTE — Progress Notes (Signed)
 "   Established Patient Office Visit  Subjective   Patient ID: Christy Bennett, female    DOB: 12-06-1955  Age: 69 y.o. MRN: [id number]  Chief Complaint  Patient presents with   Medical Management of Chronic Issues    Pt here for a follow up, two falls in the last month Rhemutologist wants a CAT scan and PET Scan has one schedule for march     HPI Discussed the use of AI scribe software for clinical note transcription with the patient, who gave verbal consent to proceed.  History of Present Illness    Christy Bennett is a 69 year old female with stage three lung cancer who presents with frequent falls and weight loss.  Frequent falls and gait instability - Two recent significant falls: one down ten steps, another down five or six steps at home - Describes sensation as somersaulting during falls - No fractures sustained, but extensive bruising due to anticoagulation therapy - Bedroom located upstairs; considering moving to mother-in-law's quarters to avoid stairs  Unintentional weight loss and fatigue - Weight decreased from 112 lbs to 108 lbs over a short period - Appetite decreased following flu-like illness and pneumonia, but has since returned - Persistent fatigue and easy tiring - Post-bypass surgery nausea and lack of appetite contributed to weight loss  Respiratory symptoms and pulmonary history - History of stage three lung cancer diagnosed in 2009 - Recent pneumonia in January affecting right lung - Recent emergency room visit after second fall; chest x-ray performed, prescribed additional antibiotic - Concern regarding current respiratory health  Neuropathic pain and sciatica - Recent diagnosis of sciatica with severe pain primarily in hip - Taking pregabalin for nerve pain, but not as frequently as prescribed due to side effects - Occasional use of gabapentin   Hypertension - History of hypertension, recently uncontrolled with systolic readings over 200 -  Hydralazine  prescribed in emergency room, resulting in improved blood pressure control - Continues losartan  therapy and requires refill  Postoperative status and anemia - Bypass surgery for subclavian and carotid arteries performed on November 4th - Postoperative nausea and lack of appetite - Hemoglobin decreased to 9.3  Peripheral edema - No significant leg swelling - Small amount of swelling improves with wearing socks  Thyroid dysfunction - Issues with thyroid medication, previously adjusted due to high levels - Due for recheck of thyroid levels     Patient Active Problem List   Diagnosis Date Noted   Anemia 06/20/2024   CAP (community acquired pneumonia) 05/31/2024   Upper airway cough syndrome 05/24/2024   Pulmonary infiltrates on CXR 05/24/2024   History of malignant neoplasm of lung 03/13/2023   Anxiety 03/13/2023   Bunion 03/13/2023   Depression 03/13/2023   Hypokalemia 03/13/2023   Tobacco user 03/13/2023   Encounter for well adult exam with abnormal findings 01/04/2023   GERD (gastroesophageal reflux disease) 10/03/2022   NSCLC of right lung (HCC) 10/03/2022   PAD (peripheral artery disease) 10/03/2022   Hypothyroidism 10/03/2022   Essential hypertension 10/03/2022   Hyperlipidemia 10/03/2022   Rheumatoid arthritis (HCC) 10/03/2022   Osteoporosis 10/03/2022   Insomnia 10/03/2022   Generalized anxiety disorder 10/03/2022   Dysphagia 09/08/2022   COPD  GOLD 2 with tendency to cyclical cough 07/28/2022   Callus of foot 04/29/2021   Paresthesia 10/08/2019   Breast cancer, left (HCC) 04/01/2019   Generalized osteoarthritis 05/07/2018   Arthralgia 01/02/2018   Atherosclerosis of artery of extremity with rest pain (HCC) 02/10/2017   Claudication 10/19/2016  Familial tremor 02/21/2012   Kidney stone 06/25/2010   Occlusion and stenosis of unspecified carotid artery 09/03/2008   Vitamin D  deficiency, unspecified 07/22/2008   Lung cancer (HCC) 12/23/2007     ROS    Objective:     BP 136/71   Pulse 79   Ht 5' 2 (1.575 m)   Wt 111 lb (50.3 kg)   SpO2 93%   BMI 20.30 kg/m  BP Readings from Last 3 Encounters:  06/13/24 136/71  05/31/24 134/74  05/24/24 (!) 147/67   Wt Readings from Last 3 Encounters:  06/13/24 111 lb (50.3 kg)  05/31/24 110 lb (49.9 kg)  05/24/24 114 lb (51.7 kg)     Physical Exam Vitals and nursing note reviewed.  Constitutional:      Appearance: Normal appearance.  HENT:     Head: Normocephalic.  Eyes:     Extraocular Movements: Extraocular movements intact.     Pupils: Pupils are equal, round, and reactive to light.  Cardiovascular:     Rate and Rhythm: Normal rate and regular rhythm.  Pulmonary:     Effort: Pulmonary effort is normal.     Breath sounds: Normal breath sounds.  Musculoskeletal:     Cervical back: Normal range of motion and neck supple.  Neurological:     Mental Status: She is alert and oriented to person, place, and time.  Psychiatric:        Mood and Affect: Mood normal.        Thought Content: Thought content normal.        Last CBC Lab Results  Component Value Date   WBC 8.2 06/13/2024   HGB 9.9 (L) 06/13/2024   HCT 30.9 (L) 06/13/2024   MCV 91 06/13/2024   MCH 29.2 06/13/2024   RDW 15.2 06/13/2024   PLT 352 06/13/2024   Last metabolic panel Lab Results  Component Value Date   GLUCOSE 63 (L) 06/13/2024   NA 139 06/13/2024   K 4.0 06/13/2024   CL 104 06/13/2024   CO2 21 06/13/2024   BUN 17 06/13/2024   CREATININE 0.55 (L) 06/13/2024   GFRNONAA >60 03/20/2024   CALCIUM 8.8 06/13/2024   PROT 6.6 03/20/2024   ALBUMIN 4.4 03/20/2024   LABGLOB 1.6 02/05/2024   BILITOT 0.8 03/20/2024   ALKPHOS 102 03/20/2024   AST 22 03/20/2024   ALT 16 03/20/2024   ANIONGAP 10 03/20/2024   Last lipids Lab Results  Component Value Date   CHOL 165 02/05/2024   HDL 62 02/05/2024   LDLCALC 90 02/05/2024   TRIG 68 02/05/2024   CHOLHDL 2.7 02/05/2024   Last  hemoglobin A1c Lab Results  Component Value Date   HGBA1C <4.2 (L) 01/04/2023   Last thyroid functions Lab Results  Component Value Date   TSH 13.200 (H) 06/13/2024   FREET4 1.51 06/13/2024   Last vitamin D  Lab Results  Component Value Date   VD25OH 51.9 02/05/2024   Last vitamin B12 and Folate Lab Results  Component Value Date   VITAMINB12 1,399 (H) 04/03/2024   FOLATE >20.0 04/03/2024      The 10-year ASCVD risk score (Arnett DK, et al., 2019) is: 13.1%    Assessment & Plan:   Problem List Items Addressed This Visit       Cardiovascular and Mediastinum   Essential hypertension - Primary   Previously uncontrolled blood pressure now stabilized with hydralazine  and losartan . - Refilled hydralazine  prescription. - Refilled losartan  prescription.      Relevant Medications  hydrALAZINE  (APRESOLINE ) 10 MG tablet   losartan  (COZAAR ) 50 MG tablet   simvastatin  (ZOCOR ) 10 MG tablet   verapamil  (VERELAN ) 240 MG 24 hr capsule   Other Relevant Orders   Basic Metabolic Panel (BMET) (Completed)     Endocrine   Hypothyroidism   Thyroid dysfunction suspected to contribute to weight loss. - Rechecked thyroid function tests today.      Relevant Orders   TSH + free T4 (Completed)     Other   Hyperlipidemia   Currently prescribed simvastatin  10 mg daily -Repeat lipid panel today. -Follow-up according to lab results      Relevant Medications   hydrALAZINE  (APRESOLINE ) 10 MG tablet   losartan  (COZAAR ) 50 MG tablet   simvastatin  (ZOCOR ) 10 MG tablet   verapamil  (VERELAN ) 240 MG 24 hr capsule   Insomnia   Currently prescribed trazodone  150 mg nightly, which she feels is effective.  Refills provided.       Relevant Medications   traZODone  (DESYREL ) 150 MG tablet   Anemia   Significant weight loss and anemia post-bypass surgery. Possible cancer recurrence or thyroid dysfunction considered. - Ordered CT and PET scans before May 13th to evaluate for cancer  recurrence. - Rechecked thyroid function tests today.       Relevant Orders   CBC with Differential/Platelet (Completed)   Fe+TIBC+Fer (Completed)   No follow-ups on file.    Leita Longs, FNP  "

## 2024-06-14 LAB — CBC WITH DIFFERENTIAL/PLATELET
Basophils Absolute: 0.1 x10E3/uL (ref 0.0–0.2)
Basos: 1 %
EOS (ABSOLUTE): 0.3 x10E3/uL (ref 0.0–0.4)
Eos: 3 %
Hematocrit: 30.9 % — ABNORMAL LOW (ref 34.0–46.6)
Hemoglobin: 9.9 g/dL — ABNORMAL LOW (ref 11.1–15.9)
Immature Grans (Abs): 0 x10E3/uL (ref 0.0–0.1)
Immature Granulocytes: 0 %
Lymphocytes Absolute: 1.2 x10E3/uL (ref 0.7–3.1)
Lymphs: 14 %
MCH: 29.2 pg (ref 26.6–33.0)
MCHC: 32 g/dL (ref 31.5–35.7)
MCV: 91 fL (ref 79–97)
Monocytes Absolute: 1 x10E3/uL — ABNORMAL HIGH (ref 0.1–0.9)
Monocytes: 12 %
Neutrophils Absolute: 5.6 x10E3/uL (ref 1.4–7.0)
Neutrophils: 69 %
Platelets: 352 x10E3/uL (ref 150–450)
RBC: 3.39 x10E6/uL — ABNORMAL LOW (ref 3.77–5.28)
RDW: 15.2 % (ref 11.7–15.4)
WBC: 8.2 x10E3/uL (ref 3.4–10.8)

## 2024-06-14 LAB — IRON,TIBC AND FERRITIN PANEL
Ferritin: 265 ng/mL — ABNORMAL HIGH (ref 15–150)
Iron Saturation: 17 % (ref 15–55)
Iron: 44 ug/dL (ref 27–139)
Total Iron Binding Capacity: 259 ug/dL (ref 250–450)
UIBC: 215 ug/dL (ref 118–369)

## 2024-06-14 LAB — BASIC METABOLIC PANEL WITH GFR
BUN/Creatinine Ratio: 31 — ABNORMAL HIGH (ref 12–28)
BUN: 17 mg/dL (ref 8–27)
CO2: 21 mmol/L (ref 20–29)
Calcium: 8.8 mg/dL (ref 8.7–10.3)
Chloride: 104 mmol/L (ref 96–106)
Creatinine, Ser: 0.55 mg/dL — ABNORMAL LOW (ref 0.57–1.00)
Glucose: 63 mg/dL — ABNORMAL LOW (ref 70–99)
Potassium: 4 mmol/L (ref 3.5–5.2)
Sodium: 139 mmol/L (ref 134–144)
eGFR: 99 mL/min/1.73

## 2024-06-14 LAB — TSH+FREE T4
Free T4: 1.51 ng/dL (ref 0.82–1.77)
TSH: 13.2 u[IU]/mL — ABNORMAL HIGH (ref 0.450–4.500)

## 2024-06-18 ENCOUNTER — Other Ambulatory Visit: Payer: Self-pay

## 2024-06-18 ENCOUNTER — Telehealth: Payer: Self-pay

## 2024-06-18 ENCOUNTER — Ambulatory Visit: Payer: Self-pay

## 2024-06-18 DIAGNOSIS — E039 Hypothyroidism, unspecified: Secondary | ICD-10-CM

## 2024-06-18 DIAGNOSIS — D649 Anemia, unspecified: Secondary | ICD-10-CM

## 2024-06-18 MED ORDER — LEVOTHYROXINE SODIUM 137 MCG PO TABS: 137.0000 ug | ORAL_TABLET | Freq: Every day | ORAL | 1 refills | Status: AC

## 2024-06-18 NOTE — Telephone Encounter (Signed)
 Copied from CRM #[id number]. Topic: Clinical - Lab/Test Results >> Jun 14, 2024  4:10 PM Gattis SQUIBB wrote: Reason for CRM: Pt wants to talk about her TSH level  [phone]

## 2024-06-18 NOTE — Telephone Encounter (Signed)
 Please inform patient that I am out of the office, but I did send her a message through MyChart.

## 2024-06-18 NOTE — Telephone Encounter (Signed)
 Message sent to patient.

## 2024-06-20 DIAGNOSIS — D649 Anemia, unspecified: Secondary | ICD-10-CM | POA: Insufficient documentation

## 2024-06-20 NOTE — Assessment & Plan Note (Signed)
 Currently prescribed simvastatin  10 mg daily -Repeat lipid panel today. -Follow-up according to lab results

## 2024-06-20 NOTE — Assessment & Plan Note (Signed)
 Thyroid dysfunction suspected to contribute to weight loss. - Rechecked thyroid function tests today.

## 2024-06-20 NOTE — Assessment & Plan Note (Signed)
 Previously uncontrolled blood pressure now stabilized with hydralazine  and losartan . - Refilled hydralazine  prescription. - Refilled losartan  prescription.

## 2024-06-20 NOTE — Assessment & Plan Note (Addendum)
 Significant weight loss and anemia post-bypass surgery. Possible cancer recurrence or thyroid dysfunction considered. - Ordered CT and PET scans before May 13th to evaluate for cancer recurrence. - Rechecked thyroid function tests today.

## 2024-06-20 NOTE — Assessment & Plan Note (Addendum)
 Currently prescribed trazodone  150 mg nightly, which she feels is effective.  Refills provided.

## 2024-07-11 ENCOUNTER — Ambulatory Visit: Admitting: Internal Medicine

## 2024-08-02 ENCOUNTER — Ambulatory Visit: Payer: Self-pay

## 2024-09-04 ENCOUNTER — Other Ambulatory Visit (HOSPITAL_COMMUNITY)

## 2024-09-11 ENCOUNTER — Ambulatory Visit: Payer: Self-pay

## 2024-10-02 ENCOUNTER — Other Ambulatory Visit (HOSPITAL_COMMUNITY)

## 2024-10-02 ENCOUNTER — Inpatient Hospital Stay

## 2024-10-09 ENCOUNTER — Inpatient Hospital Stay: Admitting: Oncology

## 2024-10-23 ENCOUNTER — Ambulatory Visit

## 2024-10-25 ENCOUNTER — Ambulatory Visit
